# Patient Record
Sex: Female | Born: 1940 | Race: Black or African American | Hispanic: No | Smoking: Former smoker
Health system: Southern US, Community
[De-identification: ages and names within clinical notes are randomized; demographics above are authoritative.]

## PROBLEM LIST (undated history)

## (undated) DIAGNOSIS — N3010 Interstitial cystitis (chronic) without hematuria: Secondary | ICD-10-CM

## (undated) DIAGNOSIS — T8859XA Other complications of anesthesia, initial encounter: Secondary | ICD-10-CM

## (undated) DIAGNOSIS — T4145XA Adverse effect of unspecified anesthetic, initial encounter: Secondary | ICD-10-CM

## (undated) DIAGNOSIS — E162 Hypoglycemia, unspecified: Secondary | ICD-10-CM

## (undated) DIAGNOSIS — S838X2A Sprain of other specified parts of left knee, initial encounter: Secondary | ICD-10-CM

## (undated) DIAGNOSIS — N329 Bladder disorder, unspecified: Secondary | ICD-10-CM

## (undated) DIAGNOSIS — F411 Generalized anxiety disorder: Secondary | ICD-10-CM

## (undated) DIAGNOSIS — R413 Other amnesia: Secondary | ICD-10-CM

## (undated) DIAGNOSIS — I10 Essential (primary) hypertension: Secondary | ICD-10-CM

## (undated) DIAGNOSIS — R0600 Dyspnea, unspecified: Secondary | ICD-10-CM

## (undated) DIAGNOSIS — G459 Transient cerebral ischemic attack, unspecified: Secondary | ICD-10-CM

## (undated) DIAGNOSIS — G4733 Obstructive sleep apnea (adult) (pediatric): Secondary | ICD-10-CM

## (undated) DIAGNOSIS — G4730 Sleep apnea, unspecified: Secondary | ICD-10-CM

## (undated) DIAGNOSIS — K219 Gastro-esophageal reflux disease without esophagitis: Secondary | ICD-10-CM

## (undated) DIAGNOSIS — K5792 Diverticulitis of intestine, part unspecified, without perforation or abscess without bleeding: Secondary | ICD-10-CM

## (undated) DIAGNOSIS — M25462 Effusion, left knee: Secondary | ICD-10-CM

## (undated) DIAGNOSIS — F329 Major depressive disorder, single episode, unspecified: Secondary | ICD-10-CM

## (undated) DIAGNOSIS — K5900 Constipation, unspecified: Secondary | ICD-10-CM

## (undated) HISTORY — PX: TOTAL ABDOMINAL HYSTERECTOMY W/ BILATERAL SALPINGOOPHORECTOMY: SHX83

## (undated) HISTORY — DX: Major depressive disorder, single episode, unspecified: F32.9

## (undated) HISTORY — DX: Morbid (severe) obesity due to excess calories: E66.01

## (undated) HISTORY — DX: Generalized anxiety disorder: F41.1

## (undated) HISTORY — DX: Bladder disorder, unspecified: N32.9

## (undated) HISTORY — DX: Transient cerebral ischemic attack, unspecified: G45.9

## (undated) HISTORY — PX: HEMORROIDECTOMY: SUR656

## (undated) HISTORY — DX: Other amnesia: R41.3

## (undated) HISTORY — PX: TONSILLECTOMY AND ADENOIDECTOMY: SUR1326

## (undated) HISTORY — DX: Obstructive sleep apnea (adult) (pediatric): G47.33

## (undated) HISTORY — PX: LAPAROSCOPIC LYSIS OF ADHESIONS: SHX5905

---

## 1978-09-29 HISTORY — PX: LAPAROSCOPIC LYSIS OF ADHESIONS: SHX5905

## 1999-01-30 ENCOUNTER — Ambulatory Visit (HOSPITAL_COMMUNITY): Admission: RE | Admit: 1999-01-30 | Discharge: 1999-01-30 | Payer: Self-pay | Admitting: Internal Medicine

## 1999-06-08 ENCOUNTER — Ambulatory Visit (HOSPITAL_COMMUNITY): Admission: RE | Admit: 1999-06-08 | Discharge: 1999-06-08 | Payer: Self-pay | Admitting: Internal Medicine

## 1999-06-08 ENCOUNTER — Encounter: Payer: Self-pay | Admitting: Internal Medicine

## 2001-04-20 ENCOUNTER — Encounter: Admission: RE | Admit: 2001-04-20 | Discharge: 2001-04-20 | Payer: Self-pay | Admitting: Internal Medicine

## 2001-04-20 ENCOUNTER — Encounter: Payer: Self-pay | Admitting: Internal Medicine

## 2003-01-21 ENCOUNTER — Emergency Department (HOSPITAL_COMMUNITY): Admission: AD | Admit: 2003-01-21 | Discharge: 2003-01-21 | Payer: Self-pay | Admitting: Emergency Medicine

## 2003-03-22 ENCOUNTER — Emergency Department (HOSPITAL_COMMUNITY): Admission: EM | Admit: 2003-03-22 | Discharge: 2003-03-22 | Payer: Self-pay | Admitting: Family Medicine

## 2005-03-21 ENCOUNTER — Encounter: Admission: RE | Admit: 2005-03-21 | Discharge: 2005-03-21 | Payer: Self-pay | Admitting: Internal Medicine

## 2005-03-27 ENCOUNTER — Encounter: Admission: RE | Admit: 2005-03-27 | Discharge: 2005-03-27 | Payer: Self-pay | Admitting: Orthopedic Surgery

## 2007-01-29 HISTORY — PX: KNEE ARTHROSCOPY: SUR90

## 2007-01-30 ENCOUNTER — Encounter: Admission: RE | Admit: 2007-01-30 | Discharge: 2007-01-30 | Payer: Self-pay | Admitting: Internal Medicine

## 2012-07-04 DIAGNOSIS — N3010 Interstitial cystitis (chronic) without hematuria: Secondary | ICD-10-CM | POA: Insufficient documentation

## 2012-08-14 DIAGNOSIS — R1084 Generalized abdominal pain: Secondary | ICD-10-CM | POA: Insufficient documentation

## 2012-09-01 DIAGNOSIS — R1032 Left lower quadrant pain: Secondary | ICD-10-CM | POA: Insufficient documentation

## 2012-09-25 ENCOUNTER — Other Ambulatory Visit: Payer: Self-pay | Admitting: Orthopedic Surgery

## 2012-09-25 NOTE — Progress Notes (Signed)
Preoperative surgical orders have been place into the [facility] hospital system for Marissa Dodson on 09/25/2012, 12:41 PM  by Patrica Duel for surgery on 10/09/2012.  Preop Knee Scope orders including IV Tylenol and IV Decadron as long as there are no contraindications to the above medications. Avel Peace, PA-C

## 2012-10-01 ENCOUNTER — Encounter (HOSPITAL_BASED_OUTPATIENT_CLINIC_OR_DEPARTMENT_OTHER): Payer: Self-pay | Admitting: *Deleted

## 2012-10-02 ENCOUNTER — Encounter (HOSPITAL_BASED_OUTPATIENT_CLINIC_OR_DEPARTMENT_OTHER): Payer: Self-pay | Admitting: *Deleted

## 2012-10-02 DIAGNOSIS — G4700 Insomnia, unspecified: Secondary | ICD-10-CM | POA: Insufficient documentation

## 2012-10-02 NOTE — Progress Notes (Signed)
NPO AFTER MN WITH EXCEPTION CLEAR LIQUIDS UNTIL 0730 (NO CREAM/ MILK PRODUCTS). ARRIVES AT 1145. NEEDS ISTAT AND EKG. WILL TAKE PRILOSEC AM OF SURG W/ SIP OF WATER. PT WILL NOT TAKE ATENOLOL AM OF SURG DUE TO ISSUE W/ HYPOTENTION W/ ANES.Marland Kitchen  PER PT. (PT RETIRED RN)

## 2012-10-05 ENCOUNTER — Encounter (HOSPITAL_BASED_OUTPATIENT_CLINIC_OR_DEPARTMENT_OTHER): Payer: Self-pay | Admitting: *Deleted

## 2012-10-05 NOTE — Progress Notes (Signed)
10/02/12 1009  OBSTRUCTIVE SLEEP APNEA  Have you ever been diagnosed with sleep apnea through a sleep study? No  Do you snore loudly (loud enough to be heard through closed doors)?  1  Do you often feel tired, fatigued, or sleepy during the daytime? 1  Has anyone observed you stop breathing during your sleep? 0  Do you have, or are you being treated for high blood pressure? 1  BMI more than 35 kg/m2? 1  Age over 71 years old? 1  Gender: 0  Obstructive Sleep Apnea Score 5

## 2012-10-08 DIAGNOSIS — S83249A Other tear of medial meniscus, current injury, unspecified knee, initial encounter: Secondary | ICD-10-CM

## 2012-10-08 NOTE — H&P (Signed)
  CC- Marissa Dodson is a 72 y.o. female who presents with left knee pain.  HPI- . Knee Pain: Patient presents with knee pain involving the  left knee. Onset of the symptoms was several weeks ago. Inciting event: twisting injury while getting out of car. Current symptoms include giving out, pain located medially and stiffness. Pain is aggravated by kneeling, lateral movements, pivoting, rising after sitting and squatting.  Patient has had no prior knee problems. Evaluation to date: MRI: abnormal medial meniscal tear. Treatment to date: corticosteroid injection which was ineffective.  Past Medical History  Diagnosis Date  . Acute meniscal injury of left knee   . Hypertension   . Constipation   . IC (interstitial cystitis)   . Swelling of left knee joint   . Complication of anesthesia     hypotention  . GERD (gastroesophageal reflux disease)   . Hypoglycemia, unspecified   . Sleep apnea     STOP-Bang =  5    Past Surgical History  Procedure Laterality Date  . Knee arthroscopy Right 2009  . Total abdominal hysterectomy w/ bilateral salpingoophorectomy  AGE 24  . Laparoscopic lysis of adhesions  1980'S  . Tonsillectomy and adenoidectomy  AGE 24    Prior to Admission medications   Medication Sig Start Date End Date Taking? Authorizing Provider  amitriptyline (ELAVIL) 25 MG tablet Take 50 mg by mouth at bedtime.   Yes Historical Provider, MD  aspirin 81 MG tablet Take 81 mg by mouth daily.   Yes Historical Provider, MD  atenolol (TENORMIN) 25 MG tablet Take 25 mg by mouth every morning.   Yes Historical Provider, MD  CASCARA SAGRADA PO Take by mouth daily.   Yes Historical Provider, MD  cetirizine (ZYRTEC) 10 MG tablet Take 10 mg by mouth daily.   Yes Historical Provider, MD  Cholecalciferol (VITAMIN D3) 2000 UNITS TABS Take 1 capsule by mouth daily.   Yes Historical Provider, MD  dicyclomine (BENTYL) 10 MG capsule Take 10 mg by mouth 3 (three) times daily as needed.   Yes Historical  Provider, MD  furosemide (LASIX) 40 MG tablet Take 20-40 mg by mouth daily. Takes 20mg  daily if increased fluid retention takes 40mg    Yes Historical Provider, MD  ibuprofen (ADVIL,MOTRIN) 200 MG tablet Take 600 mg by mouth 2 (two) times daily. And prn   Yes Historical Provider, MD  omeprazole (PRILOSEC) 40 MG capsule Take 40 mg by mouth every morning.   Yes Historical Provider, MD  potassium chloride SA (K-DUR,KLOR-CON) 20 MEQ tablet Take 10-20 mEq by mouth daily. Takes daily and if taking 40mg  lasix will take   Yes Historical Provider, MD   Knee Exam antalgic gait, soft tissue tenderness over medial joint line, no effusion, exam limited by acuity of pain, negative pivot-shift, collateral ligaments intact  Physical Examination: General appearance - alert, well appearing, and in no distress Mental status - alert, oriented to person, place, and time Chest - clear to auscultation, no wheezes, rales or rhonchi, symmetric air entry Heart - normal rate, regular rhythm, normal S1, S2, no murmurs, rubs, clicks or gallops Abdomen - soft, nontender, nondistended, no masses or organomegaly Musculoskeletal - no joint tenderness, deformity or swelling   Asessment/Plan--- Left knee medial meniscal tear- - Plan left knee arthroscopy with meniscal debridement. Procedure risks and potential comps discussed with patient who elects to proceed. Goals are decreased pain and increased function with a high likelihood of achieving both

## 2012-10-09 ENCOUNTER — Ambulatory Visit (HOSPITAL_BASED_OUTPATIENT_CLINIC_OR_DEPARTMENT_OTHER)
Admission: RE | Admit: 2012-10-09 | Discharge: 2012-10-09 | Disposition: A | Discharge status: Home / Self Care | Payer: Medicare Other | Source: Ambulatory Visit | Attending: Orthopedic Surgery | Admitting: Orthopedic Surgery

## 2012-10-09 ENCOUNTER — Encounter (HOSPITAL_BASED_OUTPATIENT_CLINIC_OR_DEPARTMENT_OTHER): Payer: Self-pay | Admitting: *Deleted

## 2012-10-09 ENCOUNTER — Other Ambulatory Visit: Payer: Self-pay

## 2012-10-09 ENCOUNTER — Encounter (HOSPITAL_BASED_OUTPATIENT_CLINIC_OR_DEPARTMENT_OTHER)
Admission: RE | Disposition: A | Discharge status: Home / Self Care | Payer: Self-pay | Source: Ambulatory Visit | Attending: Orthopedic Surgery

## 2012-10-09 ENCOUNTER — Ambulatory Visit (HOSPITAL_BASED_OUTPATIENT_CLINIC_OR_DEPARTMENT_OTHER): Payer: Medicare Other | Admitting: Anesthesiology

## 2012-10-09 ENCOUNTER — Encounter (HOSPITAL_BASED_OUTPATIENT_CLINIC_OR_DEPARTMENT_OTHER): Payer: Self-pay | Admitting: Anesthesiology

## 2012-10-09 DIAGNOSIS — Z7982 Long term (current) use of aspirin: Secondary | ICD-10-CM | POA: Insufficient documentation

## 2012-10-09 DIAGNOSIS — S83242A Other tear of medial meniscus, current injury, left knee, initial encounter: Secondary | ICD-10-CM

## 2012-10-09 DIAGNOSIS — Z79899 Other long term (current) drug therapy: Secondary | ICD-10-CM | POA: Insufficient documentation

## 2012-10-09 DIAGNOSIS — Z01818 Encounter for other preprocedural examination: Secondary | ICD-10-CM | POA: Insufficient documentation

## 2012-10-09 DIAGNOSIS — Z01810 Encounter for preprocedural cardiovascular examination: Secondary | ICD-10-CM | POA: Insufficient documentation

## 2012-10-09 DIAGNOSIS — Z01812 Encounter for preprocedural laboratory examination: Secondary | ICD-10-CM | POA: Insufficient documentation

## 2012-10-09 DIAGNOSIS — M2240 Chondromalacia patellae, unspecified knee: Secondary | ICD-10-CM | POA: Insufficient documentation

## 2012-10-09 DIAGNOSIS — E162 Hypoglycemia, unspecified: Secondary | ICD-10-CM | POA: Insufficient documentation

## 2012-10-09 DIAGNOSIS — X500XXA Overexertion from strenuous movement or load, initial encounter: Secondary | ICD-10-CM | POA: Insufficient documentation

## 2012-10-09 DIAGNOSIS — S83249A Other tear of medial meniscus, current injury, unspecified knee, initial encounter: Secondary | ICD-10-CM

## 2012-10-09 DIAGNOSIS — IMO0002 Reserved for concepts with insufficient information to code with codable children: Secondary | ICD-10-CM | POA: Insufficient documentation

## 2012-10-09 HISTORY — DX: Hypoglycemia, unspecified: E16.2

## 2012-10-09 HISTORY — DX: Adverse effect of unspecified anesthetic, initial encounter: T41.45XA

## 2012-10-09 HISTORY — PX: KNEE ARTHROSCOPY: SHX127

## 2012-10-09 HISTORY — DX: Constipation, unspecified: K59.00

## 2012-10-09 HISTORY — DX: Sleep apnea, unspecified: G47.30

## 2012-10-09 HISTORY — DX: Sprain of other specified parts of left knee, initial encounter: S83.8X2A

## 2012-10-09 HISTORY — DX: Essential (primary) hypertension: I10

## 2012-10-09 HISTORY — DX: Interstitial cystitis (chronic) without hematuria: N30.10

## 2012-10-09 HISTORY — DX: Other complications of anesthesia, initial encounter: T88.59XA

## 2012-10-09 HISTORY — DX: Effusion, left knee: M25.462

## 2012-10-09 HISTORY — DX: Gastro-esophageal reflux disease without esophagitis: K21.9

## 2012-10-09 LAB — POCT I-STAT 4, (NA,K, GLUC, HGB,HCT)
HCT: 35 % — ABNORMAL LOW (ref 36.0–46.0)
Hemoglobin: 11.9 g/dL — ABNORMAL LOW (ref 12.0–15.0)

## 2012-10-09 SURGERY — ARTHROSCOPY, KNEE
Anesthesia: General | Site: Knee | Laterality: Left | Wound class: Clean

## 2012-10-09 MED ORDER — LACTATED RINGERS IV SOLN
INTRAVENOUS | Status: DC
  Filled 2012-10-09: qty 1000

## 2012-10-09 MED ORDER — DEXTROSE 5 % IV SOLN
3.0000 g | INTRAVENOUS | Status: DC
  Filled 2012-10-09: qty 3000

## 2012-10-09 MED ORDER — SODIUM CHLORIDE 0.9 % IR SOLN
Status: DC | PRN
  Administered 2012-10-09: 9000 mL

## 2012-10-09 MED ORDER — BUPIVACAINE-EPINEPHRINE 0.25% -1:200000 IJ SOLN
INTRAMUSCULAR | Status: DC | PRN
  Administered 2012-10-09: 20 mL

## 2012-10-09 MED ORDER — METHOCARBAMOL 500 MG PO TABS: 500.0000 mg | ORAL_TABLET | Freq: Four times a day (QID) | ORAL | Status: DC

## 2012-10-09 MED ORDER — LIDOCAINE HCL (CARDIAC) 20 MG/ML IV SOLN
INTRAVENOUS | Status: DC | PRN
  Administered 2012-10-09: 100 mg via INTRAVENOUS

## 2012-10-09 MED ORDER — MIDAZOLAM HCL 5 MG/5ML IJ SOLN
INTRAMUSCULAR | Status: DC | PRN
  Administered 2012-10-09 (×2): 1 mg via INTRAVENOUS

## 2012-10-09 MED ORDER — CHLORHEXIDINE GLUCONATE 4 % EX LIQD
60.0000 mL | Freq: Once | CUTANEOUS | Status: AC
  Administered 2012-10-09: 4 via TOPICAL
  Filled 2012-10-09: qty 60

## 2012-10-09 MED ORDER — DEXAMETHASONE SODIUM PHOSPHATE 10 MG/ML IJ SOLN
10.0000 mg | Freq: Once | INTRAMUSCULAR | Status: AC
  Administered 2012-10-09: 10 mg via INTRAVENOUS
  Filled 2012-10-09: qty 1

## 2012-10-09 MED ORDER — OXYCODONE HCL 5 MG PO TABS: 5.0000 mg | ORAL_TABLET | ORAL | Status: DC | PRN

## 2012-10-09 MED ORDER — FENTANYL CITRATE 0.05 MG/ML IJ SOLN
25.0000 ug | INTRAMUSCULAR | Status: DC | PRN
  Administered 2012-10-09 (×4): 25 ug via INTRAVENOUS
  Filled 2012-10-09: qty 1

## 2012-10-09 MED ORDER — FENTANYL CITRATE 0.05 MG/ML IJ SOLN
INTRAMUSCULAR | Status: DC | PRN
  Administered 2012-10-09 (×3): 50 ug via INTRAVENOUS

## 2012-10-09 MED ORDER — METOPROLOL TARTRATE 1 MG/ML IV SOLN
INTRAVENOUS | Status: DC | PRN
  Administered 2012-10-09: 2 mg via INTRAVENOUS
  Administered 2012-10-09: 3 mg via INTRAVENOUS

## 2012-10-09 MED ORDER — PROPOFOL 10 MG/ML IV BOLUS
INTRAVENOUS | Status: DC | PRN
  Administered 2012-10-09: 200 mg via INTRAVENOUS

## 2012-10-09 MED ORDER — ACETAMINOPHEN 10 MG/ML IV SOLN
1000.0000 mg | Freq: Once | INTRAVENOUS | Status: AC
  Administered 2012-10-09: 1000 mg via INTRAVENOUS
  Filled 2012-10-09: qty 100

## 2012-10-09 MED ORDER — SODIUM CHLORIDE 0.9 % IV SOLN
INTRAVENOUS | Status: DC
  Filled 2012-10-09: qty 1000

## 2012-10-09 MED ORDER — METOCLOPRAMIDE HCL 5 MG/ML IJ SOLN
INTRAMUSCULAR | Status: DC | PRN
  Administered 2012-10-09: 10 mg via INTRAVENOUS

## 2012-10-09 MED ORDER — VANCOMYCIN HCL IN DEXTROSE 1-5 GM/200ML-% IV SOLN
1000.0000 mg | Freq: Once | INTRAVENOUS | Status: AC
  Administered 2012-10-09: 1000 mg via INTRAVENOUS
  Filled 2012-10-09: qty 200

## 2012-10-09 MED ORDER — METHOCARBAMOL 500 MG PO TABS
500.0000 mg | ORAL_TABLET | Freq: Four times a day (QID) | ORAL | Status: DC | PRN
  Administered 2012-10-09: 500 mg via ORAL
  Filled 2012-10-09: qty 1

## 2012-10-09 MED ORDER — LACTATED RINGERS IV SOLN
INTRAVENOUS | Status: DC
  Administered 2012-10-09 (×2): via INTRAVENOUS
  Filled 2012-10-09: qty 1000

## 2012-10-09 MED ORDER — ONDANSETRON HCL 4 MG/2ML IJ SOLN
INTRAMUSCULAR | Status: DC | PRN
  Administered 2012-10-09: 4 mg via INTRAVENOUS

## 2012-10-09 SURGICAL SUPPLY — 36 items
BANDAGE ELASTIC 6 VELCRO ST LF (GAUZE/BANDAGES/DRESSINGS) ×2 IMPLANT
BLADE 4.2CUDA (BLADE) ×2 IMPLANT
BLADE CUDA SHAVER 3.5 (BLADE) IMPLANT
BLADE CUTTER GATOR 3.5 (BLADE) IMPLANT
CANISTER SUCT LVC 12 LTR MEDI- (MISCELLANEOUS) ×2 IMPLANT
CANISTER SUCTION 2500CC (MISCELLANEOUS) IMPLANT
CLOTH BEACON ORANGE TIMEOUT ST (SAFETY) ×2 IMPLANT
CUFF TOURN SGL QUICK 34 (TOURNIQUET CUFF) ×1
CUFF TRNQT CYL 34X4X40X1 (TOURNIQUET CUFF) ×1 IMPLANT
DRAPE ARTHROSCOPY W/POUCH 114 (DRAPES) ×2 IMPLANT
DRSG EMULSION OIL 3X3 NADH (GAUZE/BANDAGES/DRESSINGS) ×2 IMPLANT
DURAPREP 26ML APPLICATOR (WOUND CARE) ×2 IMPLANT
ELECT MENISCUS 165MM 90D (ELECTRODE) IMPLANT
ELECT REM PT RETURN 9FT ADLT (ELECTROSURGICAL)
ELECTRODE REM PT RTRN 9FT ADLT (ELECTROSURGICAL) IMPLANT
GLOVE BIO SURGEON STRL SZ8 (GLOVE) ×2 IMPLANT
GLOVE BIOGEL PI IND STRL 7.5 (GLOVE) ×3 IMPLANT
GLOVE BIOGEL PI INDICATOR 7.5 (GLOVE) ×3
GLOVE INDICATOR 8.0 STRL GRN (GLOVE) ×2 IMPLANT
GOWN PREVENTION PLUS LG XLONG (DISPOSABLE) ×4 IMPLANT
IV NS IRRIG 3000ML ARTHROMATIC (IV SOLUTION) ×6 IMPLANT
KNEE WRAP E Z 3 GEL PACK (MISCELLANEOUS) ×2 IMPLANT
PACK ARTHROSCOPY DSU (CUSTOM PROCEDURE TRAY) ×2 IMPLANT
PACK BASIN DAY SURGERY FS (CUSTOM PROCEDURE TRAY) ×2 IMPLANT
PADDING CAST ABS 4INX4YD NS (CAST SUPPLIES) ×1
PADDING CAST ABS COTTON 4X4 ST (CAST SUPPLIES) ×1 IMPLANT
PADDING CAST COTTON 6X4 STRL (CAST SUPPLIES) ×2 IMPLANT
PENCIL BUTTON HOLSTER BLD 10FT (ELECTRODE) IMPLANT
SET ARTHROSCOPY TUBING (MISCELLANEOUS) ×1
SET ARTHROSCOPY TUBING LN (MISCELLANEOUS) ×1 IMPLANT
SPONGE GAUZE 4X4 12PLY (GAUZE/BANDAGES/DRESSINGS) ×2 IMPLANT
SUT ETHILON 4 0 PS 2 18 (SUTURE) ×2 IMPLANT
TOWEL OR 17X24 6PK STRL BLUE (TOWEL DISPOSABLE) ×2 IMPLANT
WAND 30 DEG SABER W/CORD (SURGICAL WAND) IMPLANT
WAND 90 DEG TURBOVAC W/CORD (SURGICAL WAND) IMPLANT
WATER STERILE IRR 500ML POUR (IV SOLUTION) ×2 IMPLANT

## 2012-10-09 NOTE — Op Note (Signed)
Preoperative diagnosis-  Left knee medial meniscal tear  Postoperative diagnosis Left- knee medial meniscal tear   Procedure- Left knee arthroscopy with medial meniscal debridement    Surgeon- Gus Rankin. Savaya Hakes, MD  Anesthesia-General  EBL-  minimal Complications- None  Condition- PACU - hemodynamically stable.  Brief clinical note- -[name] is a 72 y.o.  female with a several week history of left knee pain and mechanical symptoms. Exam and history suggested medial meniscal tear confirmed by MRI. The patient presents now for arthroscopy and debridement   Procedure in detail -       After successful administration of General anesthetic, a tourmiquet is placed high on the Left  thigh and the Left lower extremity is prepped and draped in the usual sterile fashion. Time out is performed by the surgical team. Standard superomedial and inferolateral portal sites are marked and incisions made with an 11 blade. The inflow cannula is passed through the superomedial portal and camera through the inferolateral portal and inflow is initiated. Arthroscopic visualization proceeds.      The undersurface of the patella and trochlea are visualized and there is mild chondromalacia but no chondral defect. The medial and lateral gutters are visualized and there are  no loose bodies. Flexion and valgus force is applied to the knee and the medial compartment is entered. A spinal needle is passed into the joint through the site marked for the inferomedial portal. A small incision is made and the dilator passed into the joint. The findings for the medial compartment are grade II chondromalacia medial femoral condyle without any unstable defects and an unstable tear of the body and posterior horn of the medial meniscus. The tear is debrided to a stable base with baskets and a shaver and sealed off with the Arthrocare.      The intercondylar notch is visualized and the ACL appears normal. The lateral  compartment is entered and the findings are normal .      The joint is again inspected and there are no other tears, defects or loose bodies identified. The arthroscopic equipment is then removed from the inferior portals which are closed with interrupted 4-0 nylon. 20 ml of .25% Marcaine with epinephrine are injected through the inflow cannula and the cannula is then removed and the portal closed with nylon. The incisions are cleaned and dried and a bulky sterile dressing is applied. The patient is then awakened and transported to recovery in stable condition.   10/09/2012, 2:03 PM

## 2012-10-09 NOTE — Anesthesia Preprocedure Evaluation (Signed)
Anesthesia Evaluation  Patient identified by MRN, date of birth, ID band Patient awake    Reviewed: Allergy & Precautions, H&P , NPO status , Patient's Chart, lab work & pertinent test results  History of Anesthesia Complications (+) PONV  Airway Mallampati: II TM Distance: >3 FB Neck ROM: Full    Dental  (+) Teeth Intact and Dental Advisory Given   Pulmonary neg pulmonary ROS,  breath sounds clear to auscultation  Pulmonary exam normal       Cardiovascular hypertension, Pt. on medications and Pt. on home beta blockers negative cardio ROS  Rhythm:Regular Rate:Normal     Neuro/Psych negative neurological ROS  negative psych ROS   GI/Hepatic Neg liver ROS, GERD-  ,  Endo/Other  negative endocrine ROS  Renal/GU negative Renal ROS  negative genitourinary   Musculoskeletal negative musculoskeletal ROS (+)   Abdominal   Peds  Hematology negative hematology ROS (+)   Anesthesia Other Findings   Reproductive/Obstetrics                           Anesthesia Physical Anesthesia Plan  ASA: II  Anesthesia Plan: General   Post-op Pain Management:    Induction: Intravenous  Airway Management Planned:   Additional Equipment:   Intra-op Plan:   Post-operative Plan: Extubation in OR  Informed Consent: I have reviewed the patients History and Physical, chart, labs and discussed the procedure including the risks, benefits and alternatives for the proposed anesthesia with the patient or authorized representative who has indicated his/her understanding and acceptance.   Dental advisory given  Plan Discussed with: CRNA  Anesthesia Plan Comments:         Anesthesia Quick Evaluation

## 2012-10-09 NOTE — Anesthesia Procedure Notes (Signed)
Procedure Name: Intubation Date/Time: 10/09/2012 1:20 PM Performed by: Norva Pavlov Pre-anesthesia Checklist: Patient identified, Emergency Drugs available, Suction available and Patient being monitored Patient Re-evaluated:Patient Re-evaluated prior to inductionOxygen Delivery Method: Circle System Utilized Preoxygenation: Pre-oxygenation with 100% oxygen Intubation Type: IV induction Ventilation: Mask ventilation without difficulty Laryngoscope Size: Mac and 4 Grade View: Grade II Tube type: Oral Tube size: 7.0 mm Number of attempts: 2 Airway Equipment and Method: stylet Placement Confirmation: ETT inserted through vocal cords under direct vision,  positive ETCO2 and breath sounds checked- equal and bilateral Secured at: 22 cm Tube secured with: Tape Dental Injury: Teeth and Oropharynx as per pre-operative assessment

## 2012-10-09 NOTE — Anesthesia Postprocedure Evaluation (Signed)
  Anesthesia Post-op Note  Patient: Marissa Dodson  Procedure(s) Performed: Procedure(s) (LRB): LEFT ARTHROSCOPY KNEE WITH medial meiscal DEBRIDEMENT (Left)  Patient Location: PACU  Anesthesia Type: General  Level of Consciousness: awake and alert   Airway and Oxygen Therapy: Patient Spontanous Breathing  Post-op Pain: mild  Post-op Assessment: Post-op Vital signs reviewed, Patient's Cardiovascular Status Stable, Respiratory Function Stable, Patent Airway and No signs of Nausea or vomiting  Last Vitals:  Filed Vitals:   10/09/12 1141  BP: 151/89  Pulse: 104  Temp: 36.2 C  Resp: 18    Post-op Vital Signs: stable   Complications: No apparent anesthesia complications

## 2012-10-09 NOTE — Interval H&P Note (Signed)
History and Physical Interval Note:  10/09/2012 1:01 PM  Marissa Dodson  has presented today for surgery, with the diagnosis of LEFT KNEE MEDIAL MENISCAL TEAR  The various methods of treatment have been discussed with the patient and family. After consideration of risks, benefits and other options for treatment, the patient has consented to  Procedure(s): LEFT ARTHROSCOPY KNEE WITH DEBRIDEMENT (Left) as a surgical intervention .  The patient's history has been reviewed, patient examined, no change in status, stable for surgery.  I have reviewed the patient's chart and labs.  Questions were answered to the patient's satisfaction.     Loanne Drilling

## 2012-10-09 NOTE — Transfer of Care (Signed)
Immediate Anesthesia Transfer of Care Note  Patient: Marissa Dodson  Procedure(s) Performed: Procedure(s) (LRB): LEFT ARTHROSCOPY KNEE WITH medial meiscal DEBRIDEMENT (Left)  Patient Location: PACU  Anesthesia Type: General  Level of Consciousness: awake, alert  and oriented  Airway & Oxygen Therapy: Patient Spontanous Breathing and Patient connected to face mask oxygen  Post-op Assessment: Report given to PACU RN and Post -op Vital signs reviewed and stable  Post vital signs: Reviewed and stable  Complications: No apparent anesthesia complications

## 2012-10-12 ENCOUNTER — Encounter (HOSPITAL_BASED_OUTPATIENT_CLINIC_OR_DEPARTMENT_OTHER): Payer: Self-pay | Admitting: Orthopedic Surgery

## 2012-10-16 DIAGNOSIS — R609 Edema, unspecified: Secondary | ICD-10-CM | POA: Insufficient documentation

## 2012-10-16 DIAGNOSIS — I10 Essential (primary) hypertension: Secondary | ICD-10-CM

## 2012-10-16 HISTORY — DX: Essential (primary) hypertension: I10

## 2012-10-23 ENCOUNTER — Ambulatory Visit: Payer: Medicare Other | Attending: Physician Assistant | Admitting: Rehabilitation

## 2012-10-23 DIAGNOSIS — IMO0001 Reserved for inherently not codable concepts without codable children: Secondary | ICD-10-CM | POA: Insufficient documentation

## 2012-10-23 DIAGNOSIS — R262 Difficulty in walking, not elsewhere classified: Secondary | ICD-10-CM | POA: Insufficient documentation

## 2012-10-23 DIAGNOSIS — M25569 Pain in unspecified knee: Secondary | ICD-10-CM | POA: Insufficient documentation

## 2012-10-27 ENCOUNTER — Ambulatory Visit: Payer: Medicare Other | Admitting: Rehabilitation

## 2012-10-28 ENCOUNTER — Ambulatory Visit: Payer: Medicare Other | Admitting: Rehabilitation

## 2012-11-02 ENCOUNTER — Ambulatory Visit: Payer: Medicare Other | Attending: Physician Assistant | Admitting: Rehabilitation

## 2012-11-02 DIAGNOSIS — IMO0001 Reserved for inherently not codable concepts without codable children: Secondary | ICD-10-CM | POA: Insufficient documentation

## 2012-11-02 DIAGNOSIS — M25569 Pain in unspecified knee: Secondary | ICD-10-CM | POA: Insufficient documentation

## 2012-11-02 DIAGNOSIS — R262 Difficulty in walking, not elsewhere classified: Secondary | ICD-10-CM | POA: Insufficient documentation

## 2012-11-05 ENCOUNTER — Ambulatory Visit: Payer: Medicare Other | Admitting: Rehabilitation

## 2012-11-09 ENCOUNTER — Ambulatory Visit: Payer: Medicare Other | Admitting: Rehabilitation

## 2012-11-12 ENCOUNTER — Ambulatory Visit: Payer: Medicare Other | Admitting: Rehabilitation

## 2012-11-16 ENCOUNTER — Ambulatory Visit: Payer: Medicare Other | Admitting: Rehabilitation

## 2012-11-19 ENCOUNTER — Ambulatory Visit: Payer: Medicare Other | Admitting: Rehabilitation

## 2012-11-23 ENCOUNTER — Ambulatory Visit: Payer: Medicare Other | Admitting: Rehabilitation

## 2012-11-26 ENCOUNTER — Ambulatory Visit: Payer: Medicare Other | Admitting: Rehabilitation

## 2012-11-30 ENCOUNTER — Ambulatory Visit: Payer: Medicare Other | Attending: Physician Assistant | Admitting: Rehabilitation

## 2012-11-30 DIAGNOSIS — R262 Difficulty in walking, not elsewhere classified: Secondary | ICD-10-CM | POA: Insufficient documentation

## 2012-11-30 DIAGNOSIS — M25569 Pain in unspecified knee: Secondary | ICD-10-CM | POA: Insufficient documentation

## 2012-11-30 DIAGNOSIS — IMO0001 Reserved for inherently not codable concepts without codable children: Secondary | ICD-10-CM | POA: Insufficient documentation

## 2013-03-06 DIAGNOSIS — J069 Acute upper respiratory infection, unspecified: Secondary | ICD-10-CM | POA: Insufficient documentation

## 2013-03-11 DIAGNOSIS — M1990 Unspecified osteoarthritis, unspecified site: Secondary | ICD-10-CM | POA: Insufficient documentation

## 2013-03-11 DIAGNOSIS — K219 Gastro-esophageal reflux disease without esophagitis: Secondary | ICD-10-CM | POA: Insufficient documentation

## 2013-04-14 DIAGNOSIS — K921 Melena: Secondary | ICD-10-CM | POA: Insufficient documentation

## 2013-04-14 DIAGNOSIS — K644 Residual hemorrhoidal skin tags: Secondary | ICD-10-CM | POA: Insufficient documentation

## 2013-04-14 DIAGNOSIS — K5732 Diverticulitis of large intestine without perforation or abscess without bleeding: Secondary | ICD-10-CM | POA: Insufficient documentation

## 2013-07-07 ENCOUNTER — Encounter (HOSPITAL_COMMUNITY): Payer: Self-pay | Admitting: Emergency Medicine

## 2013-07-07 ENCOUNTER — Emergency Department (INDEPENDENT_AMBULATORY_CARE_PROVIDER_SITE_OTHER)
Admission: EM | Admit: 2013-07-07 | Discharge: 2013-07-07 | Disposition: A | Discharge status: Home / Self Care | Payer: Medicare Other | Source: Home / Self Care | Attending: Emergency Medicine | Admitting: Emergency Medicine

## 2013-07-07 DIAGNOSIS — J019 Acute sinusitis, unspecified: Secondary | ICD-10-CM

## 2013-07-07 MED ORDER — AMOXICILLIN-POT CLAVULANATE 875-125 MG PO TABS: 1.0000 | ORAL_TABLET | Freq: Two times a day (BID) | ORAL | Status: DC

## 2013-07-07 MED ORDER — FLUTICASONE PROPIONATE 50 MCG/ACT NA SUSP: 2.0000 | Freq: Every day | NASAL | Status: DC

## 2013-07-07 NOTE — ED Notes (Signed)
Pt  Reports  The  Symptoms  Of  A  Sinus  Infection  To  Include  Facial  Pain         Sinus  Congestion as  Well  As  A  headache   And     A  Cough  With  The  Symptoms  X  2  Weeks

## 2013-07-07 NOTE — Discharge Instructions (Signed)

## 2013-07-07 NOTE — ED Provider Notes (Signed)
  Chief Complaint   Chief Complaint  Patient presents with  . 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 She's had cough, sore throat, has felt feverish, and chills. She has a history of allergies. Recently she's been exposed to a dog and carpet.  Review of Systems   Other than as noted above, the patient denies any of the following symptoms: Systemic:  No fevers, chills, sweats, or myalgias. Eye:  No redness or discharge. ENT:  No ear pain, headache, nasal congestion, drainage, sinus pressure, or sore throat. Neck:  No neck pain, stiffness, or swollen glands. Lungs:  No cough, sputum production, hemoptysis, wheezing, chest tightness, shortness of breath or chest pain. GI:  No abdominal pain, nausea, vomiting or diarrhea.  PMFSH   Past medical history, family history, social history, meds, and allergies were reviewed. She is allergic to codeine. She has interstitial cystitis and high blood pressure. She takes atenolol, furosemide, potassium, and amitriptyline.  Physical exam   Vital signs:  BP 124/76  Pulse 84  Temp(Src) 98.6 F (37 C) (Oral)  Resp 18  SpO2 100% General:  Alert and oriented.  In no distress.  Skin warm and dry. Eye:  No conjunctival injection or drainage. Lids were normal. ENT:  TMs and canals were normal, without erythema or inflammation.  Nasal mucosa was clear and uncongested, without drainage.  Mucous membranes were moist.  Pharynx was clear with no exudate or drainage.  There were no oral ulcerations or lesions. Neck:  Supple, no adenopathy, tenderness or mass. Lungs:  No respiratory distress.  Lungs were clear to auscultation, without wheezes, rales or rhonchi.  Breath sounds were clear and equal bilaterally.  Heart:  Regular rhythm, without gallops,  murmers or rubs. Skin:  Clear, warm, and dry, without rash or lesions.  Assessment     The encounter diagnosis was Acute sinusitis.  Plan    1.  Meds:  The following meds were prescribed:   Discharge Medication List as of 07/07/2013  3:14 PM    START taking these medications   Details  amoxicillin-clavulanate (AUGMENTIN) 875-125 MG per tablet Take 1 tablet by mouth 2 (two) times daily., Starting 07/07/2013, Until Discontinued, Normal    fluticasone (FLONASE) 50 MCG/ACT nasal spray Place 2 sprays into both nostrils daily., Starting 07/07/2013, Until Discontinued, Normal        2.  Patient Education/Counseling:  The patient was given appropriate handouts, self care instructions, and instructed in symptomatic relief.  Instructed to get extra fluids, rest, and use a cool mist vaporizer.    3.  Follow up:  The patient was told to follow up here if no better in 3 to 4 days, or sooner if becoming worse in any way, and given some red flag symptoms such as increasing fever, difficulty breathing, chest pain, or persistent vomiting which would prompt immediate return.  Follow up here as needed.      Reuben Likes, MD 07/07/13 [phone]

## 2013-08-24 ENCOUNTER — Emergency Department (HOSPITAL_COMMUNITY)
Admission: EM | Admit: 2013-08-24 | Discharge: 2013-08-24 | Disposition: A | Discharge status: Home / Self Care | Payer: Medicare Other | Source: Home / Self Care | Attending: Family Medicine | Admitting: Family Medicine

## 2013-08-24 ENCOUNTER — Encounter (HOSPITAL_COMMUNITY): Payer: Self-pay | Admitting: Emergency Medicine

## 2013-08-24 DIAGNOSIS — M25532 Pain in left wrist: Secondary | ICD-10-CM

## 2013-08-24 DIAGNOSIS — Z862 Personal history of diseases of the blood and blood-forming organs and certain disorders involving the immune mechanism: Secondary | ICD-10-CM

## 2013-08-24 DIAGNOSIS — Z8739 Personal history of other diseases of the musculoskeletal system and connective tissue: Secondary | ICD-10-CM

## 2013-08-24 DIAGNOSIS — Z8639 Personal history of other endocrine, nutritional and metabolic disease: Secondary | ICD-10-CM

## 2013-08-24 DIAGNOSIS — M654 Radial styloid tenosynovitis [de Quervain]: Secondary | ICD-10-CM

## 2013-08-24 DIAGNOSIS — M25539 Pain in unspecified wrist: Secondary | ICD-10-CM

## 2013-08-24 MED ORDER — TRAMADOL HCL 50 MG PO TABS: 50.0000 mg | ORAL_TABLET | Freq: Four times a day (QID) | ORAL | Status: DC | PRN

## 2013-08-24 MED ORDER — KETOROLAC TROMETHAMINE 60 MG/2ML IM SOLN
60.0000 mg | Freq: Once | INTRAMUSCULAR | Status: AC
  Administered 2013-08-24: 60 mg via INTRAMUSCULAR

## 2013-08-24 MED ORDER — METHYLPREDNISOLONE ACETATE 80 MG/ML IJ SUSP
INTRAMUSCULAR | Status: AC
  Filled 2013-08-24: qty 1

## 2013-08-24 MED ORDER — INDOMETHACIN 50 MG PO CAPS: 50.0000 mg | ORAL_CAPSULE | Freq: Three times a day (TID) | ORAL | Status: DC | PRN

## 2013-08-24 MED ORDER — METHYLPREDNISOLONE ACETATE 40 MG/ML IJ SUSP
80.0000 mg | Freq: Once | INTRAMUSCULAR | Status: AC
  Administered 2013-08-24: 80 mg via INTRAMUSCULAR

## 2013-08-24 MED ORDER — KETOROLAC TROMETHAMINE 60 MG/2ML IM SOLN
INTRAMUSCULAR | Status: AC
  Filled 2013-08-24: qty 2

## 2013-08-24 MED ORDER — COLCHICINE 0.6 MG PO TABS: 0.6000 mg | ORAL_TABLET | Freq: Two times a day (BID) | ORAL | Status: DC | PRN

## 2013-08-24 NOTE — Discharge Instructions (Signed)
De Quervain's Tenosynovitis De Quervain's tenosynovitis involves inflammation of one or two tendon linings (sheaths) or strain of one or two tendons to the thumb: extensor pollicis brevis (EPB), or abductor pollicis longus (APL). This causes pain on the side of the wrist and base of the thumb. Tendon sheaths secrete a fluid that lubricates the tendon, allowing the tendon to move smoothly. When the sheath becomes inflamed, the tendon cannot move freely in the sheath. Both the EPB and APL tendons are important for proper use of the hand. The EPB tendon is important for straightening the thumb. The APL tendon is important for moving the thumb away from the index finger (abducting). The two tendons pass through a small tube (canal) in the wrist, near the base of the thumb. When the tendons become inflamed, pain is usually felt in this area. SYMPTOMS   Pain, tenderness, swelling, warmth, or redness over the base of the thumb and thumb side of the wrist.  Pain that gets worse when straightening the thumb.  Pain that gets worse when moving the thumb away from the index finger, against resistance.  Pain with pinching or gripping.  Locking or catching of the thumb.  Limited motion of the thumb.  Crackling sound (crepitation) when the tendon or thumb is moved or touched.  Fluid-filled cyst in the area of the base of the thumb. CAUSES   Tenosynovitis is often linked with overuse of the wrist.  Tenosynovitis may be caused by repeated injury to the thumb muscle and tendon units, and with repeated motions of the hand and wrist, due to friction of the tendon within the lining (sheath).  Tenosynovitis may also be due to a sudden increase in activity or change in activity. RISK INCREASES WITH:  Sports that involve repeated hand and wrist motions (golf, bowling, tennis, squash, racquetball).  Heavy labor.  Poor physical wrist strength and flexibility.  Failure to warm up properly before practice or  play.  Female gender.  New mothers who hold their baby's head for long periods or lift infants with thumbs in the infant's armpit (axilla). PREVENTION  Warm up and stretch properly before practice or competition.  Allow enough time for rest and recovery between practices and competition.  Maintain appropriate conditioning:  Cardiovascular fitness.  Forearm, wrist, and hand flexibility.  Muscle strength and endurance.  Use proper exercise technique. PROGNOSIS  This condition is usually curable within 6 weeks, if treated properly with non-surgical treatment and resting of the affected area.  RELATED COMPLICATIONS   Longer healing time if not properly treated or if not given enough time to heal.  Chronic inflammation, causing recurring symptoms of tenosynovitis. Permanent pain or restriction of movement.  Risks of surgery: infection, bleeding, injury to nerves (numbness of the thumb), continued pain, incomplete release of the tendon sheath, recurring symptoms, cutting of the tendons, tendons sliding out of position, weakness of the thumb, thumb stiffness. TREATMENT  First, treatment involves the use of medicine and ice, to reduce pain and inflammation. Patients are encouraged to stop or modify activities that aggravate the injury. Stretching and strengthening exercises may be advised. Exercises may be completed at home or with a therapist. You may be fitted with a brace or splint, to limit motion and allow the injury to heal. Your caregiver may also choose to give you a corticosteroid injection, to reduce the pain and inflammation. If non-surgical treatment is not successful, surgery may be needed. Most tenosynovitis surgeries are done as outpatient procedures (you go home the   same day). Surgery may involve local, regional (whole arm), or general anesthesia.  MEDICATION   If pain medicine is needed, nonsteroidal anti-inflammatory medicines (aspirin and ibuprofen), or other minor pain  relievers (acetaminophen), are often advised.  Do not take pain medicine for 7 days before surgery.  Prescription pain relievers are often prescribed only after surgery. Use only as directed and only as much as you need.  Corticosteroid injections may be given if your caregiver thinks they are needed. There is a limited number of times these injections may be given. COLD THERAPY   Cold treatment (icing) should be applied for 10 to 15 minutes every 2 to 3 hours for inflammation and pain, and immediately after activity that aggravates your symptoms. Use ice packs or an ice massage. SEEK MEDICAL CARE IF:   Symptoms get worse or do not improve in 2 to 4 weeks, despite treatment.  You experience pain, numbness, or coldness in the hand.  Blue, gray, or dark color appears in the fingernails.  Any of the following occur after surgery: increased pain, swelling, redness, drainage of fluids, bleeding in the affected area, or signs of infection.  New, unexplained symptoms develop. (Drugs used in treatment may produce side effects.) Document Released: 01/14/2005 Document Revised: 04/08/2011 Document Reviewed: 04/28/2008 Exit[employer] Patient Information 2015 [employer]. This information is not intended to replace advice given to you by your health care provider. Make sure you discuss any questions you have with your health care provider.   

## 2013-08-24 NOTE — ED Provider Notes (Signed)
Medical screening examination/treatment/procedure(s) were performed by resident physician or non-physician practitioner and as supervising physician I was immediately available for consultation/collaboration.   Marvena Tally DOUGLAS MD.   Anwita Mencer D Heru Montz, MD 08/24/13 1656 

## 2013-08-24 NOTE — ED Notes (Signed)
C/o left wrist pain States wrist hurts, swollen, hot , not able to lift items Denies any injury Hx of gout but never took meds for it

## 2013-08-24 NOTE — ED Provider Notes (Signed)
CSN: [id number]     Arrival date & time 08/24/13  1036 History   First MD Initiated Contact with Patient 08/24/13 1113     Chief Complaint  Patient presents with  . Wrist Pain   (Consider location/radiation/quality/duration/timing/severity/associated sxs/prior Treatment) HPI Comments: 73 year old female with a history of gout (she assumes, never diagnosed) presents complaining of left wrist pain. She has had left wrist pain for about 3 weeks. The wrist seems to be swollen and it hurts constantly. It is worse with any movement and is tender to touch. She denies any injury but she states she recently went on vacation and 8 a lot of pork and she thinks that has caused her gout to flare. She denies history of gout pain in her wrist. She has taken ibuprofen at home with minimal relief. She describes her gout history is usually occurring in the first MTP but also in her ankle and knee, she describes it as being red, hot, and swollen. She does not currently have a primary care provider.  Patient is a 73 y.o. female presenting with wrist pain.  Wrist Pain    Past Medical History  Diagnosis Date  . Acute meniscal injury of left knee   . Hypertension   . Constipation   . IC (interstitial cystitis)   . Swelling of left knee joint   . Complication of anesthesia     hypotention  . GERD (gastroesophageal reflux disease)   . Hypoglycemia, unspecified   . Sleep apnea     STOP-Bang =  5   Past Surgical History  Procedure Laterality Date  . Knee arthroscopy Right 2009  . Total abdominal hysterectomy w/ bilateral salpingoophorectomy  AGE 26  . Laparoscopic lysis of adhesions  1980'S  . Tonsillectomy and adenoidectomy  AGE 38  . Knee arthroscopy Left 10/09/2012    Procedure: LEFT ARTHROSCOPY KNEE WITH medial meiscal DEBRIDEMENT;  Surgeon: Loanne DrillingFrank V Aluisio, MD;  [facility];  Service: Orthopedics;  Laterality: Left;   History reviewed. No pertinent family history. History   Substance Use Topics  . Smoking status: Former Smoker -- 0.50 packs/day for 20 years    Types: Cigarettes    Quit date: 10/02/1992  . Smokeless tobacco: Never Used  . Alcohol Use: No   OB History   Grav Para Term Preterm Abortions TAB SAB Ect Mult Living                 Review of Systems  Musculoskeletal: Positive for arthralgias and joint swelling.  All other systems reviewed and are negative.   Allergies  Ketek; Cephalosporins; Codeine; and Flagyl  Home Medications   Prior to Admission medications   Medication Sig Start Date End Date Taking? Authorizing Provider  amitriptyline (ELAVIL) 25 MG tablet Take 50 mg by mouth at bedtime.    Historical Provider, MD  amoxicillin-clavulanate (AUGMENTIN) 875-125 MG per tablet Take 1 tablet by mouth 2 (two) times daily. 07/07/13   Reuben Likesavid C Keller, MD  aspirin 81 MG tablet Take 81 mg by mouth daily.    Historical Provider, MD  atenolol (TENORMIN) 25 MG tablet Take 25 mg by mouth every morning.    Historical Provider, MD  CASCARA SAGRADA PO Take by mouth daily.    Historical Provider, MD  cetirizine (ZYRTEC) 10 MG tablet Take 10 mg by mouth daily.    Historical Provider, MD  Cholecalciferol (VITAMIN D3) 2000 UNITS TABS Take 1 capsule by mouth daily.    Historical Provider, MD  colchicine 0.6 MG tablet Take 1 tablet (0.6 mg total) by mouth 2 (two) times daily as needed (for wrist pain). 08/24/13   Graylon Good, PA-C  dicyclomine (BENTYL) 10 MG capsule Take 10 mg by mouth 3 (three) times daily as needed.    Historical Provider, MD  fluticasone (FLONASE) 50 MCG/ACT nasal spray Place 2 sprays into both nostrils daily. 07/07/13   Reuben Likes, MD  furosemide (LASIX) 40 MG tablet Take 20-40 mg by mouth daily. Takes 20mg  daily if increased fluid retention takes 40mg     Historical Provider, MD  ibuprofen (ADVIL,MOTRIN) 200 MG tablet Take 600 mg by mouth 2 (two) times daily. And prn    Historical Provider, MD  indomethacin (INDOCIN) 50 MG capsule  Take 1 capsule (50 mg total) by mouth 3 (three) times daily as needed for mild pain. 08/24/13   Graylon Good, PA-C  methocarbamol (ROBAXIN) 500 MG tablet Take 1 tablet (500 mg total) by mouth 4 (four) times daily. As needed for muscle spasm 10/09/12   Loanne Drilling, MD  omeprazole (PRILOSEC) 40 MG capsule Take 40 mg by mouth every morning.    Historical Provider, MD  oxyCODONE (ROXICODONE) 5 MG immediate release tablet Take 1-2 tablets (5-10 mg total) by mouth every 4 (four) hours as needed for pain. 10/09/12   Loanne Drilling, MD  potassium chloride SA (K-DUR,KLOR-CON) 20 MEQ tablet Take 10-20 mEq by mouth daily. Takes daily and if taking 40mg  lasix will take    Historical Provider, MD  traMADol (ULTRAM) 50 MG tablet Take 1 tablet (50 mg total) by mouth every 6 (six) hours as needed for moderate pain. 08/24/13   Adrian Blackwater Hodari Chuba, PA-C   BP 145/84  Pulse 82  Temp(Src) 99.3 F (37.4 C) (Oral)  Resp 18  SpO2 98% Physical Exam  Nursing note and vitals reviewed. Constitutional: She is oriented to person, place, and time. Vital signs are normal. She appears well-developed and well-nourished. No distress.  HENT:  Head: Normocephalic and atraumatic.  Cardiovascular:  Pulses:      Radial pulses are 2+ on the left side.  Pulmonary/Chest: Effort normal. No respiratory distress.  Musculoskeletal:       Left wrist: She exhibits decreased range of motion (decreased flexion). She exhibits no bony tenderness, no swelling, no effusion and no crepitus. Tenderness: dorsal wrist and base of first metacarpal   Positive Finkelstein's test left wrist  Neurological: She is alert and oriented to person, place, and time. She has normal strength. Coordination normal.  Skin: Skin is warm and dry. No rash noted. She is not diaphoretic.  Psychiatric: She has a normal mood and affect. Judgment normal.    ED Course  Procedures (including critical care time) Labs Review Labs Reviewed - No data to  display  Imaging Review No results found.   MDM   1. De Quervain's tenosynovitis   2. Left wrist pain   3. History of gout    Unclear whether she actually has history of gout but her history would suggest that she does. Her wrist today is not particularly red and swollen but that may be because she is taking ibuprofen. We'll give her 60 mg of Toradol and 80 mg of Depo-Medrol IM today and discharge with indomethacin, colchicine, or tram, and a thumb spica splint, with instructions to wear the thumb spica for a minimal of 2 weeks, both at night and during the day. Followup with primary care provider for evaluation and  management of possible gout, she has an orthopedist so she will followup there if her wrist is not improved in 2 weeks.   Meds ordered this encounter  Medications  . ketorolac (TORADOL) injection 60 mg    Sig:   . methylPREDNISolone acetate (DEPO-MEDROL) injection 80 mg    Sig:   . indomethacin (INDOCIN) 50 MG capsule    Sig: Take 1 capsule (50 mg total) by mouth 3 (three) times daily as needed for mild pain.    Dispense:  30 capsule    Refill:  0    Order Specific Question:  Supervising Provider    Answer:  Linna Hoff [phone]  . colchicine 0.6 MG tablet    Sig: Take 1 tablet (0.6 mg total) by mouth 2 (two) times daily as needed (for wrist pain).    Dispense:  15 tablet    Refill:  0    Order Specific Question:  Supervising Provider    Answer:  Linna Hoff [phone]  . traMADol (ULTRAM) 50 MG tablet    Sig: Take 1 tablet (50 mg total) by mouth every 6 (six) hours as needed for moderate pain.    Dispense:  20 tablet    Refill:  0    Order Specific Question:  Supervising Provider    Answer:  Bradd Canary D [[id number]]       Graylon Good, PA-C 08/24/13 1131

## 2014-02-04 ENCOUNTER — Other Ambulatory Visit: Payer: Self-pay | Admitting: *Deleted

## 2014-02-04 ENCOUNTER — Encounter: Payer: Self-pay | Admitting: Vascular Surgery

## 2014-02-04 DIAGNOSIS — R0989 Other specified symptoms and signs involving the circulatory and respiratory systems: Secondary | ICD-10-CM

## 2014-02-17 ENCOUNTER — Encounter: Payer: Self-pay | Admitting: Vascular Surgery

## 2014-02-18 ENCOUNTER — Encounter: Payer: Medicare Other | Admitting: Vascular Surgery

## 2014-02-18 ENCOUNTER — Other Ambulatory Visit (HOSPITAL_COMMUNITY): Payer: Medicare Other

## 2014-02-22 ENCOUNTER — Ambulatory Visit: Payer: Medicare Other | Admitting: Diagnostic Neuroimaging

## 2014-03-02 DIAGNOSIS — Z23 Encounter for immunization: Secondary | ICD-10-CM | POA: Diagnosis not present

## 2014-03-07 ENCOUNTER — Encounter: Payer: Self-pay | Admitting: Vascular Surgery

## 2014-03-09 ENCOUNTER — Other Ambulatory Visit (HOSPITAL_COMMUNITY): Payer: Medicare Other

## 2014-03-09 ENCOUNTER — Encounter: Payer: Medicare Other | Admitting: Vascular Surgery

## 2014-04-11 DIAGNOSIS — I10 Essential (primary) hypertension: Secondary | ICD-10-CM | POA: Diagnosis not present

## 2014-04-11 DIAGNOSIS — J019 Acute sinusitis, unspecified: Secondary | ICD-10-CM | POA: Diagnosis not present

## 2014-04-11 DIAGNOSIS — B029 Zoster without complications: Secondary | ICD-10-CM | POA: Diagnosis not present

## 2014-04-11 DIAGNOSIS — F339 Major depressive disorder, recurrent, unspecified: Secondary | ICD-10-CM | POA: Diagnosis not present

## 2014-10-26 DIAGNOSIS — R1032 Left lower quadrant pain: Secondary | ICD-10-CM | POA: Diagnosis not present

## 2014-10-26 DIAGNOSIS — I10 Essential (primary) hypertension: Secondary | ICD-10-CM | POA: Diagnosis not present

## 2014-10-26 DIAGNOSIS — R109 Unspecified abdominal pain: Secondary | ICD-10-CM | POA: Diagnosis not present

## 2014-10-26 DIAGNOSIS — Z23 Encounter for immunization: Secondary | ICD-10-CM | POA: Diagnosis not present

## 2014-10-26 DIAGNOSIS — R7309 Other abnormal glucose: Secondary | ICD-10-CM | POA: Diagnosis not present

## 2014-12-27 DIAGNOSIS — J019 Acute sinusitis, unspecified: Secondary | ICD-10-CM | POA: Diagnosis not present

## 2014-12-27 DIAGNOSIS — K5900 Constipation, unspecified: Secondary | ICD-10-CM | POA: Diagnosis not present

## 2015-03-17 DIAGNOSIS — Z1231 Encounter for screening mammogram for malignant neoplasm of breast: Secondary | ICD-10-CM | POA: Diagnosis not present

## 2015-04-28 DIAGNOSIS — R21 Rash and other nonspecific skin eruption: Secondary | ICD-10-CM | POA: Diagnosis not present

## 2015-04-28 DIAGNOSIS — M25562 Pain in left knee: Secondary | ICD-10-CM | POA: Diagnosis not present

## 2015-05-12 ENCOUNTER — Emergency Department (HOSPITAL_BASED_OUTPATIENT_CLINIC_OR_DEPARTMENT_OTHER): Payer: Commercial Managed Care - HMO

## 2015-05-12 ENCOUNTER — Emergency Department (HOSPITAL_BASED_OUTPATIENT_CLINIC_OR_DEPARTMENT_OTHER)
Admission: EM | Admit: 2015-05-12 | Discharge: 2015-05-12 | Disposition: A | Discharge status: Home / Self Care | Payer: Commercial Managed Care - HMO | Attending: Emergency Medicine | Admitting: Emergency Medicine

## 2015-05-12 DIAGNOSIS — Z87448 Personal history of other diseases of urinary system: Secondary | ICD-10-CM | POA: Insufficient documentation

## 2015-05-12 DIAGNOSIS — Z7951 Long term (current) use of inhaled steroids: Secondary | ICD-10-CM | POA: Diagnosis not present

## 2015-05-12 DIAGNOSIS — Z7982 Long term (current) use of aspirin: Secondary | ICD-10-CM | POA: Diagnosis not present

## 2015-05-12 DIAGNOSIS — K219 Gastro-esophageal reflux disease without esophagitis: Secondary | ICD-10-CM | POA: Diagnosis not present

## 2015-05-12 DIAGNOSIS — G4730 Sleep apnea, unspecified: Secondary | ICD-10-CM | POA: Insufficient documentation

## 2015-05-12 DIAGNOSIS — Z79899 Other long term (current) drug therapy: Secondary | ICD-10-CM | POA: Insufficient documentation

## 2015-05-12 DIAGNOSIS — Z87891 Personal history of nicotine dependence: Secondary | ICD-10-CM | POA: Insufficient documentation

## 2015-05-12 DIAGNOSIS — Z8639 Personal history of other endocrine, nutritional and metabolic disease: Secondary | ICD-10-CM | POA: Diagnosis not present

## 2015-05-12 DIAGNOSIS — Z87828 Personal history of other (healed) physical injury and trauma: Secondary | ICD-10-CM | POA: Diagnosis not present

## 2015-05-12 DIAGNOSIS — Z9981 Dependence on supplemental oxygen: Secondary | ICD-10-CM | POA: Insufficient documentation

## 2015-05-12 DIAGNOSIS — Z792 Long term (current) use of antibiotics: Secondary | ICD-10-CM | POA: Diagnosis not present

## 2015-05-12 DIAGNOSIS — M25512 Pain in left shoulder: Secondary | ICD-10-CM | POA: Insufficient documentation

## 2015-05-12 DIAGNOSIS — Z791 Long term (current) use of non-steroidal anti-inflammatories (NSAID): Secondary | ICD-10-CM | POA: Insufficient documentation

## 2015-05-12 DIAGNOSIS — R0602 Shortness of breath: Secondary | ICD-10-CM | POA: Diagnosis not present

## 2015-05-12 DIAGNOSIS — I10 Essential (primary) hypertension: Secondary | ICD-10-CM | POA: Diagnosis not present

## 2015-05-12 LAB — BASIC METABOLIC PANEL
ANION GAP: 9 (ref 5–15)
BUN: 17 mg/dL (ref 6–20)
CALCIUM: 9.8 mg/dL (ref 8.9–10.3)
CHLORIDE: 101 mmol/L (ref 101–111)
CO2: 29 mmol/L (ref 22–32)
CREATININE: 1.07 mg/dL — AB (ref 0.44–1.00)
GFR calc non Af Amer: 50 mL/min — ABNORMAL LOW (ref >60)
GFR, EST AFRICAN AMERICAN: 58 mL/min — AB (ref >60)
Glucose, Bld: 104 mg/dL — ABNORMAL HIGH (ref 65–99)
Potassium: 3.7 mmol/L (ref 3.5–5.1)
SODIUM: 139 mmol/L (ref 135–145)

## 2015-05-12 LAB — TROPONIN I

## 2015-05-12 LAB — CBC
HEMATOCRIT: 44.4 % (ref 36.0–46.0)
HEMOGLOBIN: 15 g/dL (ref 12.0–15.0)
MCH: 29.9 pg (ref 26.0–34.0)
MCHC: 33.8 g/dL (ref 30.0–36.0)
MCV: 88.6 fL (ref 78.0–100.0)
Platelets: 321 10*3/uL (ref 150–400)
RBC: 5.01 MIL/uL (ref 3.87–5.11)
RDW: 13.1 % (ref 11.5–15.5)
WBC: 9.5 10*3/uL (ref 4.0–10.5)

## 2015-05-12 LAB — D-DIMER, QUANTITATIVE (NOT AT ARMC): D DIMER QUANT: 0.29 ug{FEU}/mL (ref 0.00–0.50)

## 2015-05-12 MED ORDER — NAPROXEN 250 MG PO TABS
500.0000 mg | ORAL_TABLET | Freq: Two times a day (BID) | ORAL | Status: DC
  Administered 2015-05-12: 500 mg via ORAL
  Filled 2015-05-12: qty 2

## 2015-05-12 MED ORDER — CYCLOBENZAPRINE HCL 10 MG PO TABS
5.0000 mg | ORAL_TABLET | Freq: Once | ORAL | Status: AC
  Administered 2015-05-12: 5 mg via ORAL
  Filled 2015-05-12: qty 1

## 2015-05-12 MED ORDER — CYCLOBENZAPRINE HCL 10 MG PO TABS: 10.0000 mg | ORAL_TABLET | Freq: Two times a day (BID) | ORAL | Status: DC | PRN

## 2015-05-12 MED FILL — CYCLOBENZAPRINE 10 MG TAB: 10 | 10 days supply | Qty: 20 | Fill #0

## 2015-05-12 NOTE — ED Provider Notes (Signed)
CSN: [id number]     Arrival date & time 05/12/15  1018 History   First MD Initiated Contact with Patient 05/12/15 1039     Chief Complaint  Patient presents with  . Shoulder Pain   (Consider location/radiation/quality/duration/timing/severity/associated sxs/prior Treatment) HPI 75 y.o. female with a hx of HTN, presents to the Emergency Department today complaining of left shoulder pain that started around 11am yesterday. Pt states that she was sitting on the couch when she initially felt the pain. No other inciting factors. No trauma to the area. Upon questioning, the pain is located on the left scapula, but patient states "it's deep on the inside." Notes sharp pain initially, but gradually getting better and turned into a dull ache today. Has been constat. Tried OTC Tylenol with minimal relief. States pain is 7/10. When nurse put tourniquet on arm for blood draw, the pain felt worse. Noted SOB this morning and decided to come to the ED. Last stress test 2008. No CP/ABD pain. No headaches. No diaphoresis. No N/V/D. No other symptoms noted.   Past Medical History  Diagnosis Date  . Acute meniscal injury of left knee   . Hypertension   . Constipation   . IC (interstitial cystitis)   . Swelling of left knee joint   . Complication of anesthesia     hypotention  . GERD (gastroesophageal reflux disease)   . Hypoglycemia, unspecified   . Sleep apnea     STOP-Bang =  5   Past Surgical History  Procedure Laterality Date  . Knee arthroscopy Right 2009  . Total abdominal hysterectomy w/ bilateral salpingoophorectomy  AGE 66  . Laparoscopic lysis of adhesions  1980'S  . Tonsillectomy and adenoidectomy  AGE 39  . Knee arthroscopy Left 10/09/2012    Procedure: LEFT ARTHROSCOPY KNEE WITH medial meiscal DEBRIDEMENT;  Surgeon: Loanne Drilling, MD;  Location: [facility];  Service: Orthopedics;  Laterality: Left;   No family history on file. Social History  Substance Use Topics  .  Smoking status: Former Smoker -- 0.50 packs/day for 20 years    Types: Cigarettes    Quit date: 10/02/1992  . Smokeless tobacco: Never Used  . Alcohol Use: No   OB History    No data available     Review of Systems ROS reviewed and all are negative for acute change except as noted in the HPI.  Allergies  Ketek; Cephalosporins; Codeine; and Flagyl  Home Medications   Prior to Admission medications   Medication Sig Start Date End Date Taking? Authorizing Provider  amitriptyline (ELAVIL) 25 MG tablet Take 50 mg by mouth at bedtime.    Historical Provider, MD  amoxicillin-clavulanate (AUGMENTIN) 875-125 MG per tablet Take 1 tablet by mouth 2 (two) times daily. 07/07/13   Reuben Likes, MD  aspirin 81 MG tablet Take 81 mg by mouth daily.    Historical Provider, MD  atenolol (TENORMIN) 25 MG tablet Take 25 mg by mouth every morning.    Historical Provider, MD  CASCARA SAGRADA PO Take by mouth daily.    Historical Provider, MD  cetirizine (ZYRTEC) 10 MG tablet Take 10 mg by mouth daily.    Historical Provider, MD  Cholecalciferol (VITAMIN D3) 2000 UNITS TABS Take 1 capsule by mouth daily.    Historical Provider, MD  colchicine 0.6 MG tablet Take 1 tablet (0.6 mg total) by mouth 2 (two) times daily as needed (for wrist pain). 08/24/13   Graylon Good, PA-C  dicyclomine (BENTYL) 10  MG capsule Take 10 mg by mouth 3 (three) times daily as needed.    Historical Provider, MD  fluticasone (FLONASE) 50 MCG/ACT nasal spray Place 2 sprays into both nostrils daily. 07/07/13   Reuben Likesavid C Keller, MD  furosemide (LASIX) 40 MG tablet Take 20-40 mg by mouth daily. Takes 20mg  daily if increased fluid retention takes 40mg     Historical Provider, MD  ibuprofen (ADVIL,MOTRIN) 200 MG tablet Take 600 mg by mouth 2 (two) times daily. And prn    Historical Provider, MD  indomethacin (INDOCIN) 50 MG capsule Take 1 capsule (50 mg total) by mouth 3 (three) times daily as needed for mild pain. 08/24/13   Graylon GoodZachary H Baker,  PA-C  methocarbamol (ROBAXIN) 500 MG tablet Take 1 tablet (500 mg total) by mouth 4 (four) times daily. As needed for muscle spasm 10/09/12   Ollen GrossFrank Aluisio, MD  omeprazole (PRILOSEC) 40 MG capsule Take 40 mg by mouth every morning.    Historical Provider, MD  oxyCODONE (ROXICODONE) 5 MG immediate release tablet Take 1-2 tablets (5-10 mg total) by mouth every 4 (four) hours as needed for pain. 10/09/12   Ollen GrossFrank Aluisio, MD  potassium chloride SA (K-DUR,KLOR-CON) 20 MEQ tablet Take 10-20 mEq by mouth daily. Takes 10meq daily and if taking 40mg  lasix will take 20meq    Historical Provider, MD  traMADol (ULTRAM) 50 MG tablet Take 1 tablet (50 mg total) by mouth every 6 (six) hours as needed for moderate pain. 08/24/13   Adrian BlackwaterZachary H Baker, PA-C   BP 152/81 mmHg  Pulse 88  Temp(Src) 98.1 F (36.7 C) (Oral)  Resp 22  Ht 5\' 5"  (1.651 m)  Wt 90.719 kg  BMI 33.28 kg/m2  SpO2 98%   Physical Exam  Constitutional: She is oriented to person, place, and time. She appears well-developed and well-nourished.  HENT:  Head: Normocephalic and atraumatic.  Eyes: EOM are normal. Pupils are equal, round, and reactive to light.  Neck: Normal range of motion. Neck supple. No tracheal deviation present.  Cardiovascular: Normal rate, regular rhythm, normal heart sounds and intact distal pulses.   No murmur heard. Pulmonary/Chest: Effort normal and breath sounds normal. No respiratory distress. She has no wheezes. She has no rales. She exhibits no tenderness.  Abdominal: Soft. There is no tenderness.  Musculoskeletal:       Left shoulder: She exhibits decreased range of motion. She exhibits no tenderness, no swelling, no effusion, no crepitus, no deformity, no pain, normal pulse and normal strength.  Neurological: She is alert and oriented to person, place, and time.  Skin: Skin is warm and dry.  Psychiatric: She has a normal mood and affect. Her behavior is normal. Thought content normal.  Nursing note and vitals  reviewed.  ED Course  Procedures (including critical care time) Labs Review Labs Reviewed  BASIC METABOLIC PANEL - Abnormal; Notable for the following:    Glucose, Bld 104 (*)    Creatinine, Ser 1.07 (*)    GFR calc non Af Amer 50 (*)    GFR calc Af Amer 58 (*)    All other components within normal limits  CBC  TROPONIN I  D-DIMER, QUANTITATIVE (NOT AT [facility])  TROPONIN I   Imaging Review Dg Chest 2 View  05/12/2015  CLINICAL DATA:  LEFT shoulder pain since yesterday, shortness of breath, hypertension, former smoker EXAM: CHEST  2 VIEW COMPARISON:  None FINDINGS: Upper normal heart size. Tortuous aorta. Mediastinal contours and pulmonary vascularity otherwise normal. Minimal linear subsegmental atelectasis  versus scarring at lingula. Lungs otherwise clear. No pleural effusion or pneumothorax. LEFT glenohumeral degenerative changes noted. IMPRESSION: Minimal linear subsegmental atelectasis versus scarring at lingula. LEFT glenohumeral degenerative changes. Electronically Signed   By: Ulyses Southward M.D.   On: 05/12/2015 11:19   I have personally reviewed and evaluated these images and lab results as part of my medical decision-making.   EKG Interpretation   Date/Time:  Friday May 12 2015 10:43:49 EDT Ventricular Rate:  70 PR Interval:  180 QRS Duration: 106 QT Interval:  397 QTC Calculation: 428 R Axis:   -3 Text Interpretation:  Sinus rhythm Low voltage, precordial leads Confirmed  by Lincoln Brigham [phone]) on 05/12/2015 11:11:49 AM      MDM  I have reviewed and evaluated the relevant laboratory values.I have reviewed and evaluated the relevant imaging studies.I personally evaluated and interpreted the relevant EKG.I have reviewed the relevant previous healthcare records.I obtained HPI from historian. Patient discussed with supervising physician  ED Course:  Assessment: Pt is a 74yF presents with left scapular pain since last night. Given Aleve and Flexeril in ED. Patient is to be  discharged with recommendation to follow up with PCP in regards to today's hospital visit. Chest pain is not likely of cardiac or pulmonary etiology d/t presentation, perc negative, VSS, no tracheal deviation, no JVD or new murmur, RRR, breath sounds equal bilaterally, EKG without acute abnormalities, negative troponin x2, and negative CXR. Most likely musculoskeletal as pain improved with muscle relaxants. Pt appears reliable for follow up and is agreeable to discharge. Patient is in no acute distress. Vital Signs are stable. Patient is able to ambulate. Patient able to tolerate PO.   Disposition/Plan:  DC Home Additional Verbal discharge instructions given and discussed with patient.  Pt Instructed to f/u with PCP in the next week for evaluation and treatment of symptoms. Return precautions given Pt acknowledges and agrees with plan  Supervising Physician Tilden Fossa, MD   Final diagnoses:  Left shoulder pain       Audry Pili, PA-C 05/12/15 1532  Tilden Fossa, MD 05/13/15 [phone]

## 2015-05-12 NOTE — ED Notes (Signed)
Pt reports acute onset of left side shoulder pain with no associated symptoms, today nauseated , sob at times,

## 2015-05-12 NOTE — ED Notes (Signed)
Patient assisted in walking back from restroom, set back up on cardiac monitor. Patient in bed comfortable and resting.

## 2015-05-12 NOTE — ED Notes (Signed)
Pt on automatic VS.  

## 2015-05-12 NOTE — ED Notes (Signed)
pa at bedside. 

## 2015-05-12 NOTE — Discharge Instructions (Signed)
Please read and follow all provided instructions.  Your diagnoses today include:  1. Left shoulder pain    Tests performed today include:  An EKG of your heart  A chest x-ray  Cardiac enzymes - a blood test for heart muscle damage  Blood counts and electrolytes  Vital signs. See below for your results today.   Medications prescribed:   Take any prescribed medications only as directed.  Follow-up instructions: Please follow-up with your primary care provider as soon as you can for further evaluation of your symptoms.   Return instructions:  SEEK IMMEDIATE MEDICAL ATTENTION IF:  You have severe chest pain, especially if the pain is crushing or pressure-like and spreads to the arms, back, neck, or jaw, or if you have sweating, nausea (feeling sick to your stomach), or shortness of breath. THIS IS AN EMERGENCY. Don't wait to see if the pain will go away. Get medical help at once. Call 911 or 0 (operator). DO NOT drive yourself to the hospital.   Your chest pain gets worse and does not go away with rest.   You have an attack of chest pain lasting longer than usual, despite rest and treatment with the medications your caregiver has prescribed.   You wake from sleep with chest pain or shortness of breath.  You feel dizzy or faint.  You have chest pain not typical of your usual pain for which you originally saw your caregiver.   You have any other emergent concerns regarding your health.  Additional Information: Chest pain comes from many different causes. Your caregiver has diagnosed you as having chest pain that is not specific for one problem, but does not require admission.  You are at low risk for an acute heart condition or other serious illness.   Your vital signs today were: BP 120/79 mmHg   Pulse 67   Temp(Src) 98.1 F (36.7 C) (Oral)   Resp 17   Ht 5\' 5"  (1.651 m)   Wt 90.719 kg   BMI 33.28 kg/m2   SpO2 98% If your blood pressure (BP) was elevated above 135/85 this  visit, please have this repeated by your doctor within one month. --------------

## 2015-05-26 ENCOUNTER — Emergency Department (HOSPITAL_BASED_OUTPATIENT_CLINIC_OR_DEPARTMENT_OTHER): Payer: Commercial Managed Care - HMO

## 2015-05-26 ENCOUNTER — Emergency Department (HOSPITAL_BASED_OUTPATIENT_CLINIC_OR_DEPARTMENT_OTHER)
Admission: EM | Admit: 2015-05-26 | Discharge: 2015-05-26 | Disposition: A | Discharge status: Home / Self Care | Payer: Commercial Managed Care - HMO | Attending: Emergency Medicine | Admitting: Emergency Medicine

## 2015-05-26 ENCOUNTER — Encounter (HOSPITAL_BASED_OUTPATIENT_CLINIC_OR_DEPARTMENT_OTHER): Payer: Self-pay | Admitting: *Deleted

## 2015-05-26 DIAGNOSIS — Z87891 Personal history of nicotine dependence: Secondary | ICD-10-CM | POA: Insufficient documentation

## 2015-05-26 DIAGNOSIS — E162 Hypoglycemia, unspecified: Secondary | ICD-10-CM | POA: Insufficient documentation

## 2015-05-26 DIAGNOSIS — R61 Generalized hyperhidrosis: Secondary | ICD-10-CM | POA: Insufficient documentation

## 2015-05-26 DIAGNOSIS — I10 Essential (primary) hypertension: Secondary | ICD-10-CM | POA: Insufficient documentation

## 2015-05-26 DIAGNOSIS — R0789 Other chest pain: Secondary | ICD-10-CM | POA: Insufficient documentation

## 2015-05-26 DIAGNOSIS — R079 Chest pain, unspecified: Secondary | ICD-10-CM | POA: Diagnosis not present

## 2015-05-26 LAB — CBC
HCT: 44.3 % (ref 36.0–46.0)
Hemoglobin: 14.8 g/dL (ref 12.0–15.0)
MCH: 29.7 pg (ref 26.0–34.0)
MCHC: 33.4 g/dL (ref 30.0–36.0)
MCV: 88.8 fL (ref 78.0–100.0)
PLATELETS: 275 10*3/uL (ref 150–400)
RBC: 4.99 MIL/uL (ref 3.87–5.11)
RDW: 12.9 % (ref 11.5–15.5)
WBC: 12.4 10*3/uL — ABNORMAL HIGH (ref 4.0–10.5)

## 2015-05-26 LAB — BASIC METABOLIC PANEL
Anion gap: 5 (ref 5–15)
BUN: 15 mg/dL (ref 6–20)
CO2: 28 mmol/L (ref 22–32)
CREATININE: 0.93 mg/dL (ref 0.44–1.00)
Calcium: 9.2 mg/dL (ref 8.9–10.3)
Chloride: 103 mmol/L (ref 101–111)
GFR, EST NON AFRICAN AMERICAN: 60 mL/min — AB (ref >60)
Glucose, Bld: 93 mg/dL (ref 65–99)
POTASSIUM: 3.9 mmol/L (ref 3.5–5.1)
SODIUM: 136 mmol/L (ref 135–145)

## 2015-05-26 LAB — D-DIMER, QUANTITATIVE: D-Dimer, Quant: <0.27 ug/mL-FEU (ref 0.00–0.50)

## 2015-05-26 LAB — TROPONIN I

## 2015-05-26 NOTE — Discharge Instructions (Signed)
Please read and follow all provided instructions.  Your diagnoses today include:  1. Chest wall pain     Tests performed today include:  An EKG of your heart  A chest x-ray  Cardiac enzymes - a blood test for heart muscle damage  Blood counts and electrolytes  D-dimer - test for blood clot was negative  Vital signs. See below for your results today.   Medications prescribed:   None  Take any prescribed medications only as directed.  Follow-up instructions: Please follow-up with your primary care provider as soon as you can for further evaluation of your symptoms.   Return instructions:  SEEK IMMEDIATE MEDICAL ATTENTION IF:  You have severe chest pain, especially if the pain is crushing or pressure-like and spreads to the arms, back, neck, or jaw, or if you have sweating, nausea (feeling sick to your stomach), or shortness of breath. THIS IS AN EMERGENCY. Don't wait to see if the pain will go away. Get medical help at once. Call 911 or 0 (operator). DO NOT drive yourself to the hospital.   Your chest pain gets worse and does not go away with rest.   You have an attack of chest pain lasting longer than usual, despite rest and treatment with the medications your caregiver has prescribed.   You wake from sleep with chest pain or shortness of breath.  You feel dizzy or faint.  You have chest pain not typical of your usual pain for which you originally saw your caregiver.   You have any other emergent concerns regarding your health.  Additional Information: Chest pain comes from many different causes. Your caregiver has diagnosed you as having chest pain that is not specific for one problem, but does not require admission.  You are at low risk for an acute heart condition or other serious illness.   Your vital signs today were: BP 135/65 mmHg   Pulse 64   Temp(Src) 97.6 F (36.4 C) (Oral)   Resp 17   Ht 5\' 4"  (1.626 m)   Wt 90.719 kg   BMI 34.31 kg/m2   SpO2 94% If your  blood pressure (BP) was elevated above 135/85 this visit, please have this repeated by your doctor within one month. --------------

## 2015-05-26 NOTE — ED Notes (Signed)
Pt reports left flank pain, tender and reproducible on palpation x 1 day.  Denies N/V/D.

## 2015-05-26 NOTE — ED Notes (Addendum)
Pt states she is having pain under her left breast since last night.  Some diaphoresis, some nausea.

## 2015-05-26 NOTE — ED Provider Notes (Signed)
CSN: [id number]     Arrival date & time 05/26/15  1451 History   First MD Initiated Contact with Patient 05/26/15 1604     Chief Complaint  Patient presents with  . Chest Pain     (Consider location/radiation/quality/duration/timing/severity/associated sxs/prior Treatment) HPI Comments: Patient with recent emergency department visit on 05/12/15 -- presents with complaint of pain under her left breast starting at 4 AM this morning. Pain was persistent until about noon when it gradually eased off. Currently, pain is only brought on with palpation of the area. Patient denies injury to the area. She did not have any shortness of breath associated with the pain but did states that she was sweating at the onset until she got to the shower to cool off. She has had no vomiting. Pain started at rest and patient does not have any history of any exertional pain. No fevers. She has had occasional nonproductive cough for the past several days. Patient has a history of hypertension. No cholesterol, smoking in the past 20 years. Patient states he had a stress test in 2009 which was negative. No other cardiac history.  Patient was seen in emergency department 2 weeks ago for left shoulder pain. At that time she had a negative troponin and d-dimer. This was felt to be musculoskeletal pain. Patient states that she has had relief at home with Flexeril, however the pain is not completely gone. No shoulder pain at the current time.  Patient is a 75 y.o. female presenting with chest pain. The history is provided by the patient and medical records.  Chest Pain Associated symptoms: diaphoresis (temporary at onset)   Associated symptoms: no abdominal pain, no back pain, no cough, no fever, no nausea, no palpitations, no shortness of breath and not vomiting     Past Medical History  Diagnosis Date  . Acute meniscal injury of left knee   . Hypertension   . Constipation   . IC (interstitial cystitis)   . Swelling of  left knee joint   . Complication of anesthesia     hypotention  . GERD (gastroesophageal reflux disease)   . Hypoglycemia, unspecified   . Sleep apnea     STOP-Bang =  5   Past Surgical History  Procedure Laterality Date  . Knee arthroscopy Right 2009  . Total abdominal hysterectomy w/ bilateral salpingoophorectomy  AGE 34  . Laparoscopic lysis of adhesions  1980'S  . Tonsillectomy and adenoidectomy  AGE 3  . Knee arthroscopy Left 10/09/2012    Procedure: LEFT ARTHROSCOPY KNEE WITH medial meiscal DEBRIDEMENT;  Surgeon: Loanne DrillingFrank V Aluisio, MD;  [facility];  Service: Orthopedics;  Laterality: Left;   History reviewed. No pertinent family history. Social History  Substance Use Topics  . Smoking status: Former Smoker -- 0.50 packs/day for 20 years    Types: Cigarettes    Quit date: 10/02/1992  . Smokeless tobacco: Never Used  . Alcohol Use: No   OB History    No data available     Review of Systems  Constitutional: Positive for diaphoresis (temporary at onset). Negative for fever.  Eyes: Negative for redness.  Respiratory: Negative for cough and shortness of breath.   Cardiovascular: Positive for chest pain. Negative for palpitations and leg swelling.  Gastrointestinal: Negative for nausea, vomiting and abdominal pain.  Genitourinary: Negative for dysuria.  Musculoskeletal: Negative for back pain and neck pain.  Skin: Negative for rash.  Neurological: Negative for syncope and light-headedness.  Psychiatric/Behavioral: The patient  is not nervous/anxious.     Allergies  Ketek; Cephalosporins; Codeine; and Flagyl  Home Medications   Prior to Admission medications   Medication Sig Start Date End Date Taking? Authorizing Provider  amitriptyline (ELAVIL) 25 MG tablet Take 50 mg by mouth at bedtime.    Historical Provider, MD  aspirin 81 MG tablet Take 81 mg by mouth daily.    Historical Provider, MD  atenolol (TENORMIN) 25 MG tablet Take 25 mg by mouth  every morning.    Historical Provider, MD  CASCARA SAGRADA PO Take by mouth daily.    Historical Provider, MD  cetirizine (ZYRTEC) 10 MG tablet Take 10 mg by mouth daily.    Historical Provider, MD  Cholecalciferol (VITAMIN D3) 2000 UNITS TABS Take 1 capsule by mouth daily.    Historical Provider, MD  furosemide (LASIX) 40 MG tablet Take 20-40 mg by mouth daily. Takes  daily if increased fluid retention takes     Historical Provider, MD  ibuprofen (ADVIL,MOTRIN) 200 MG tablet Take 600 mg by mouth 2 (two) times daily. And prn    Historical Provider, MD  omeprazole (PRILOSEC) 40 MG capsule Take 40 mg by mouth every morning.    Historical Provider, MD  potassium chloride SA (K-DUR,KLOR-CON) 20 MEQ tablet Take 10-20 mEq by mouth daily. Takes daily and if taking  lasix will take    Historical Provider, MD   BP 139/75 mmHg  Pulse 99  Temp(Src) 97.6 F (36.4 C) (Oral)  Resp 18  Ht  (1.626 m)  Wt 90.719 kg  BMI 34.31 kg/m2  SpO2 99%   Physical Exam  Constitutional: She appears well-developed and well-nourished.  HENT:  Head: Normocephalic and atraumatic.  Mouth/Throat: Oropharynx is clear and moist and mucous membranes are normal. Mucous membranes are not dry.  Eyes: Conjunctivae are normal.  Neck: Trachea normal and normal range of motion. Neck supple. Normal carotid pulses and no JVD present. No muscular tenderness present. Carotid bruit is not present. No tracheal deviation present.  Cardiovascular: Normal rate, regular rhythm, S1 normal, S2 normal, normal heart sounds and intact distal pulses.  Exam reveals no decreased pulses.   No murmur heard. Pulmonary/Chest: Effort normal. No respiratory distress. She has no wheezes. She exhibits tenderness (Patient with point tenderness to her left anterior ribs under her left breast reproducing her chest pain symptoms.).  Abdominal: Soft. Normal aorta and bowel sounds are normal. There is no tenderness. There is no rebound  and no guarding.  Musculoskeletal: Normal range of motion.  Neurological: She is alert.  Skin: Skin is warm and dry. She is not diaphoretic. No cyanosis. No pallor.  Psychiatric: She has a normal mood and affect.  Nursing note and vitals reviewed.   ED Course  Procedures (including critical care time) Labs Review Labs Reviewed  BASIC METABOLIC PANEL - Abnormal; Notable for the following:    GFR calc non Af Amer 60 (*)    All other components within normal limits  CBC - Abnormal; Notable for the following:    WBC 12.4 (*)    All other components within normal limits  TROPONIN I  D-DIMER, QUANTITATIVE (NOT AT [facility])    Imaging Review Dg Chest 2 View  05/26/2015  CLINICAL DATA:  Left breast pain, left shoulder pain.  Chest pain. EXAM: CHEST  2 VIEW COMPARISON:  05/12/2015 FINDINGS: Lingular density again noted, likely scarring or atelectasis. No confluent opacity on the right. No effusions. Heart is normal size. No acute bony  abnormality. IMPRESSION: Lingular scarring or atelectasis, stable. No active disease. Electronically Signed   By: Charlett Nose M.D.   On: 05/26/2015 16:47   I have personally reviewed and evaluated these images and lab results as part of my medical decision-making.   EKG Interpretation   Date/Time:  Friday May 26 2015 16:20:21 EDT Ventricular Rate:  75 PR Interval:  168 QRS Duration: 94 QT Interval:  392 QTC Calculation: 438 R Axis:   12 Text Interpretation:  Sinus rhythm Low voltage, precordial leads Confirmed  by Ranae Palms  MD, DAVID ([id number]) on 05/26/2015 5:41:02 PM       4:29 PM Patient seen and examined. Work-up initiated. CP is very atypical for ACS. Will check CXR, troponin, EKG given risk factors. Patient is very anxious.   Vital signs reviewed and are as follows: BP 139/75 mmHg  Pulse 99  Temp(Src) 97.6 F (36.4 C) (Oral)  Resp 18  Ht  (1.626 m)  Wt 90.719 kg  BMI 34.31 kg/m2  SpO2 99%  6:58 PM Work-up is unremarkable. Patient  discussed with and seen by Dr. Ranae Palms. Agrees this is likely chest wall pain. Discussed results with patient and daughter now at bedside. Symptoms remained controlled. Encouraged PCP follow-up in the next week.  Patient was counseled to return with severe chest pain, especially if the pain is crushing or pressure-like and spreads to the arms, back, neck, or jaw, or if they have sweating, nausea, or shortness of breath with the pain. They were encouraged to call 911 with these symptoms.   They were also told to return if their chest pain gets worse and does not go away with rest, they have an attack of chest pain lasting longer than usual despite rest and treatment with the medications their caregiver has prescribed, if they wake from sleep with chest pain or shortness of breath, if they feel dizzy or faint, if they have chest pain not typical of their usual pain, or if they have any other emergent concerns regarding their health.  The patient verbalized understanding and agreed.     MDM   Final diagnoses:  Chest wall pain   Patient with chest pain, reproducible, atypical for ACS. Feel patient is low risk for ACS given history (poor story for ACS/MI), negative troponin(s), normal/unchanged EKG. D-dimer is negative. Chest x-ray is negative. Pain is not exertional. No radiation or vomiting. Diaphoresis at onset only. Feel patient is safe for d/c to home with PCP follow-up and does not warrant further evaluation inpatient at this time. Return instructions discussed extensively as above.    Renne Crigler, PA-C 05/26/15 1900  Loren Racer, MD 05/29/15 [phone]

## 2015-08-11 DIAGNOSIS — Z13820 Encounter for screening for osteoporosis: Secondary | ICD-10-CM | POA: Diagnosis not present

## 2015-08-11 DIAGNOSIS — R079 Chest pain, unspecified: Secondary | ICD-10-CM | POA: Diagnosis not present

## 2015-08-11 DIAGNOSIS — Z0000 Encounter for general adult medical examination without abnormal findings: Secondary | ICD-10-CM | POA: Diagnosis not present

## 2015-08-11 DIAGNOSIS — R7309 Other abnormal glucose: Secondary | ICD-10-CM | POA: Diagnosis not present

## 2015-08-11 DIAGNOSIS — I10 Essential (primary) hypertension: Secondary | ICD-10-CM | POA: Diagnosis not present

## 2015-08-25 ENCOUNTER — Other Ambulatory Visit: Payer: Self-pay | Admitting: Internal Medicine

## 2015-08-25 ENCOUNTER — Other Ambulatory Visit: Payer: Self-pay | Admitting: Nurse Practitioner

## 2015-08-25 DIAGNOSIS — R1084 Generalized abdominal pain: Secondary | ICD-10-CM

## 2015-08-28 DIAGNOSIS — R1011 Right upper quadrant pain: Secondary | ICD-10-CM | POA: Diagnosis not present

## 2015-09-01 ENCOUNTER — Other Ambulatory Visit: Payer: Medicare Other

## 2015-09-22 DIAGNOSIS — K219 Gastro-esophageal reflux disease without esophagitis: Secondary | ICD-10-CM | POA: Diagnosis not present

## 2015-09-22 DIAGNOSIS — K760 Fatty (change of) liver, not elsewhere classified: Secondary | ICD-10-CM | POA: Diagnosis not present

## 2015-09-22 DIAGNOSIS — R1011 Right upper quadrant pain: Secondary | ICD-10-CM | POA: Diagnosis not present

## 2015-09-27 DIAGNOSIS — K219 Gastro-esophageal reflux disease without esophagitis: Secondary | ICD-10-CM | POA: Diagnosis not present

## 2015-09-27 DIAGNOSIS — R1011 Right upper quadrant pain: Secondary | ICD-10-CM | POA: Diagnosis not present

## 2015-12-14 DIAGNOSIS — H18413 Arcus senilis, bilateral: Secondary | ICD-10-CM | POA: Diagnosis not present

## 2015-12-14 DIAGNOSIS — H2512 Age-related nuclear cataract, left eye: Secondary | ICD-10-CM | POA: Diagnosis not present

## 2015-12-14 DIAGNOSIS — H2513 Age-related nuclear cataract, bilateral: Secondary | ICD-10-CM | POA: Diagnosis not present

## 2016-02-22 DIAGNOSIS — R7309 Other abnormal glucose: Secondary | ICD-10-CM | POA: Diagnosis not present

## 2016-02-22 DIAGNOSIS — M2550 Pain in unspecified joint: Secondary | ICD-10-CM | POA: Diagnosis not present

## 2016-02-22 DIAGNOSIS — I10 Essential (primary) hypertension: Secondary | ICD-10-CM | POA: Diagnosis not present

## 2016-03-07 DIAGNOSIS — J019 Acute sinusitis, unspecified: Secondary | ICD-10-CM | POA: Diagnosis not present

## 2016-04-04 DIAGNOSIS — J309 Allergic rhinitis, unspecified: Secondary | ICD-10-CM | POA: Diagnosis not present

## 2016-04-04 DIAGNOSIS — J019 Acute sinusitis, unspecified: Secondary | ICD-10-CM | POA: Diagnosis not present

## 2016-05-23 DIAGNOSIS — N329 Bladder disorder, unspecified: Secondary | ICD-10-CM | POA: Diagnosis not present

## 2016-05-23 DIAGNOSIS — I10 Essential (primary) hypertension: Secondary | ICD-10-CM | POA: Diagnosis not present

## 2016-05-23 DIAGNOSIS — R413 Other amnesia: Secondary | ICD-10-CM | POA: Diagnosis not present

## 2016-05-23 DIAGNOSIS — F339 Major depressive disorder, recurrent, unspecified: Secondary | ICD-10-CM | POA: Diagnosis not present

## 2016-06-12 DIAGNOSIS — M8589 Other specified disorders of bone density and structure, multiple sites: Secondary | ICD-10-CM | POA: Diagnosis not present

## 2016-06-12 DIAGNOSIS — Z1231 Encounter for screening mammogram for malignant neoplasm of breast: Secondary | ICD-10-CM | POA: Diagnosis not present

## 2016-06-19 ENCOUNTER — Ambulatory Visit
Admission: RE | Admit: 2016-06-19 | Discharge: 2016-06-19 | Disposition: A | Discharge status: Home / Self Care | Payer: Medicare HMO | Source: Ambulatory Visit | Attending: Nurse Practitioner | Admitting: Nurse Practitioner

## 2016-06-19 ENCOUNTER — Other Ambulatory Visit: Payer: Self-pay | Admitting: Nurse Practitioner

## 2016-06-19 DIAGNOSIS — J329 Chronic sinusitis, unspecified: Secondary | ICD-10-CM | POA: Diagnosis not present

## 2016-06-19 DIAGNOSIS — J3489 Other specified disorders of nose and nasal sinuses: Secondary | ICD-10-CM

## 2016-06-20 DIAGNOSIS — R51 Headache: Secondary | ICD-10-CM | POA: Diagnosis not present

## 2016-06-20 DIAGNOSIS — J309 Allergic rhinitis, unspecified: Secondary | ICD-10-CM | POA: Diagnosis not present

## 2016-07-05 ENCOUNTER — Emergency Department (HOSPITAL_BASED_OUTPATIENT_CLINIC_OR_DEPARTMENT_OTHER): Payer: Medicare HMO

## 2016-07-05 ENCOUNTER — Encounter (HOSPITAL_BASED_OUTPATIENT_CLINIC_OR_DEPARTMENT_OTHER): Payer: Self-pay | Admitting: Emergency Medicine

## 2016-07-05 ENCOUNTER — Emergency Department (HOSPITAL_BASED_OUTPATIENT_CLINIC_OR_DEPARTMENT_OTHER)
Admission: EM | Admit: 2016-07-05 | Discharge: 2016-07-05 | Disposition: A | Discharge status: Home / Self Care | Payer: Medicare HMO | Attending: Emergency Medicine | Admitting: Emergency Medicine

## 2016-07-05 DIAGNOSIS — J329 Chronic sinusitis, unspecified: Secondary | ICD-10-CM | POA: Insufficient documentation

## 2016-07-05 DIAGNOSIS — Z87891 Personal history of nicotine dependence: Secondary | ICD-10-CM | POA: Diagnosis not present

## 2016-07-05 DIAGNOSIS — Z79899 Other long term (current) drug therapy: Secondary | ICD-10-CM | POA: Insufficient documentation

## 2016-07-05 DIAGNOSIS — I10 Essential (primary) hypertension: Secondary | ICD-10-CM | POA: Insufficient documentation

## 2016-07-05 DIAGNOSIS — Z7982 Long term (current) use of aspirin: Secondary | ICD-10-CM | POA: Insufficient documentation

## 2016-07-05 DIAGNOSIS — J208 Acute bronchitis due to other specified organisms: Secondary | ICD-10-CM | POA: Diagnosis not present

## 2016-07-05 DIAGNOSIS — J209 Acute bronchitis, unspecified: Secondary | ICD-10-CM | POA: Diagnosis not present

## 2016-07-05 DIAGNOSIS — J40 Bronchitis, not specified as acute or chronic: Secondary | ICD-10-CM

## 2016-07-05 DIAGNOSIS — R0602 Shortness of breath: Secondary | ICD-10-CM | POA: Diagnosis not present

## 2016-07-05 DIAGNOSIS — J3489 Other specified disorders of nose and nasal sinuses: Secondary | ICD-10-CM

## 2016-07-05 LAB — CBC
HEMATOCRIT: 42.8 % (ref 36.0–46.0)
HEMOGLOBIN: 14.2 g/dL (ref 12.0–15.0)
MCH: 29.8 pg (ref 26.0–34.0)
MCHC: 33.2 g/dL (ref 30.0–36.0)
MCV: 89.9 fL (ref 78.0–100.0)
Platelets: 270 10*3/uL (ref 150–400)
RBC: 4.76 MIL/uL (ref 3.87–5.11)
RDW: 13.2 % (ref 11.5–15.5)
WBC: 8.8 10*3/uL (ref 4.0–10.5)

## 2016-07-05 LAB — COMPREHENSIVE METABOLIC PANEL
ALBUMIN: 4.1 g/dL (ref 3.5–5.0)
ALK PHOS: 49 U/L (ref 38–126)
ALT: 12 U/L — AB (ref 14–54)
AST: 16 U/L (ref 15–41)
Anion gap: 8 (ref 5–15)
BILIRUBIN TOTAL: 0.6 mg/dL (ref 0.3–1.2)
BUN: 10 mg/dL (ref 6–20)
CALCIUM: 9.1 mg/dL (ref 8.9–10.3)
CO2: 28 mmol/L (ref 22–32)
CREATININE: 0.98 mg/dL (ref 0.44–1.00)
Chloride: 104 mmol/L (ref 101–111)
GFR calc Af Amer: >60 mL/min (ref >60)
GFR, EST NON AFRICAN AMERICAN: 55 mL/min — AB (ref >60)
GLUCOSE: 93 mg/dL (ref 65–99)
POTASSIUM: 4.1 mmol/L (ref 3.5–5.1)
Sodium: 140 mmol/L (ref 135–145)
TOTAL PROTEIN: 7 g/dL (ref 6.5–8.1)

## 2016-07-05 LAB — TROPONIN I

## 2016-07-05 NOTE — ED Provider Notes (Signed)
[facility] Provider Note   CSN: [id number] Arrival date & time: 07/05/16  1154     History   Chief Complaint Chief Complaint  Patient presents with  . Shortness of Breath    HPI Marissa Dodson is a 76 y.o. female.  HPI Patient is a 76 year old female who complains of ongoing sinus pressure despite multiple course of antibiotics.  She has scheduled ENT follow-up.  She denies fevers and chills.  She reports new productive cough which began yesterday with some shortness of breath.  She denies chest pain or chest pressure.  Reports nausea without vomiting.  Denies diarrhea.  No black stools.  Symptoms are mild in severity.  No other complaints at this time.  No prior history of DVT or pulmonary embolism.   Past Medical History:  Diagnosis Date  . Acute meniscal injury of left knee   . Complication of anesthesia    hypotention  . Constipation   . GERD (gastroesophageal reflux disease)   . Hypertension   . Hypoglycemia, unspecified   . IC (interstitial cystitis)   . Sleep apnea    STOP-Bang =  5  . Swelling of left knee joint     Patient Active Problem List   Diagnosis Date Noted  . Acute medial meniscal tear 10/08/2012    Past Surgical History:  Procedure Laterality Date  . KNEE ARTHROSCOPY Right 2009  . KNEE ARTHROSCOPY Left 10/09/2012   Procedure: LEFT ARTHROSCOPY KNEE WITH medial meiscal DEBRIDEMENT;  Surgeon: Loanne DrillingFrank V Aluisio, MD;  [facility] [place];  Service: Orthopedics;  Laterality: Left;  . LAPAROSCOPIC LYSIS OF ADHESIONS  1980'S  . TONSILLECTOMY AND ADENOIDECTOMY  AGE 25  . TOTAL ABDOMINAL HYSTERECTOMY W/ BILATERAL SALPINGOOPHORECTOMY  AGE 50    OB History    No data available       Home Medications    Prior to Admission medications   Medication Sig Start Date End Date Taking? Authorizing Provider  amitriptyline (ELAVIL) 25 MG tablet Take 50 mg by mouth at bedtime.    [provider]  aspirin 81 MG tablet  Take 81 mg by mouth daily.    [provider]  atenolol (TENORMIN) 25 MG tablet Take 25 mg by mouth every morning.    [provider]  CASCARA SAGRADA PO Take by mouth daily.    [provider]  cetirizine (ZYRTEC) 10 MG tablet Take 10 mg by mouth daily.    [provider]  Cholecalciferol (VITAMIN D3) 2000 UNITS TABS Take 1 capsule by mouth daily.    [provider]  furosemide (LASIX) 40 MG tablet Take 20-40 mg by mouth daily. Takes 20mg  daily if increased fluid retention takes 40mg     [provider]  ibuprofen (ADVIL,MOTRIN) 200 MG tablet Take 600 mg by mouth 2 (two) times daily. And prn    [provider]  omeprazole (PRILOSEC) 40 MG capsule Take 40 mg by mouth every morning.    [provider]  potassium chloride SA (K-DUR,KLOR-CON) 20 MEQ tablet Take 10-20 mEq by mouth daily. Takes 10meq daily and if taking 40mg  lasix will take 20meq    [provider]    Family History History reviewed. No pertinent family history.  Social History Social History  Substance Use Topics  . Smoking status: Former Smoker    Packs/day: 0.50    Years: 20.00    Types: Cigarettes    Quit date: 10/02/1992  . Smokeless tobacco: Never Used  .  Alcohol use No     Allergies   Ketek [telithromycin]; Cephalosporins; Codeine; and Flagyl [metronidazole]   Review of Systems Review of Systems  All other systems reviewed and are negative.    Physical Exam Updated Vital Signs BP 122/74 (BP Location: Left Arm)   Pulse 76   Temp 99.1 F (37.3 C) (Oral)   Ht 5\' 4"  (1.626 m)   Wt 92.1 kg (203 lb)   SpO2 100%   BMI 34.84 kg/m   Physical Exam  Constitutional: She is oriented to person, place, and time. She appears well-developed and well-nourished. No distress.  HENT:  Head: Normocephalic and atraumatic.  Eyes: EOM are normal.  Neck: Normal range of motion.  Cardiovascular: Normal rate and regular rhythm.     Pulmonary/Chest: Effort normal and breath sounds normal.  Abdominal: Soft. She exhibits no distension. There is no tenderness.  Musculoskeletal: Normal range of motion.  Neurological: She is alert and oriented to person, place, and time.  Skin: Skin is warm and dry.  Psychiatric: She has a normal mood and affect. Judgment normal.  Nursing note and vitals reviewed.    ED Treatments / Results  Labs (all labs ordered are listed, but only abnormal results are displayed) Labs Reviewed  COMPREHENSIVE METABOLIC PANEL - Abnormal; Notable for the following:       Result Value   ALT 12 (*)    GFR calc non Af Amer 55 (*)    All other components within normal limits  CBC  TROPONIN I    EKG  EKG Interpretation  Date/Time:  Friday July 05 2016 12:08:20 EDT Ventricular Rate:  69 PR Interval:    QRS Duration: 99 QT Interval:  397 QTC Calculation: 426 R Axis:   -10 Text Interpretation:  Sinus rhythm Low voltage, precordial leads No significant change was found Confirmed by Azalia Bilis ([id number]) on 07/05/2016 1:50:18 PM       Radiology Dg Chest 2 View  Result Date: 07/05/2016 CLINICAL DATA:  Shortness of breath for 2 days EXAM: CHEST  2 VIEW COMPARISON:  05/26/2015 FINDINGS: The heart size and mediastinal contours are within normal limits. Both lungs are clear. The visualized skeletal structures show degenerative change of the thoracic spine. IMPRESSION: No active cardiopulmonary disease. Electronically Signed   By: Alcide Clever M.D.   On: 07/05/2016 12:34    Procedures Procedures (including critical care time)  Medications Ordered in ED Medications - No data to display   Initial Impression / Assessment and Plan / ED Course  I have reviewed the triage vital signs and the nursing notes.  Pertinent labs & imaging results that were available during my care of the patient were reviewed by me and considered in my medical decision making (see chart for details).     Patient is  overall well-appearing.  She is without symptoms at this time.  Suspect bronchitis.  No wheezing on exam.  Doubt ACS.  Doubt PE.  Vital signs are stable.  Pulse ox 100%.  Close primary care follow-up.    Final Clinical Impressions(s) / ED Diagnoses   Final diagnoses:  Sinus pressure  Bronchitis    New Prescriptions New Prescriptions   No medications on file     Azalia Bilis, MD 07/05/16 1350

## 2016-07-05 NOTE — ED Notes (Signed)
ED Provider at bedside. 

## 2016-07-05 NOTE — ED Triage Notes (Signed)
Patient reports shortness of breath which began yesterday.  Reports nausea and dizziness. Denies chest pain, vomiting.  States activity worsens shortness of breath.

## 2016-07-17 DIAGNOSIS — J019 Acute sinusitis, unspecified: Secondary | ICD-10-CM | POA: Diagnosis not present

## 2016-07-17 DIAGNOSIS — J309 Allergic rhinitis, unspecified: Secondary | ICD-10-CM | POA: Diagnosis not present

## 2016-07-18 ENCOUNTER — Encounter: Payer: Self-pay | Admitting: Neurology

## 2016-07-18 ENCOUNTER — Encounter: Payer: Self-pay | Admitting: Psychology

## 2016-07-18 ENCOUNTER — Ambulatory Visit (INDEPENDENT_AMBULATORY_CARE_PROVIDER_SITE_OTHER): Payer: Medicare HMO | Admitting: Neurology

## 2016-07-18 VITALS — BP 137/75 | HR 75 | Ht 64.0 in | Wt 211.0 lb

## 2016-07-18 DIAGNOSIS — R413 Other amnesia: Secondary | ICD-10-CM | POA: Diagnosis not present

## 2016-07-18 DIAGNOSIS — E538 Deficiency of other specified B group vitamins: Secondary | ICD-10-CM

## 2016-07-18 HISTORY — DX: Other amnesia: R41.3

## 2016-07-18 MED ORDER — ALPRAZOLAM 0.5 MG PO TABS: ORAL_TABLET | ORAL | 0 refills | Status: DC
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
## 2016-07-18 NOTE — Patient Instructions (Signed)
   We will get blood work today and get MRI of the brain and neuropsychological testing. 

## 2016-07-19 ENCOUNTER — Telehealth: Payer: Self-pay | Admitting: *Deleted

## 2016-07-19 LAB — RPR: RPR: NONREACTIVE

## 2016-07-19 LAB — VITAMIN B12: Vitamin B-12: 387 pg/mL (ref 232–1245)

## 2016-07-19 LAB — SEDIMENTATION RATE: Sed Rate: 2 mm/hr (ref 0–40)

## 2016-07-19 NOTE — Telephone Encounter (Signed)
Called and LVM about unremarkable labs per CW,MD note. Gave [facility] phone number if she has further questions or concerns.

## 2016-07-19 NOTE — Telephone Encounter (Signed)
Pt called back, she was made aware of the results of her labs and had no questions.

## 2016-07-19 NOTE — Telephone Encounter (Signed)
-----   Message from York Spanielharles K Willis, MD sent at 07/19/2016  7:28 AM EDT -----   The blood work results are unremarkable. Please call the patient.  ----- Message ----- From: Nell RangeInterface, [facility] Lab Results In Sent: 07/19/2016   5:41 AM To: York Spanielharles K Willis, MD

## 2016-07-19 NOTE — Telephone Encounter (Signed)
Noted, thank you

## 2016-08-14 ENCOUNTER — Other Ambulatory Visit: Payer: Medicare HMO

## 2016-08-24 ENCOUNTER — Ambulatory Visit
Admission: RE | Admit: 2016-08-24 | Discharge: 2016-08-24 | Disposition: A | Discharge status: Home / Self Care | Payer: Medicare HMO | Source: Ambulatory Visit | Attending: Neurology | Admitting: Neurology

## 2016-08-24 DIAGNOSIS — R413 Other amnesia: Secondary | ICD-10-CM | POA: Diagnosis not present

## 2016-08-26 ENCOUNTER — Telehealth: Payer: Self-pay | Admitting: Neurology

## 2016-08-26 NOTE — Telephone Encounter (Signed)
I called the patient. The MRI of the brain is normal. The patient is to have neuropsychological testing done.   MRI brain 08/26/16:  IMPRESSION:  This is a normal age-appropriate MRI of the brain without contrast. There is minimal chronic microvascular ischemic change and minimal cortical atrophy. There are no acute findings.

## 2016-09-13 IMAGING — CR DG CHEST 2V
2 series · 2 of 2 positions shown · non-contrast
Comparison: 05/12/2015

CLINICAL DATA: Left breast pain, left shoulder pain.  Chest pain.

EXAM:
CHEST  2 VIEW

[w chest pa]
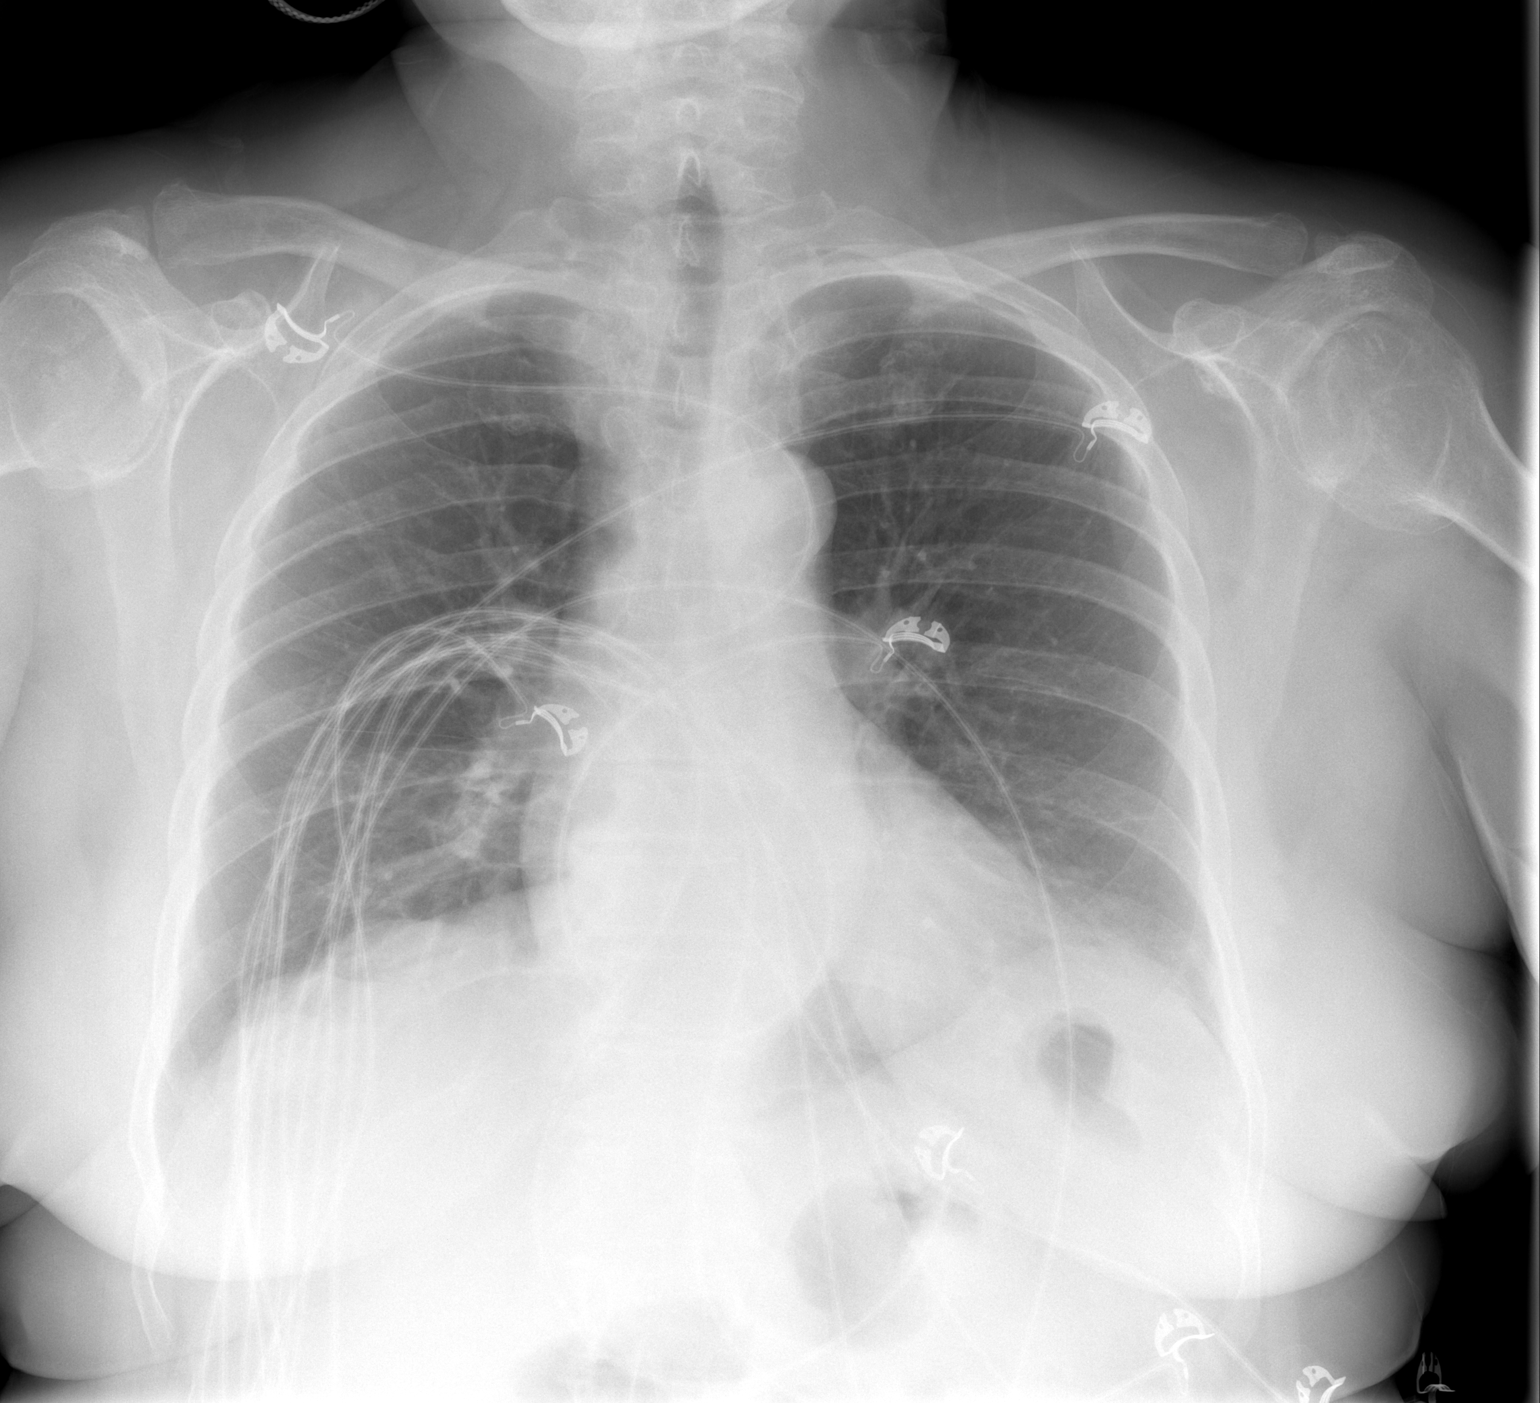

[w chest lat]
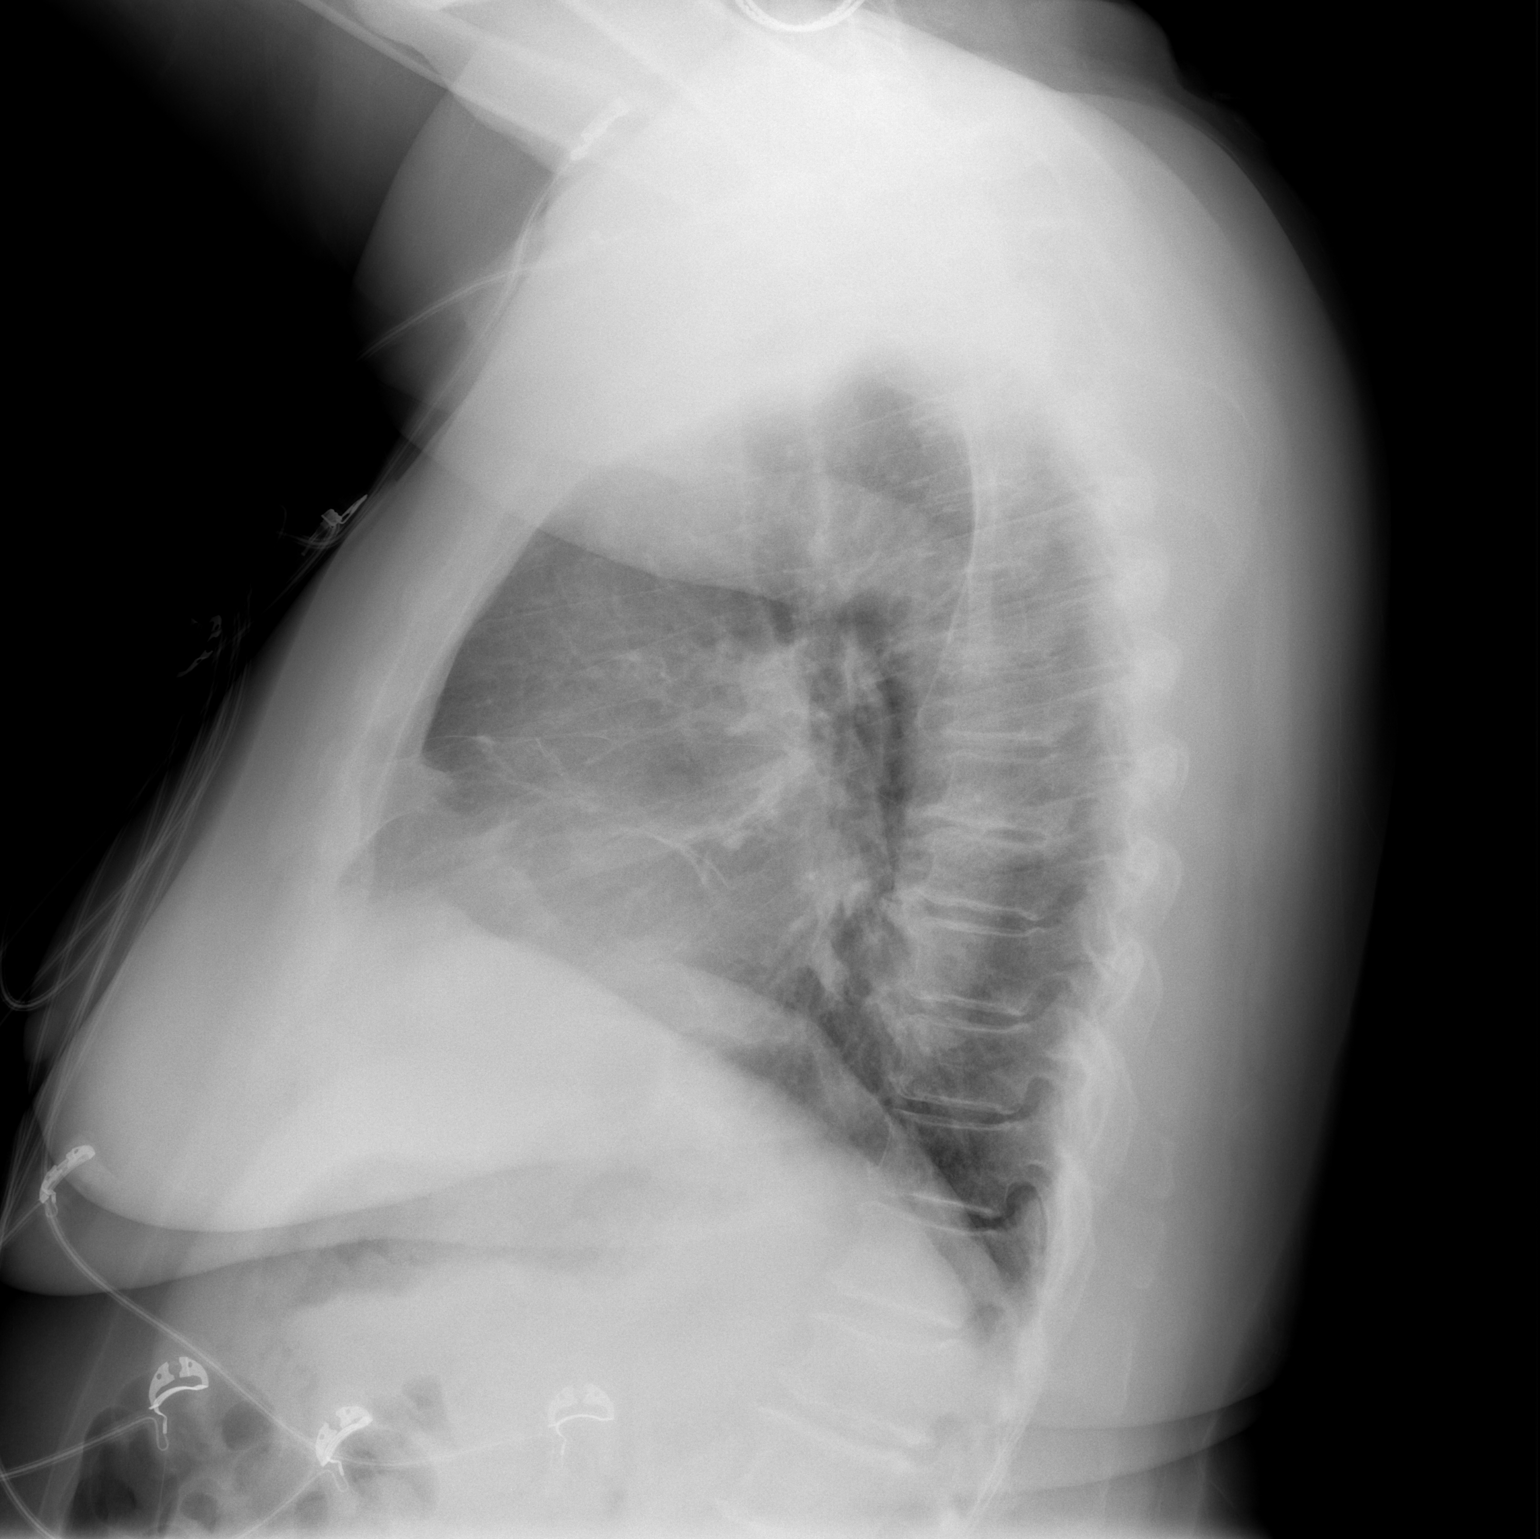

[2 of 2 positions shown; findings below may reference images not displayed]

FINDINGS: Lingular density again noted, likely scarring or atelectasis. No
confluent opacity on the right. No effusions. Heart is normal size.
No acute bony abnormality.
IMPRESSION: Lingular scarring or atelectasis, stable.

No active disease.

## 2016-09-23 DIAGNOSIS — M5416 Radiculopathy, lumbar region: Secondary | ICD-10-CM | POA: Insufficient documentation

## 2016-10-07 ENCOUNTER — Ambulatory Visit (INDEPENDENT_AMBULATORY_CARE_PROVIDER_SITE_OTHER): Payer: Medicare HMO | Admitting: Psychology

## 2016-10-07 ENCOUNTER — Encounter: Payer: Self-pay | Admitting: Psychology

## 2016-10-07 DIAGNOSIS — R413 Other amnesia: Secondary | ICD-10-CM

## 2016-10-07 DIAGNOSIS — F4310 Post-traumatic stress disorder, unspecified: Secondary | ICD-10-CM
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
## 2016-10-07 NOTE — Progress Notes (Signed)
   Neuropsychology Note  Marissa LoftLauretta S Betzler came in today for 1 hour of neuropsychological testing with technician, Wallace Kellerana Chamberlain, BS, under the supervision of Dr. Elvis CoilMaryBeth Bailar. The patient did not appear overtly distressed by the testing session, per behavioral observation or via self-report to the technician. Rest breaks were offered. Marissa Dodson will return within 2 weeks for a feedback session with Dr. Alinda DoomsBailar at which time her test performances, clinical impressions and treatment recommendations will be reviewed in detail. The patient understands she can contact our office should she require our assistance before this time.  Full report to follow.
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
## 2016-10-15 ENCOUNTER — Telehealth: Payer: Self-pay | Admitting: Neurology

## 2016-10-15 ENCOUNTER — Encounter: Payer: Self-pay | Admitting: Psychology

## 2016-10-15 ENCOUNTER — Ambulatory Visit (INDEPENDENT_AMBULATORY_CARE_PROVIDER_SITE_OTHER): Payer: Medicare HMO | Admitting: Psychology

## 2016-10-15 DIAGNOSIS — R413 Other amnesia: Secondary | ICD-10-CM

## 2016-10-15 DIAGNOSIS — F419 Anxiety disorder, unspecified: Secondary | ICD-10-CM

## 2016-10-15 NOTE — Patient Instructions (Addendum)
Fortunately, results of cognitive testing were entirely within normal limits and NOT indicative of a neurocognitive disorder.   It is most likely the case that your cognitive symptoms in daily life are due to depression and anxiety.   Below is some more information on this.  I think more aggressive treatment of depression and anxiety (e.g. Psychotherapy and/or medication) would not only improve your mood and coping, but also result in improved cognitive functioning in your daily life.    The effect of depression and anxiety on your cognitive functioning: . One of the typical symptoms of depression is difficulty concentrating and making decisions, and various types of anxiety also interfere with attention and concentration . Problems with attention and concentration can disrupt the process of learning and making new memories, which can make it seem like there is a problem with your memory. In your daily life, you may experience this disruption as forgetting names and appointments, misplacing items, and needing to make lists for shopping and errands. It may be harder for you to stay focused on tasks and feel as "sharp" as you did in the past.  . Also, when we are depressed or anxious, we often pay more attention to our difficulties (rather than our strengths) in our daily life, and this can make it seem to [place] like we are doing worse cognitively than we really are. . The cognitive aspects of depression and anxiety are sometimes observed as an identifiable pattern of poor performance on a neuropsychological evaluation, but it is also possible that all scores on an evaluation are within normal limits. . Regardless of the test scores, distress related to depression and anxiety can interfere with the ability to make use of your cognitive resources and function optimally across settings such as work or school, maintaining the home and responsibilities, and personal relationships. . Fortunately, there are  treatments for depression and anxiety, and when mood improves, cognitive functioning in daily life often improves. . Treatment options include psychotherapy, medications (e.g., antidepressants), and behavioral changes, such as increasing your involvement in enjoyable activities, increasing the amount of exercise you are getting, and maintaining a regular routine.
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
## 2016-11-14 ENCOUNTER — Ambulatory Visit: Payer: Medicare HMO | Admitting: Psychology

## 2017-02-04 ENCOUNTER — Ambulatory Visit: Payer: Medicare HMO | Admitting: Adult Health

## 2017-02-11 DIAGNOSIS — H2510 Age-related nuclear cataract, unspecified eye: Secondary | ICD-10-CM | POA: Insufficient documentation

## 2017-04-01 ENCOUNTER — Ambulatory Visit: Payer: Medicare HMO | Admitting: Adult Health

## 2017-04-01 ENCOUNTER — Encounter: Payer: Self-pay | Admitting: Adult Health

## 2017-04-01 VITALS — BP 142/81 | HR 79 | Wt 214.2 lb

## 2017-04-01 DIAGNOSIS — R413 Other amnesia: Secondary | ICD-10-CM

## 2017-04-01 DIAGNOSIS — F329 Major depressive disorder, single episode, unspecified: Secondary | ICD-10-CM

## 2017-04-01 DIAGNOSIS — F32A Depression, unspecified: Secondary | ICD-10-CM

## 2017-04-01 NOTE — Progress Notes (Signed)
I have read the note, and I agree with the clinical assessment and plan.  Marissa Dodson   

## 2017-04-01 NOTE — Patient Instructions (Signed)
Your Plan:  Make appt with [facility] as Dr. Alinda DoomsBailar heath suggested or follow up with PCP for Depression. Suggest calling PCP about amitriptyline dose.  If your symptoms worsen or you develop new symptoms please let us know.      Thank you for coming to see [facility] at [facility]. I hope we have been able to provide you high quality care today.  You may receive a patient satisfaction survey over the next few weeks. We would appreciate your feedback and comments so that we may continue to improve ourselves and the health of our patients.
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
## 2017-04-14 ENCOUNTER — Telehealth: Payer: Self-pay | Admitting: Psychology

## 2017-04-14 DIAGNOSIS — F419 Anxiety disorder, unspecified: Secondary | ICD-10-CM

## 2017-04-14 NOTE — Telephone Encounter (Signed)
Hi Dr. Alinda DoomsBailar  This patient called needing to speak with you. She saw you in  September of 2018. She is also a Patient of [facility]. She is calling regarding a referral? She said she saw Dr. Anne HahnWillis last week and nothing was said regarding a referral. She is unsure of what she needs to do. Please Advise.   Thanks Walgreen[facility]andy

## 2017-04-15 NOTE — Progress Notes (Signed)
Per notes, I see the patient cancelled referral to [facility] that I had placed for her back in September, but she is open to counseling now (reports increased depression/stress). I will put in another order for her.

## 2017-04-23 ENCOUNTER — Telehealth: Payer: Self-pay | Admitting: Psychology

## 2017-04-23 NOTE — Telephone Encounter (Signed)
Patient is returning your call.  

## 2017-06-16 DIAGNOSIS — B009 Herpesviral infection, unspecified: Secondary | ICD-10-CM | POA: Insufficient documentation

## 2017-06-16 DIAGNOSIS — J309 Allergic rhinitis, unspecified: Secondary | ICD-10-CM | POA: Insufficient documentation

## 2017-09-15 DIAGNOSIS — R519 Headache, unspecified: Secondary | ICD-10-CM | POA: Insufficient documentation

## 2017-10-16 DIAGNOSIS — K5909 Other constipation: Secondary | ICD-10-CM | POA: Insufficient documentation

## 2017-10-24 IMAGING — CR DG CHEST 2V
2 series · 2 of 2 positions shown · non-contrast
Comparison: 05/26/2015

CLINICAL DATA: Shortness of breath for 2 days

EXAM:
CHEST  2 VIEW

[w chest pa]
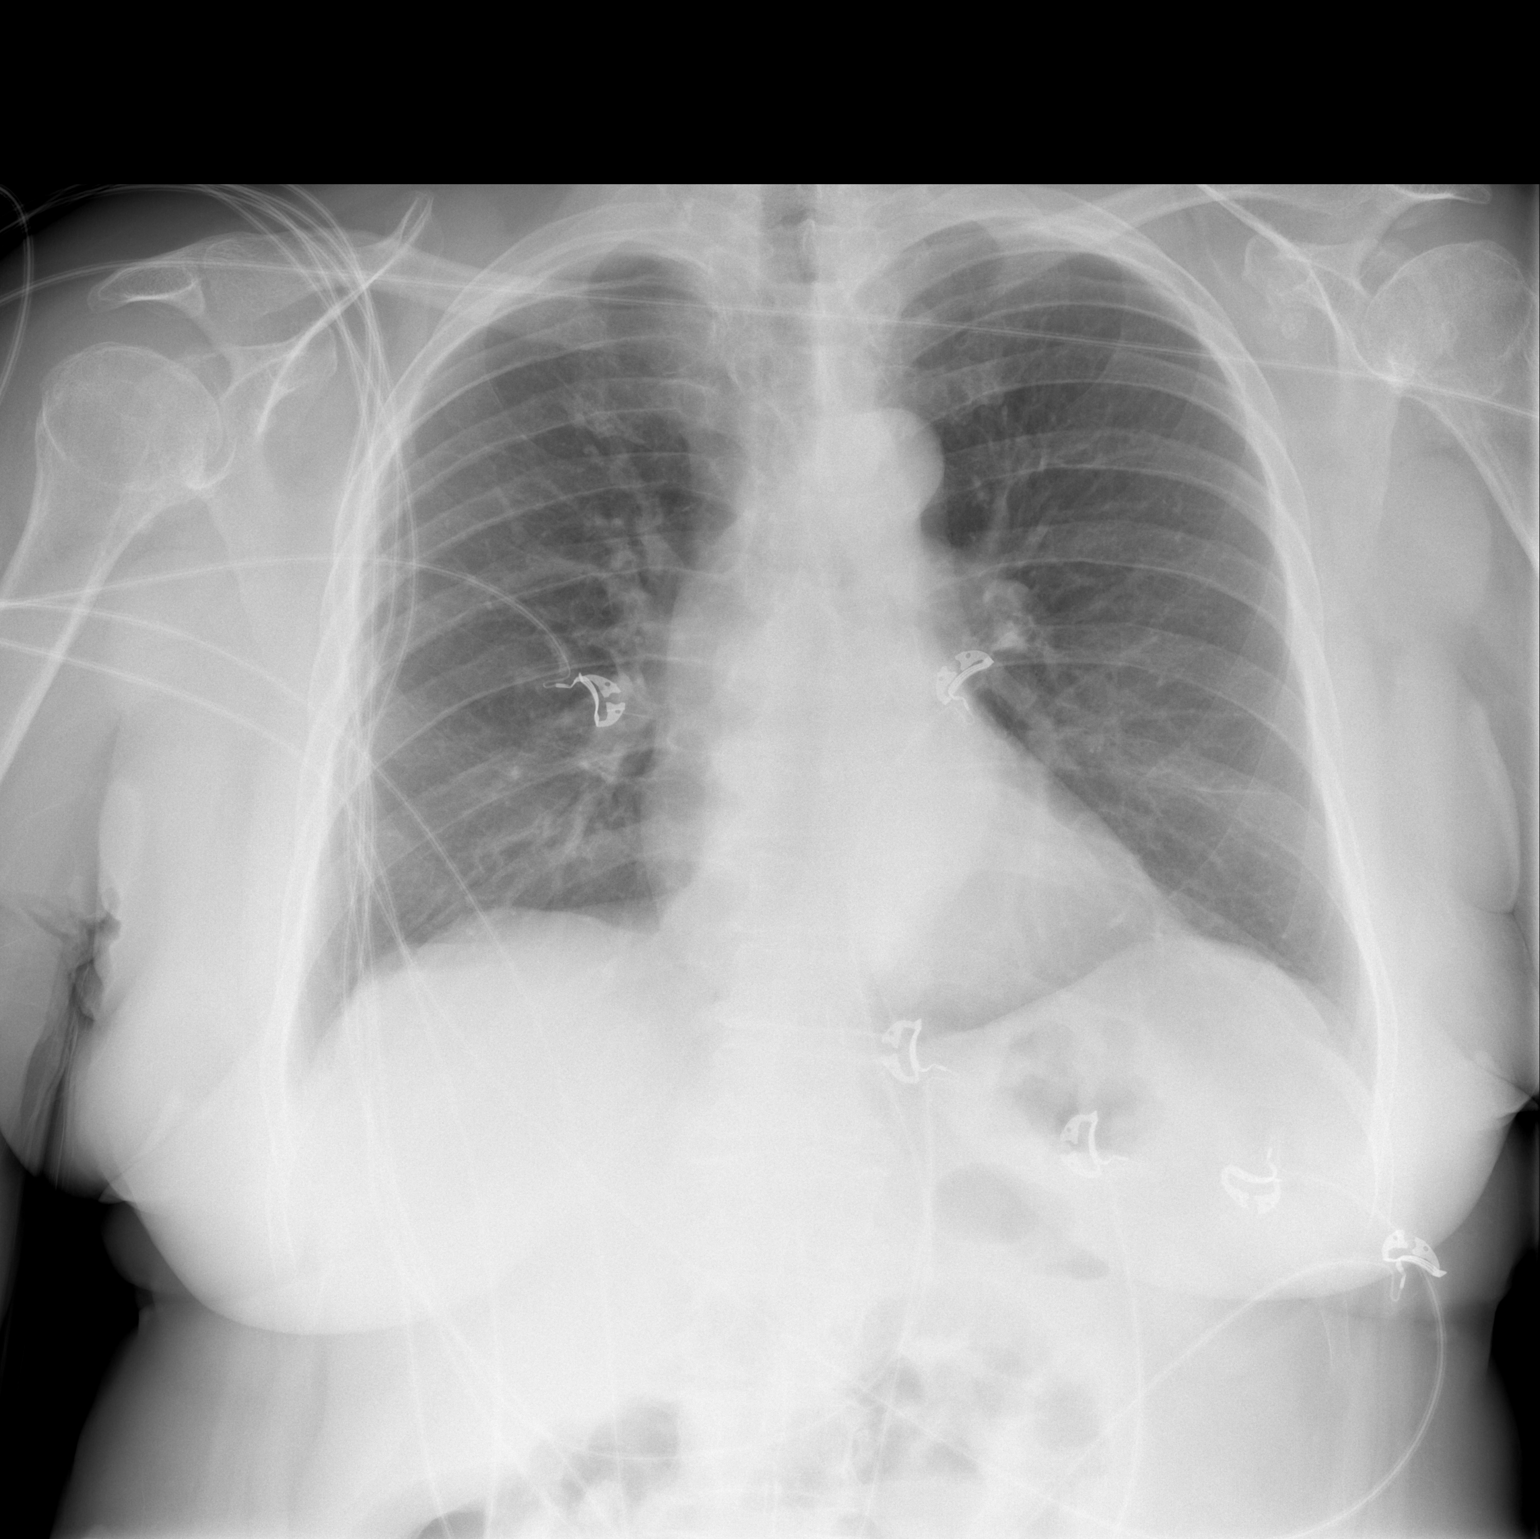

[w chest lat]
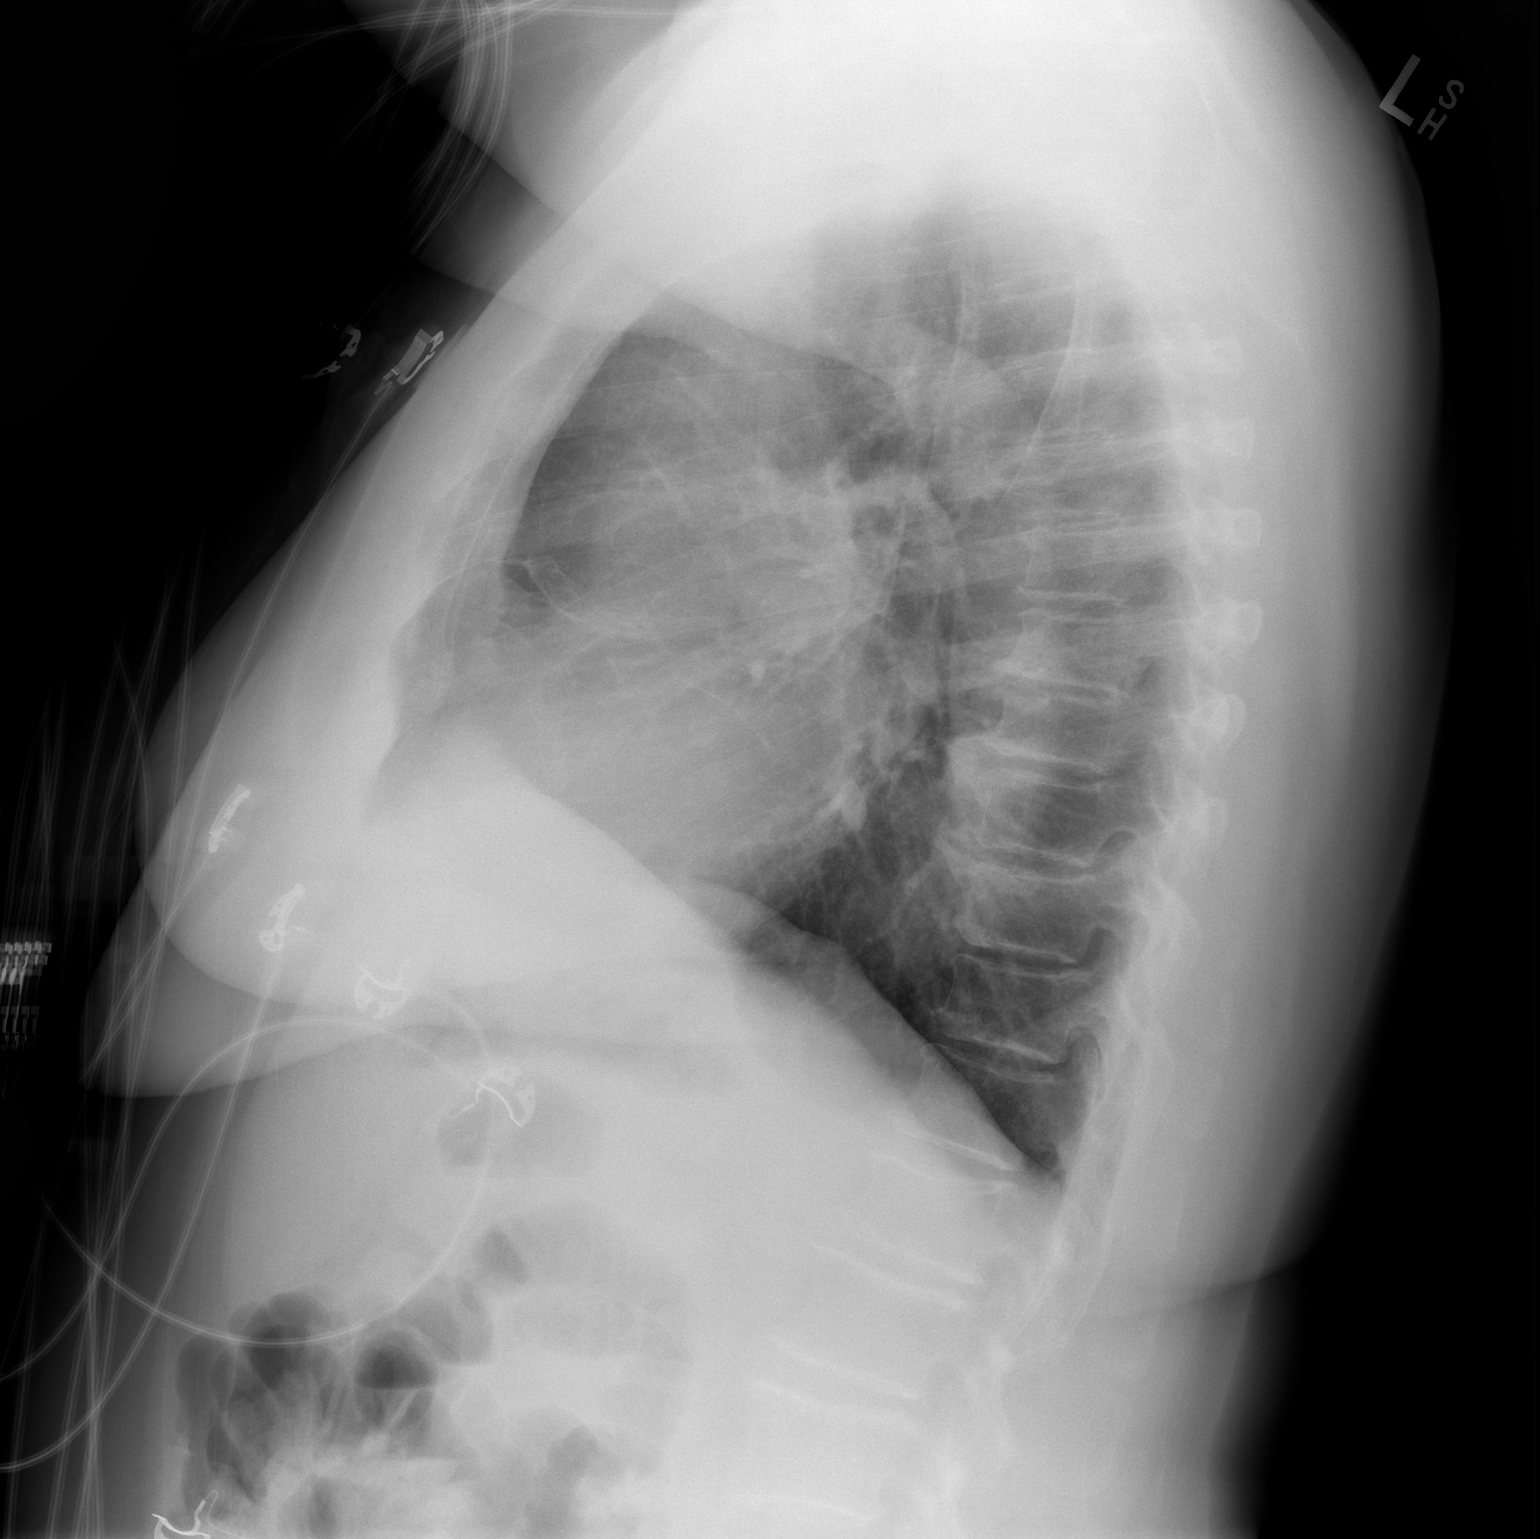

[2 of 2 positions shown; findings below may reference images not displayed]

FINDINGS: The heart size and mediastinal contours are within normal limits.
Both lungs are clear. The visualized skeletal structures show
degenerative change of the thoracic spine.
IMPRESSION: No active cardiopulmonary disease.

## 2018-02-25 ENCOUNTER — Other Ambulatory Visit: Payer: Self-pay | Admitting: Nurse Practitioner

## 2019-03-14 ENCOUNTER — Ambulatory Visit: Payer: Medicare HMO | Attending: Internal Medicine

## 2019-03-14 DIAGNOSIS — Z23 Encounter for immunization: Secondary | ICD-10-CM | POA: Insufficient documentation

## 2019-03-14 NOTE — Progress Notes (Signed)
   Covid-19 Vaccination Clinic  Name:  KAROLEE MELONI    MRN: [id number] DOB: [birth year]  03/14/2019  Ms. Miyasaki was observed post Covid-19 immunization for 15 minutes without incidence. She was provided with Vaccine Information Sheet and instruction to access the V-Safe system.   Ms. Mott was instructed to call 911 with any severe reactions post vaccine: Marland Kitchen Difficulty breathing  . Swelling of your face and throat  . A fast heartbeat  . A bad rash all over your body  . Dizziness and weakness    Immunizations Administered    Name Date Dose VIS Date Route   Pfizer COVID-19 Vaccine 03/14/2019 12:29 PM 0.3 mL 01/08/2019 Intramuscular   Manufacturer: [employer], [employer]   Lot: [id number]   NDC: [address]-4

## 2019-04-06 ENCOUNTER — Ambulatory Visit: Payer: Medicare HMO | Attending: Internal Medicine

## 2019-04-06 DIAGNOSIS — Z23 Encounter for immunization: Secondary | ICD-10-CM | POA: Insufficient documentation

## 2019-04-06 NOTE — Progress Notes (Signed)
   Covid-19 Vaccination Clinic  Name:  Marissa Dodson    MRN: [id number] DOB: 06/11/1940  04/06/2019  Marissa Dodson was observed post Covid-19 immunization for 15 minutes without incident. She was provided with Vaccine Information Sheet and instruction to access the V-Safe system.   Marissa Dodson was instructed to call 911 with any severe reactions post vaccine: Marland Kitchen Difficulty breathing  . Swelling of face and throat  . A fast heartbeat  . A bad rash all over body  . Dizziness and weakness   Immunizations Administered    Name Date Dose VIS Date Route   Pfizer COVID-19 Vaccine 04/06/2019  5:09 PM 0.3 mL 01/08/2019 Intramuscular   Manufacturer: [employer], [employer]   Lot: [id number]   NDC: [address]-1

## 2019-05-16 DIAGNOSIS — E785 Hyperlipidemia, unspecified: Secondary | ICD-10-CM | POA: Diagnosis present

## 2019-05-16 HISTORY — DX: Hyperlipidemia, unspecified: E78.5

## 2019-05-31 ENCOUNTER — Telehealth: Payer: Self-pay

## 2019-05-31 NOTE — Telephone Encounter (Signed)
NOTES ON FILE FROM [facility] [phone], SENT REFERRAL TO SCHEDULING

## 2019-06-17 DIAGNOSIS — M25552 Pain in left hip: Secondary | ICD-10-CM | POA: Insufficient documentation

## 2019-06-17 DIAGNOSIS — M25562 Pain in left knee: Secondary | ICD-10-CM | POA: Insufficient documentation

## 2019-06-17 DIAGNOSIS — M25561 Pain in right knee: Secondary | ICD-10-CM | POA: Insufficient documentation

## 2019-08-09 NOTE — Progress Notes (Signed)
Cardiology Office Note:    Date:  08/11/2019   ID:  Marissa Dodson, DOB March 12, 1940, MRN [id number]  PCP:  Gillian Scarce, MD  Cardiologist:  No primary care provider on file.   Referring MD: Gillian Scarce, MD   Chief Complaint  Patient presents with  . Congestive Heart Failure    Rule out    History of Present Illness:    Marissa Dodson is a 79 y.o. female with a hx of  Hypertension, edema, and weakness. Referred by Primary physician Dr. Birdena Jubilee.  The patient complains of difficulty lying flat because of shortness of breath, generalized swelling not only in the legs but in hands, arms, and face.  She denies chest discomfort.  She relates a history of a significant medical illness when she was 79 years of age.  It was associated with swelling and kidney disease.  She does not have a specific diagnosis.  Concern now is related to weather a significant heart problem could be developing.  Past Medical History:  Diagnosis Date  . Acute meniscal injury of left knee   . Complication of anesthesia    hypotention  . Constipation   . GERD (gastroesophageal reflux disease)   . Hypertension   . Hypoglycemia, unspecified   . IC (interstitial cystitis)   . Memory difficulties 07/18/2016  . Sleep apnea    STOP-Bang =  5  . Swelling of left knee joint     Past Surgical History:  Procedure Laterality Date  . KNEE ARTHROSCOPY Right 2009  . KNEE ARTHROSCOPY Left 10/09/2012   Procedure: LEFT ARTHROSCOPY KNEE WITH medial meiscal DEBRIDEMENT;  Surgeon: Loanne Drilling, MD;  Location: [facility] [place];  Service: Orthopedics;  Laterality: Left;  . LAPAROSCOPIC LYSIS OF ADHESIONS  1980'S  . TONSILLECTOMY AND ADENOIDECTOMY  AGE 13  . TOTAL ABDOMINAL HYSTERECTOMY W/ BILATERAL SALPINGOOPHORECTOMY  AGE 28    Current Medications: Current Meds  Medication Sig  . amitriptyline (ELAVIL) 25 MG tablet Take 50 mg by mouth at bedtime.  Marland Kitchen aspirin 81 MG chewable tablet Chew 81 mg  by mouth daily.  Marland Kitchen atenolol (TENORMIN) 25 MG tablet Take 25 mg by mouth every morning.  Marland Kitchen CASCARA SAGRADA PO Take by mouth as needed.   . cetirizine (ZYRTEC) 10 MG tablet Take 10 mg by mouth daily.  . Cholecalciferol (VITAMIN D3) 2000 UNITS TABS Take 1 capsule by mouth daily.  . fluticasone (FLONASE) 50 MCG/ACT nasal spray   . furosemide (LASIX) 40 MG tablet Take 20-40 mg by mouth daily. Takes 20mg  daily if increased fluid retention takes 40mg   . ibuprofen (ADVIL,MOTRIN) 200 MG tablet Take 600 mg by mouth once a week. And prn   . omeprazole (PRILOSEC) 20 MG capsule Take 20 mg by mouth 2 (two) times daily before a meal.   . potassium chloride SA (K-DUR,KLOR-CON) 20 MEQ tablet Take 20 mEq by mouth daily. Takes daily and if taking 40mg  lasix will take      Allergies:   Ketek [telithromycin], Metoprolol, Cephalosporins, Codeine, Flagyl [metronidazole], and Vistaril [hydroxyzine]   Social History   Socioeconomic History  . Marital status: Legally Separated    Spouse name: Not on file  . Number of children: 3  . Years of education: 57  . Highest education level: Not on file  Occupational History  . Occupation: Retired  Tobacco Use  . Smoking status: Former Smoker    Packs/day: 0.50    Years: 20.00  Pack years: 10.00    Types: Cigarettes    Quit date: 10/02/1992    Years since quitting: 26.8  . Smokeless tobacco: Never Used  Vaping Use  . Vaping Use: Never used  Substance and Sexual Activity  . Alcohol use: No  . Drug use: No  . Sexual activity: Not on file  Other Topics Concern  . Not on file  Social History Narrative   Lives alone   Caffeine use: Drinks water and coffee daily   Soda rare   Right handed    Social Determinants of Health   Financial Resource Strain:   . Difficulty of Paying Living Expenses:   Food Insecurity:   . Worried About Programme researcher, broadcasting/film/video in the Last Year:   . Barista in the Last Year:   Transportation Needs:   . Automotive engineer (Medical):   Marland Kitchen Lack of Transportation (Non-Medical):   Physical Activity:   . Days of Exercise per Week:   . Minutes of Exercise per Session:   Stress:   . Feeling of Stress :   Social Connections:   . Frequency of Communication with Friends and Family:   . Frequency of Social Gatherings with Friends and Family:   . Attends Religious Services:   . Active Member of Clubs or Organizations:   . Attends Banker Meetings:   Marland Kitchen Marital Status:      Family History: The patient's family history includes Alzheimer's disease in her mother; Diabetes in her brother and sister; Heart disease in her mother; Liver disease in her brother; Prostate cancer in her father.  ROS:   Please see the history of present illness.    No history of chest pain.  She has allergic rhinitis, generalized weakness, does not sleep well.  Has hyperlipidemia has had difficulty with statins in the past.  All other systems reviewed and are negative.  EKGs/Labs/Other Studies Reviewed:    The following studies were reviewed today: Recent cardiac imaging  EKG:  EKG normal sinus rhythm, poor R wave progression V1 through V4, leftward axis.  Biatrial abnormality.  Recent Labs: No results found for requested labs within last 8760 hours.  Recent Lipid Panel No results found for: CHOL, TRIG, HDL, CHOLHDL, VLDL, LDLCALC, LDLDIRECT  Physical Exam:    VS:  BP 132/82   Pulse 77   Ht 5\' 3"  (1.6 m)   Wt 225 lb 9.6 oz (102.3 kg)   SpO2 95%   BMI 39.96 kg/m     Wt Readings from Last 3 Encounters:  08/11/19 225 lb 9.6 oz (102.3 kg)  04/01/17 214 lb 3.2 oz (97.2 kg)  07/18/16 211 lb (95.7 kg)     GEN: Obesity. No acute distress HEENT: Normal NECK: No JVD. LYMPHATICS: No lymphadenopathy CARDIAC:  RRR without murmur, gallop, with 1+ bilateral ankle edema. VASCULAR:  Normal Pulses. No bruits. RESPIRATORY:  Clear to auscultation without rales, wheezing or rhonchi  ABDOMEN: Soft, non-tender,  non-distended, No pulsatile mass, MUSCULOSKELETAL: No deformity  SKIN: Warm and dry NEUROLOGIC:  Alert and oriented x 3 PSYCHIATRIC:  Normal affect   ASSESSMENT:    1. SOB (shortness of breath)   2. Bilateral leg edema   3. Memory difficulties   4. Weakness generalized   5. Educated about COVID-19 virus infection   6. Other fatigue    PLAN:    In order of problems listed above:  1. Etiology is uncertain.  Could be related to physical deconditioning.  BNP will be performed as well a 2D Doppler echocardiogram to assess right and left heart function and wall thickness.  We will also check a TSH and comprehensive metabolic panel to rule out kidney malfunction.  Will screen for the presence or absence of significant coronary atherosclerosis by doing a coronary calcium score. 2. Not very prevalent today.  In absence of neck vein elevation does not seem that edema is related to cardiac abnormality but echo will be helpful. 3. Discussed 4. Not discussed 5. She has received the vaccine and is practicing social distancing. 6. Depending upon work-up, may need to consider a sleep study.   Medication Adjustments/Labs and Tests Ordered: Current medicines are reviewed at length with the patient today.  Concerns regarding medicines are outlined above.  Orders Placed This Encounter  Procedures  . CT CARDIAC SCORING  . Pro b natriuretic peptide  . Hepatic function panel  . Basic metabolic panel  . TSH  . EKG 12-Lead  . ECHOCARDIOGRAM COMPLETE   No orders of the defined types were placed in this encounter.   Patient Instructions  Medication Instructions:  Your physician recommends that you continue on your current medications as directed. Please refer to the Current Medication list given to you today.  *If you need a refill on your cardiac medications before your next appointment, please call your pharmacy*   Lab Work: Pro BNP, TSH, BMET and Liver today  If you have labs (blood work)  drawn today and your tests are completely normal, you will receive your results only by: Marland Kitchen. MyChart Message (if you have MyChart) OR . A paper copy in the mail If you have any lab test that is abnormal or we need to change your treatment, we will call you to review the results.   Testing/Procedures: Your physician has requested that you have an echocardiogram. Echocardiography is a painless test that uses sound waves to create images of your heart. It provides your doctor with information about the size and shape of your heart and how well your heart's chambers and valves are working. This procedure takes approximately one hour. There are no restrictions for this procedure.  Your physician recommends that you have a Calcium Score performed.    Follow-Up: At [facility], you and your health needs are our priority.  As part of our continuing mission to provide you with exceptional heart care, we have created designated Provider Care Teams.  These Care Teams include your primary Cardiologist (physician) and Advanced Practice Providers (APPs -  Physician Assistants and Nurse Practitioners) who all work together to provide you with the care you need, when you need it.  We recommend signing up for the patient portal called "MyChart".  Sign up information is provided on this After Visit Summary.  MyChart is used to connect with patients for Virtual Visits (Telemedicine).  Patients are able to view lab/test results, encounter notes, upcoming appointments, etc.  Non-urgent messages can be sent to your provider as well.   To learn more about what you can do with MyChart, go to [email or web address].    Your next appointment:   As needed  The format for your next appointment:   In Person  Provider:   You may see Dr. Verdis PrimeHenry Keilani Terrance or one of the following Advanced Practice Providers on your designated Care Team:    Norma FredricksonLori Gerhardt, NP  Nada BoozerLaura Ingold, NP  Georgie ChardJill McDaniel, NP    Other  Instructions      Signed, Lyn RecordsHenry W Ava Tangney  III, MD  08/11/2019 1:19 PM    [facility]

## 2019-08-11 ENCOUNTER — Encounter: Payer: Self-pay | Admitting: Interventional Cardiology

## 2019-08-11 ENCOUNTER — Ambulatory Visit: Payer: Medicare HMO | Admitting: Interventional Cardiology

## 2019-08-11 ENCOUNTER — Other Ambulatory Visit: Payer: Self-pay

## 2019-08-11 VITALS — BP 132/82 | HR 77 | Ht 63.0 in | Wt 225.6 lb

## 2019-08-11 DIAGNOSIS — R413 Other amnesia: Secondary | ICD-10-CM

## 2019-08-11 DIAGNOSIS — R5383 Other fatigue: Secondary | ICD-10-CM

## 2019-08-11 DIAGNOSIS — R531 Weakness: Secondary | ICD-10-CM | POA: Diagnosis not present

## 2019-08-11 DIAGNOSIS — R600 Localized edema: Secondary | ICD-10-CM | POA: Diagnosis not present

## 2019-08-11 DIAGNOSIS — Z7189 Other specified counseling: Secondary | ICD-10-CM

## 2019-08-11 DIAGNOSIS — R0602 Shortness of breath: Secondary | ICD-10-CM | POA: Diagnosis not present

## 2019-08-11 NOTE — Patient Instructions (Signed)
Medication Instructions:  Your physician recommends that you continue on your current medications as directed. Please refer to the Current Medication list given to you today.  *If you need a refill on your cardiac medications before your next appointment, please call your pharmacy*   Lab Work: Pro BNP, TSH, BMET and Liver today  If you have labs (blood work) drawn today and your tests are completely normal, you will receive your results only by:  MyChart Message (if you have MyChart) OR  A paper copy in the mail If you have any lab test that is abnormal or we need to change your treatment, we will call you to review the results.   Testing/Procedures: Your physician has requested that you have an echocardiogram. Echocardiography is a painless test that uses sound waves to create images of your heart. It provides your doctor with information about the size and shape of your heart and how well your hearts chambers and valves are working. This procedure takes approximately one hour. There are no restrictions for this procedure.  Your physician recommends that you have a Calcium Score performed.    Follow-Up: At [facility], you and your health needs are our priority.  As part of our continuing mission to provide you with exceptional heart care, we have created designated Provider Care Teams.  These Care Teams include your primary Cardiologist (physician) and Advanced Practice Providers (APPs -  Physician Assistants and Nurse Practitioners) who all work together to provide you with the care you need, when you need it.  We recommend signing up for the patient portal called "MyChart".  Sign up information is provided on this After Visit Summary.  MyChart is used to connect with patients for Virtual Visits (Telemedicine).  Patients are able to view lab/test results, encounter notes, upcoming appointments, etc.  Non-urgent messages can be sent to your provider as well.   To learn more about  what you can do with MyChart, go to [email or web address].    Your next appointment:   As needed  The format for your next appointment:   In Person  Provider:   You may see Dr. Verdis Prime or one of the following Advanced Practice Providers on your designated Care Team:    Norma Fredrickson, NP  Nada Boozer, NP  Georgie Chard, NP    Other Instructions

## 2019-08-12 ENCOUNTER — Telehealth: Payer: Self-pay | Admitting: Allergy and Immunology

## 2019-08-12 LAB — HEPATIC FUNCTION PANEL
ALT: 8 IU/L (ref 0–32)
AST: 11 IU/L (ref 0–40)
Albumin: 4.5 g/dL (ref 3.7–4.7)
Alkaline Phosphatase: 106 IU/L (ref 48–121)
Bilirubin Total: 0.3 mg/dL (ref 0.0–1.2)
Bilirubin, Direct: 0.09 mg/dL (ref 0.00–0.40)
Total Protein: 7.3 g/dL (ref 6.0–8.5)

## 2019-08-12 LAB — BASIC METABOLIC PANEL
BUN/Creatinine Ratio: 20 (ref 12–28)
BUN: 18 mg/dL (ref 8–27)
CO2: 24 mmol/L (ref 20–29)
Calcium: 9.8 mg/dL (ref 8.7–10.3)
Chloride: 101 mmol/L (ref 96–106)
Creatinine, Ser: 0.91 mg/dL (ref 0.57–1.00)
GFR calc Af Amer: 70 mL/min/{1.73_m2} (ref >59)
GFR calc non Af Amer: 61 mL/min/{1.73_m2} (ref >59)
Glucose: 93 mg/dL (ref 65–99)
Potassium: 4.6 mmol/L (ref 3.5–5.2)
Sodium: 142 mmol/L (ref 134–144)

## 2019-08-12 LAB — PRO B NATRIURETIC PEPTIDE: NT-Pro BNP: 67 pg/mL (ref 0–738)

## 2019-08-12 LAB — TSH: TSH: 0.905 u[IU]/mL (ref 0.450–4.500)

## 2019-08-12 NOTE — Telephone Encounter (Signed)
Candice please advise.

## 2019-08-12 NOTE — Telephone Encounter (Signed)
Patient says Ins. company needs PA for scratch test and introdermals. Her appt is Monday July 19.

## 2019-08-12 NOTE — Telephone Encounter (Signed)
Fleet Contras if she needs a PA she would have to go through her referring provider. They would need to send it to the insurance to get it approved.

## 2019-08-16 ENCOUNTER — Encounter: Payer: Self-pay | Admitting: Allergy and Immunology

## 2019-08-16 ENCOUNTER — Ambulatory Visit: Payer: Medicare HMO | Admitting: Allergy and Immunology

## 2019-08-16 ENCOUNTER — Other Ambulatory Visit: Payer: Self-pay

## 2019-08-16 VITALS — BP 130/78 | HR 96 | Temp 98.7°F | Resp 20 | Ht 63.0 in | Wt 225.0 lb

## 2019-08-16 DIAGNOSIS — T783XXA Angioneurotic edema, initial encounter: Secondary | ICD-10-CM | POA: Insufficient documentation

## 2019-08-16 DIAGNOSIS — K9049 Malabsorption due to intolerance, not elsewhere classified: Secondary | ICD-10-CM | POA: Diagnosis not present

## 2019-08-16 DIAGNOSIS — T7840XD Allergy, unspecified, subsequent encounter: Secondary | ICD-10-CM | POA: Diagnosis not present

## 2019-08-16 DIAGNOSIS — T783XXD Angioneurotic edema, subsequent encounter: Secondary | ICD-10-CM | POA: Diagnosis not present

## 2019-08-16 DIAGNOSIS — J310 Chronic rhinitis: Secondary | ICD-10-CM | POA: Insufficient documentation

## 2019-08-16 DIAGNOSIS — T7840XA Allergy, unspecified, initial encounter: Secondary | ICD-10-CM | POA: Insufficient documentation

## 2019-08-16 HISTORY — DX: Angioneurotic edema, initial encounter: T78.3XXA

## 2019-08-16 MED ORDER — IPRATROPIUM BROMIDE 0.06 % NA SOLN: 1.0000 | Freq: Three times a day (TID) | NASAL | 5 refills | Status: DC | PRN

## 2019-08-16 NOTE — Assessment & Plan Note (Addendum)
All seasonal and perennial aeroallergen skin tests are negative despite a positive histamine control.    A prescription has been provided for azelastine/fluticasone nasal spray, one spray per nostril 1-2 times daily as needed. Proper nasal spray technique has been discussed and demonstrated.  Nasal saline lavage (NeilMed) has been recommended as needed and prior to medicated nasal sprays along with instructions for proper administration.

## 2019-08-16 NOTE — Telephone Encounter (Signed)
Spoke to pt. She had stated she did call the insurance company and [employer] stated she had to call her referring physician to get a PA for the scratch test and intradermals. I told the pt. When you call the insurance company they usually tell you what your insurance will cover for each of the tests. I told the pt. We usually don't have a problem with medicare and we will just file with her [employer]. Pt. Will be seen today by Dr. Nunzio Cobbs.

## 2019-08-16 NOTE — Patient Instructions (Addendum)
Angioedema Unclear etiology.  Food allergen skin testing was negative today despite a positive histamine control.  The patient is not taking an ACE inhibitor. NSAIDs may exacerbate angioedema but in this case are not the underlying etiology as demonstrated by the fact that the patient has experienced angioedema in the absence of NSAIDs. Will order labs to rule out hereditary angioedema, acquired angioedema, urticaria associated angioedema, and other potential etiologies. For symptom relief, patient is to take oral antihistamines as directed. However, if the underlying pathophysiology is bradykinin mediated, antihistamines will not reduce symptoms. The following labs have been ordered: Tryptase, C4, C1 esterase inhibitor (quantitative and functional), C1q, CBC, CMP, and serum specific IgE again exanthem gum and alpha gal panel. The patient will be notified with further recommendations after lab results have returned.  For now, continue cetirizine daily.  To avoid diminishing benefit with daily use (tachyphylaxis) of second generation antihistamine, consider alternating every few months between fexofenadine (Allegra) and levocetirizine (Xyzal).  Should symptoms recur, a  journal is to be kept recording any foods eaten, beverages consumed, medications taken, activities performed, and environmental conditions within a 6 hour period prior to the onset of symptoms. For any symptoms concerning for anaphylaxis, 911 is to be called immediately.  If labs are unrevealing, follow-up with cardiology and/or nephrology for further evaluation.  Chronic rhinitis All seasonal and perennial aeroallergen skin tests are negative despite a positive histamine control.    A prescription has been provided for azelastine/fluticasone nasal spray, one spray per nostril 1-2 times daily as needed. Proper nasal spray technique has been discussed and demonstrated.  Nasal saline lavage (NeilMed) has been recommended as needed and  prior to medicated nasal sprays along with instructions for proper administration.   When lab results have returned you will be called with further recommendations. With the newly implemented Cures Act, the labs may be visible to you at the same time they become visible to [place]. However, the results will typically not be addressed until all of the results are back, so please be patient.  Until you have heard from [place], please continue the treatment plan as outlined on your take home sheet.

## 2019-08-16 NOTE — Assessment & Plan Note (Addendum)
Unclear etiology.  Food allergen skin testing was negative today despite a positive histamine control.  The patient is not taking an ACE inhibitor. NSAIDs may exacerbate angioedema but in this case are not the underlying etiology as demonstrated by the fact that the patient has experienced angioedema in the absence of NSAIDs. Will order labs to rule out hereditary angioedema, acquired angioedema, urticaria associated angioedema, and other potential etiologies. For symptom relief, patient is to take oral antihistamines as directed. However, if the underlying pathophysiology is bradykinin mediated, antihistamines will not reduce symptoms. The following labs have been ordered: Tryptase, C4, C1 esterase inhibitor (quantitative and functional), C1q, CBC, CMP, and serum specific IgE again exanthem gum and alpha gal panel. The patient will be notified with further recommendations after lab results have returned.  For now, continue cetirizine daily.  To avoid diminishing benefit with daily use (tachyphylaxis) of second generation antihistamine, consider alternating every few months between fexofenadine (Allegra) and levocetirizine (Xyzal).  Should symptoms recur, a  journal is to be kept recording any foods eaten, beverages consumed, medications taken, activities performed, and environmental conditions within a 6 hour period prior to the onset of symptoms. For any symptoms concerning for anaphylaxis, 911 is to be called immediately.  If labs are unrevealing, follow-up with cardiology and/or nephrology for further evaluation.

## 2019-08-16 NOTE — Progress Notes (Signed)
New Patient Note  RE: Marissa Dodson MRN: [id number] DOB: 1940/08/07 Date of Office Visit: 08/16/2019  Referring provider: Gillian Scarce, MD Primary care provider: Gillian Scarce, MD  Chief Complaint: Angioedema and Food Intolerance   History of present illness: Marissa Dodson is a 79 y.o. female seen today in consultation requested by Birdena Jubilee, MD.  She reports that over the past 10 years she has experienced episodes of swelling.  The swelling typically involves the upper arms, shoulders, upper back, hips, and thighs.  She believes that she may have had swelling of the face on 1 or 2 occasions in the past, but is uncertain.  The episodes occur 1-2 times per week on average and the swelling typically takes a few days to several days to resolve.  She denies concomitant urticaria and GI symptoms.  There have been times when she has experienced some shortness of breath.  She is currently being followed by cardiology.  She believes that certain foods, such as sodium containing foods, cheese, and foods with preservatives act as triggers for the swelling.  She states that xanthan gums are "a killer."  She carefully reads labels to avoid preservatives.   Marissa Dodson experiences nasal congestion, rhinorrhea, sneezing, nasal pruritus, ocular pruritus, and occasional sinus pressure.  These symptoms are most frequent and severe with pollen exposure in the springtime and particularly in the fall.  She reports that she had been developing 3 sinus infections per year on average until she started using fluticasone nasal spray and cetirizine daily in 2019.  Currently she averages 1 sinus infection per year.    Assessment and plan: Angioedema Unclear etiology.  Food allergen skin testing was negative today despite a positive histamine control.  The patient is not taking an ACE inhibitor. NSAIDs may exacerbate angioedema but in this case are not the underlying etiology as demonstrated by the fact that the  patient has experienced angioedema in the absence of NSAIDs. Will order labs to rule out hereditary angioedema, acquired angioedema, urticaria associated angioedema, and other potential etiologies. For symptom relief, patient is to take oral antihistamines as directed. However, if the underlying pathophysiology is bradykinin mediated, antihistamines will not reduce symptoms. The following labs have been ordered: Tryptase, C4, C1 esterase inhibitor (quantitative and functional), C1q, CBC, CMP, and serum specific IgE again exanthem gum and alpha gal panel. The patient will be notified with further recommendations after lab results have returned.  For now, continue cetirizine daily.  To avoid diminishing benefit with daily use (tachyphylaxis) of second generation antihistamine, consider alternating every few months between fexofenadine (Allegra) and levocetirizine (Xyzal).  Should symptoms recur, a  journal is to be kept recording any foods eaten, beverages consumed, medications taken, activities performed, and environmental conditions within a 6 hour period prior to the onset of symptoms. For any symptoms concerning for anaphylaxis, 911 is to be called immediately.  If labs are unrevealing, follow-up with cardiology and/or nephrology for further evaluation.  Chronic rhinitis All seasonal and perennial aeroallergen skin tests are negative despite a positive histamine control.    A prescription has been provided for azelastine/fluticasone nasal spray, one spray per nostril 1-2 times daily as needed. Proper nasal spray technique has been discussed and demonstrated.  Nasal saline lavage (NeilMed) has been recommended as needed and prior to medicated nasal sprays along with instructions for proper administration.   Meds ordered this encounter  Medications  . ipratropium (ATROVENT) 0.06 % nasal spray    Sig: Place 1-2  sprays into both nostrils 3 (three) times daily between meals as needed for  rhinitis.    Dispense:  15 mL    Refill:  5    Diagnostics: Environmental skin testing: Negative despite a positive histamine control. Food allergen skin testing: Negative despite a positive histamine control.    Physical examination: Blood pressure 130/78, pulse 96, temperature 98.7 F (37.1 C), temperature source Oral, resp. rate 20, height 5\' 3"  (1.6 m), weight 225 lb (102.1 kg), SpO2 98 %.  General: Alert, interactive, in no acute distress. HEENT: TMs pearly gray, turbinates moderately edematous without discharge, post-pharynx mildly erythematous. Neck: Supple without lymphadenopathy. Lungs: Clear to auscultation without wheezing, rhonchi or rales. CV: Normal S1, S2 without murmurs. Abdomen: Nondistended, nontender. Skin: Warm and dry, without lesions or rashes. Extremities:  No clubbing, cyanosis or edema. Neuro:   Grossly intact.  Review of systems:  Review of systems negative except as noted in HPI / PMHx or noted below: Review of Systems  Constitutional: Negative.   HENT: Negative.   Eyes: Negative.   Respiratory: Negative.   Cardiovascular: Negative.   Gastrointestinal: Negative.   Genitourinary: Negative.   Musculoskeletal: Negative.   Skin: Negative.   Neurological: Negative.   Endo/Heme/Allergies: Negative.   Psychiatric/Behavioral: Negative.     Past medical history:  Past Medical History:  Diagnosis Date  . Acute meniscal injury of left knee   . Complication of anesthesia    hypotention  . Constipation   . GERD (gastroesophageal reflux disease)   . Hypertension   . Hypoglycemia, unspecified   . IC (interstitial cystitis)   . Memory difficulties 07/18/2016  . Sleep apnea    STOP-Bang =  5  . Swelling of left knee joint     Past surgical history:  Past Surgical History:  Procedure Laterality Date  . HEMORROIDECTOMY  1970's  . KNEE ARTHROSCOPY Right 2009  . KNEE ARTHROSCOPY Left 10/09/2012   Procedure: LEFT ARTHROSCOPY KNEE WITH medial meiscal  DEBRIDEMENT;  Surgeon: 12/09/2012, MD;  Location: [facility];  Service: Orthopedics;  Laterality: Left;  . LAPAROSCOPIC LYSIS OF ADHESIONS  1980'S  . TONSILLECTOMY AND ADENOIDECTOMY  AGE 36  . TOTAL ABDOMINAL HYSTERECTOMY W/ BILATERAL SALPINGOOPHORECTOMY  AGE 56    Family history: Family History  Problem Relation Age of Onset  . Heart disease Mother   . Alzheimer's disease Mother   . Prostate cancer Father   . Diabetes Sister   . Diabetes Brother   . Liver disease Brother   . Asthma Grandson   . Allergic rhinitis Grandson   . Eczema Grandson   . Allergic rhinitis Daughter   . Allergic rhinitis Daughter   . Allergic rhinitis Son   . Food Allergy Granddaughter   . Immunodeficiency Neg Hx   . Urticaria Neg Hx   . Angioedema Neg Hx   . Atopy Neg Hx     Social history: Social History   Socioeconomic History  . Marital status: Legally Separated    Spouse name: Not on file  . Number of children: 3  . Years of education: 41  . Highest education level: Not on file  Occupational History  . Occupation: Retired  Tobacco Use  . Smoking status: Former Smoker    Packs/day: 0.50    Years: 20.00    Pack years: 10.00    Types: Cigarettes    Quit date: 10/02/1992    Years since quitting: 26.8  . Smokeless tobacco: Never Used  Vaping Use  .  Vaping Use: Never used  Substance and Sexual Activity  . Alcohol use: No  . Drug use: No  . Sexual activity: Not on file  Other Topics Concern  . Not on file  Social History Narrative   Lives alone   Caffeine use: Drinks water and coffee daily   Soda rare   Right handed    Social Determinants of Health   Financial Resource Strain:   . Difficulty of Paying Living Expenses:   Food Insecurity:   . Worried About Programme researcher, broadcasting/film/videounning Out of Food in the Last Year:   . Baristaan Out of Food in the Last Year:   Transportation Needs:   . Freight forwarderLack of Transportation (Medical):   Marland Kitchen. Lack of Transportation (Non-Medical):   Physical Activity:   .  Days of Exercise per Week:   . Minutes of Exercise per Session:   Stress:   . Feeling of Stress :   Social Connections:   . Frequency of Communication with Friends and Family:   . Frequency of Social Gatherings with Friends and Family:   . Attends Religious Services:   . Active Member of Clubs or Organizations:   . Attends BankerClub or Organization Meetings:   Marland Kitchen. Marital Status:   Intimate Partner Violence:   . Fear of Current or Ex-Partner:   . Emotionally Abused:   Marland Kitchen. Physically Abused:   . Sexually Abused:     Environmental History: The patient lives in a 78 year old apartment with carpeting throughout and central air/heat.  There is mold/water damage in the home.  There are no pets in the home.  She is a former cigarette smoker having smoked half pack per day on average between 1976 and 1999.  Current Outpatient Medications  Medication Sig Dispense Refill  . albuterol (VENTOLIN HFA) 108 (90 Base) MCG/ACT inhaler Inhale into the lungs every 6 (six) hours as needed for wheezing or shortness of breath.    Marland Kitchen. amitriptyline (ELAVIL) 25 MG tablet Take 50 mg by mouth at bedtime.    Marland Kitchen. aspirin 81 MG chewable tablet Chew 81 mg by mouth daily.    Marland Kitchen. atenolol (TENORMIN) 25 MG tablet Take 25 mg by mouth every morning.    . cetirizine (ZYRTEC) 10 MG tablet Take 10 mg by mouth daily.    . Cholecalciferol (VITAMIN D3) 2000 UNITS TABS Take 1 capsule by mouth daily.    . fluticasone (FLONASE) 50 MCG/ACT nasal spray     . furosemide (LASIX) 40 MG tablet Take 20-40 mg by mouth daily. Takes 20mg  daily if increased fluid retention takes 40mg     . ibuprofen (ADVIL,MOTRIN) 200 MG tablet Take 600 mg by mouth once a week. And prn     . omeprazole (PRILOSEC) 20 MG capsule Take 20 mg by mouth 2 (two) times daily before a meal.     . potassium chloride SA (K-DUR,KLOR-CON) 20 MEQ tablet Take 20 mEq by mouth daily. Takes 10meq daily and if taking 40mg  lasix will take 20meq     . traMADol (ULTRAM) 50 MG tablet Take 50  mg by mouth every 6 (six) hours as needed.    Marland Kitchen. ipratropium (ATROVENT) 0.06 % nasal spray Place 1-2 sprays into both nostrils 3 (three) times daily between meals as needed for rhinitis. 15 mL 5   No current facility-administered medications for this visit.    Known medication allergies: Allergies  Allergen Reactions  . Ketek [Telithromycin] Shortness Of Breath and Other (See Comments)  . Metoprolol Other (See Comments)    "  Lopressor" Hands hurt  . Cephalosporins Swelling and Other (See Comments)    Urinary retention  . Codeine Itching  . Durezol [Difluprednate]     Redness in eyes  . Flagyl [Metronidazole] Swelling  . Levofloxacin     Headache, edema  . Meloxicam     Edema, low urine output  . Vistaril [Hydroxyzine]     Short term memory loss    I appreciate the opportunity to take part in Jordanne's care. Please do not hesitate to contact me with questions.  Sincerely,   R. Jorene Guest, MD

## 2019-08-18 ENCOUNTER — Telehealth: Payer: Self-pay | Admitting: Allergy and Immunology

## 2019-08-18 ENCOUNTER — Telehealth: Payer: Self-pay | Admitting: Interventional Cardiology

## 2019-08-18 NOTE — Telephone Encounter (Signed)
Left a detailed message per Dr Nunzio Cobbs that the [facility] was canceled.

## 2019-08-18 NOTE — Telephone Encounter (Signed)
Patient states Cardiologist read her lab results and all were good they told her to have the cmp lab work canceled that was ordered for her.

## 2019-08-18 NOTE — Telephone Encounter (Signed)
Pt is looking to speak with a nurse. Returning a phone call. Please call back

## 2019-08-18 NOTE — Addendum Note (Signed)
Addended by: Maryjean Morn D on: 08/18/2019 03:45 PM   Modules accepted: Orders

## 2019-08-18 NOTE — Telephone Encounter (Signed)
Informed pt of results. Pt verbalized understanding. 

## 2019-08-31 ENCOUNTER — Ambulatory Visit (INDEPENDENT_AMBULATORY_CARE_PROVIDER_SITE_OTHER)
Admission: RE | Admit: 2019-08-31 | Discharge: 2019-08-31 | Disposition: A | Discharge status: Home / Self Care | Payer: Self-pay | Source: Ambulatory Visit | Attending: Interventional Cardiology | Admitting: Interventional Cardiology

## 2019-08-31 ENCOUNTER — Other Ambulatory Visit (HOSPITAL_COMMUNITY): Payer: Medicare HMO

## 2019-08-31 ENCOUNTER — Ambulatory Visit (HOSPITAL_COMMUNITY): Payer: Medicare HMO | Attending: Cardiology

## 2019-08-31 ENCOUNTER — Other Ambulatory Visit: Payer: Self-pay

## 2019-08-31 DIAGNOSIS — R0602 Shortness of breath: Secondary | ICD-10-CM | POA: Diagnosis present

## 2019-08-31 LAB — ECHOCARDIOGRAM COMPLETE
Area-P 1/2: 3.05 cm2
S' Lateral: 2.4 cm

## 2019-09-01 ENCOUNTER — Telehealth: Payer: Self-pay | Admitting: *Deleted

## 2019-09-01 DIAGNOSIS — E7849 Other hyperlipidemia: Secondary | ICD-10-CM

## 2019-09-01 NOTE — Telephone Encounter (Signed)
-----   Message from Lyn Records, MD sent at 09/01/2019  1:04 PM EDT ----- Regarding: FW: The echo shows strong heart and no surprises. Valves okay. ----- Message ----- From: Interface, [employer] One Seven Sent: 08/31/2019   3:56 PM EDT To: Lyn Records, MD

## 2019-09-01 NOTE — Telephone Encounter (Signed)
Lyn Records, MD  Dossie Arbour, RN Calcium score was delayed. It is elevated, mild to moderate severity. Management is LDL <70. Will have to review labs to determine what needs to be done.       Lyn Records, MD  Dossie Arbour, RN Needs a lipid panel done, fasting

## 2019-09-01 NOTE — Telephone Encounter (Signed)
I spoke with patient and reviewed results with her.  She reports lab work was checked by PCP earlier this year.   Lab results are in care everywhere.  Done through [facility] on 05/10/19.   Will check with Dr Katrinka Blazing to see if OK to use this lab work or if patient needs updated lipid profile.

## 2019-09-05 NOTE — Telephone Encounter (Signed)
Total cholesterol 225 with LDL 132. Start Crestor 20 mg daily. This is necessary due to elevated calcium score. Repeat Liver and Lipid in 6-8 weeks.

## 2019-09-07 NOTE — Telephone Encounter (Signed)
Reviewed recommendations with pt.  She would like to start off at Crestor 10mg  if Dr. is agreeable.  States she has tried Zocor and Niacin in the past and had issues.  She currently has issues with her knees and walking, so she is hesitant to start out at 20mg .  Advised I will send to Dr. Katrinka Blazing to see if he would be ok with starting at 10mg  and then increasing if blood work shows it is warranted? Pt appreciative for call.

## 2019-09-07 NOTE — Telephone Encounter (Signed)
Okay to start at 10 mg daily.

## 2019-09-08 MED ORDER — ROSUVASTATIN CALCIUM 10 MG PO TABS
10.0000 mg | ORAL_TABLET | Freq: Every day | ORAL | 3 refills | Status: DC
Start: 2019-09-08 — End: 2019-12-09

## 2019-09-08 NOTE — Telephone Encounter (Signed)
Spoke with pt and made her aware ok to start at 10mg  of Rosuvastatin.  Scheduled labs for 9/22.  Pt aware to come fasting.

## 2019-10-20 ENCOUNTER — Other Ambulatory Visit: Payer: Medicare HMO

## 2019-11-11 ENCOUNTER — Other Ambulatory Visit: Payer: Medicare HMO

## 2019-11-12 ENCOUNTER — Other Ambulatory Visit: Payer: Self-pay

## 2019-11-12 ENCOUNTER — Other Ambulatory Visit: Payer: Medicare HMO | Admitting: *Deleted

## 2019-11-12 DIAGNOSIS — E7849 Other hyperlipidemia: Secondary | ICD-10-CM

## 2019-11-12 LAB — HEPATIC FUNCTION PANEL
ALT: 10 IU/L (ref 0–32)
AST: 11 IU/L (ref 0–40)
Albumin: 4.6 g/dL (ref 3.7–4.7)
Alkaline Phosphatase: 98 IU/L (ref 44–121)
Bilirubin Total: 0.4 mg/dL (ref 0.0–1.2)
Bilirubin, Direct: 0.12 mg/dL (ref 0.00–0.40)
Total Protein: 6.9 g/dL (ref 6.0–8.5)

## 2019-11-12 LAB — LIPID PANEL
Chol/HDL Ratio: 2.3 ratio (ref 0.0–4.4)
Cholesterol, Total: 165 mg/dL (ref 100–199)
HDL: 72 mg/dL (ref >39)
LDL Chol Calc (NIH): 77 mg/dL (ref 0–99)
Triglycerides: 84 mg/dL (ref 0–149)
VLDL Cholesterol Cal: 16 mg/dL (ref 5–40)

## 2019-12-09 ENCOUNTER — Other Ambulatory Visit: Payer: Self-pay

## 2019-12-09 MED ORDER — ROSUVASTATIN CALCIUM 10 MG PO TABS
10.0000 mg | ORAL_TABLET | Freq: Every day | ORAL | 2 refills | Status: DC
Start: 2019-12-09 — End: 2019-12-14

## 2019-12-10 ENCOUNTER — Telehealth: Payer: Self-pay | Admitting: Interventional Cardiology

## 2019-12-10 NOTE — Telephone Encounter (Signed)
Left a message for the patient to call the office back regarding her medication concerns.

## 2019-12-10 NOTE — Telephone Encounter (Signed)
Pt c/o medication issue:  1. Name of Medication: rosuvastatin (CRESTOR) 10 MG tablet  2. How are you currently taking this medication (dosage and times per day)? Pt takes 10  Mg daily   3. Are you having a reaction (difficulty breathing--STAT)? yes  4. What is your medication issue? Pt said she is having numbness in her hands. She is also waking up in the middle of the night with pain and it keeps her from sleeping.  Pt is not sure why it waited so long to kick in.  She does not want to take this medicine anymore

## 2019-12-13 NOTE — Telephone Encounter (Signed)
Spoke with the pt and she reports that she has had numbness and tingling in her extremities and she cannot sleep at night and just does not generally feel well since taking the Crestor... she has stopped it for 3 days and she has had much improvement and she does not want to go back on it.. I advised her to stay off of it and to continue to monitor her symptoms.   I will forward to Dr. Katrinka Blazing for further recommendations.

## 2019-12-14 NOTE — Telephone Encounter (Signed)
Not sure this is medication related. Stop for 2 weeks, and if symptoms resolve but return when medication resumed, let me know.

## 2019-12-14 NOTE — Telephone Encounter (Signed)
Spoke with pt and made her aware of recommendations.  Pt refuses to restart the medication after a 2 week wash out.  States she is sure it is the medication because her symptoms are so much better after being off of it for a few days now.  Advised I will send to Dr. Katrinka Blazing to see about alternative medication.

## 2019-12-14 NOTE — Telephone Encounter (Signed)
Okay 

## 2019-12-14 NOTE — Telephone Encounter (Signed)
Spoke with Marissa Dodson and told her I made Dr. Katrinka Blazing aware that she did not want to restart Rosuvastatin and he said ok.  Advised to watch diet and exercise to help keep cholesterol numbers down.  Marissa Dodson agreeable.

## 2020-09-08 ENCOUNTER — Telehealth: Payer: Self-pay | Admitting: *Deleted

## 2020-09-08 NOTE — Telephone Encounter (Signed)
   [facility] Pre-operative Risk Assessment    Patient Name: Marissa Dodson  DOB: 04-11-1940 MRN: [id number]  HEARTCARE STAFF:  - IMPORTANT!!!!!! Under Visit Info/Reason for Call, type in Other and utilize the format Clearance MM/DD/YY or Clearance TBD. Do not use dashes or single digits. - Please review there is not already an duplicate clearance open for this procedure. - If request is for dental extraction, please clarify the # of teeth to be extracted. - If the patient is currently at the dentist's office, call Pre-Op Callback Staff (MA/nurse) to input urgent request.  - If the patient is not currently in the dentist office, please route to the Pre-Op pool.  Request for surgical clearance:  What type of surgery is being performed? RIGHT TOTAL HIP ARTHROPLASTY  When is this surgery scheduled? 10/17/20  What type of clearance is required (medical clearance vs. Pharmacy clearance to hold med vs. Both)? MEDICAL   Are there any medications that need to be held prior to surgery and how long? ASA  Practice name and name of physician performing surgery? [facility]; DR. Paralee Cancel  What is the office phone number? [phone]   7.   What is the office fax number? [email or web address]  8.   Anesthesia type (None, local, MAC, general) ? SPINAL   Julaine Hua 09/08/2020, 9:19 AM  _________________________________________________________________   (provider comments below)

## 2020-09-08 NOTE — Telephone Encounter (Signed)
Pt is agreeable to pre op appt needed. Pt has been scheduled to see Tereso Newcomer, [facility] 10/03/20 @ 2:15. Pt is grateful for the appt and the call. I will forward clearance notes to [facility] for appt. I will send FYI to surgeon's office pt has appt 10/03/20.

## 2020-09-08 NOTE — Telephone Encounter (Signed)
   Name: Marissa Dodson  DOB: 28-Aug-1940  MRN: [id number]  Primary Cardiologist: None  Chart reviewed as part of pre-operative protocol coverage. Because of [name]'s past medical history and time since last visit, she will require a follow-up visit in order to better assess preoperative cardiovascular risk.  Pre-op covering staff: - Please schedule appointment and call patient to inform them. Please add "pre-op clearance" to the appointment notes so provider is aware. - Please contact requesting surgeon's office via preferred method (i.e, phone, fax) to inform them of need for appointment prior to surgery.  Would be reasonable to hold aspirin prior to her upcoming procedure if needed given lack of known CAD, PAD, or CVA.   Beatriz Stallion, PA-C  09/08/2020, 12:06 PM

## 2020-09-28 NOTE — Patient Instructions (Addendum)
DUE TO COVID-19 ONLY ONE VISITOR IS ALLOWED TO COME WITH YOU AND STAY IN THE WAITING ROOM ONLY DURING PRE OP AND PROCEDURE DAY OF SURGERY IF YOU ARE GOING HOME AFTER SURGERY. IF YOU ARE SPENDING THE NIGHT 2 PEOPLE MAY VISIT WITH YOU IN YOUR PRIVATE ROOM AFTER SURGERY UNTIL VISITING  HOURS ARE OVER AT 800 PM AND THE 2 VISITORS CANNOT SPEND THE NIGHT.   YOU NEED TO HAVE A COVID 19 TEST ON__9/16__THIS TEST MUST BE DONE BEFORE SURGERY,  COVID TESTING SITE  IS LOCATED AT [address], [place]. REMAIN IN YOUR CAR THIS IS A DRIVE UP TEST. AFTER YOUR COVID TEST PLEASE WEAR A MASK OUT IN PUBLIC AND SOCIAL DISTANCE AND WASH YOUR HANDS FREQUENTLY, ALSO ASK ALL YOUR CLOSE CONTACT PERSONS TO WEAR A MASK AND SOCIAL DISTANCE AND WASH THEIR HANDS FREQUENTLY ALSO.               Marissa Dodson     Your procedure is scheduled on: 10/17/20   Report to [facility] Main  Entrance   Report to shorts stay at 5:15 AM     Call this number if you have problems the morning of surgery [phone]    No food after midnight.    You may have clear liquid until 4:15 AM.    At 4:00 AM drink pre surgery drink.   Nothing by mouth after 4:15 AM.   CLEAR LIQUID DIET        BRUSH YOUR TEETH MORNING OF SURGERY AND RINSE YOUR MOUTH OUT, NO CHEWING GUM CANDY OR MINTS.     Take these medicines the morning of surgery with A SIP OF WATER: Atenolol, Omeprazole, use your inhaler and bring it with you.                                  You may not have any metal on your body including hair pins and              piercings  Do not wear jewelry, make-up, lotions, powders or perfumes, deodorant             Do not wear nail polish on your fingernails or toes.  Do not shave  48 hours prior to surgery.               Do not bring valuables to the hospital. [place] IS NOT             RESPONSIBLE   FOR VALUABLES.  Contacts, dentures or bridgework may not be worn into surgery.                   Please read over the following fact sheets you were given: _____________________________________________________________________             [facility] - Preparing for Surgery Before surgery, you can play an important role.  Because skin is not sterile, your skin needs to be as free of germs as possible.  You can reduce the number of germs on your skin by washing with CHG (chlorahexidine gluconate) soap before surgery.  CHG is an antiseptic cleaner which kills germs and bonds with the skin to continue killing germs even after washing. Please DO NOT use if you have an allergy to CHG or antibacterial soaps.  If your skin becomes reddened/irritated stop using the CHG and inform your nurse when  you arrive at Short Stay. Do not shave (including legs and underarms) for at least 48 hours prior to the first CHG shower.    Please follow these instructions carefully:  1.  Shower with CHG Soap the night before surgery and the  morning of Surgery.  2.  If you choose to wash your hair, wash your hair first as usual with your  normal  shampoo.  3.  After you shampoo, rinse your hair and body thoroughly to remove the  shampoo.                            4.  Use CHG as you would any other liquid soap.  You can apply chg directly  to the skin and wash                       Gently with a scrungie or clean washcloth.  5.  Apply the CHG Soap to your body ONLY FROM THE NECK DOWN.   Do not use on face/ open                           Wound or open sores. Avoid contact with eyes, ears mouth and genitals (private parts).                       Wash face,  Genitals (private parts) with your normal soap.             6.  Wash thoroughly, paying special attention to the area where your surgery  will be performed.  7.  Thoroughly rinse your body with warm water from the neck down.  8.  DO NOT shower/wash with your normal soap after using and rinsing off  the CHG Soap.             9.  Pat yourself dry with a clean towel.             10.  Wear clean pajamas.            11.  Place clean sheets on your bed the night of your first shower and do not  sleep with pets. Day of Surgery : Do not apply any lotions/deodorants the morning of surgery.  Please wear clean clothes to the hospital/surgery center.  FAILURE TO FOLLOW THESE INSTRUCTIONS MAY RESULT IN THE CANCELLATION OF YOUR SURGERY PATIENT SIGNATURE_________________________________  NURSE SIGNATURE__________________________________  ________________________________________________________________________   Marissa Dodson  An incentive spirometer is a tool that can help keep your lungs clear and active. This tool measures how well you are filling your lungs with each breath. Taking long deep breaths may help reverse or decrease the chance of developing breathing (pulmonary) problems (especially infection) following: A long period of time when you are unable to move or be active. BEFORE THE PROCEDURE  If the spirometer includes an indicator to show your best effort, your nurse or respiratory therapist will set it to a desired goal. If possible, sit up straight or lean slightly forward. Try not to slouch. Hold the incentive spirometer in an upright position. INSTRUCTIONS FOR USE  Sit on the edge of your bed if possible, or sit up as far as you can in bed or on a chair. Hold the incentive spirometer in an upright position. Breathe out normally. Place the mouthpiece in your mouth and seal your lips tightly around  it. Breathe in slowly and as deeply as possible, raising the piston or the ball toward the top of the column. Hold your breath for 3-5 seconds or for as long as possible. Allow the piston or ball to fall to the bottom of the column. Remove the mouthpiece from your mouth and breathe out normally. Rest for a few seconds and repeat Steps 1 through 7 at least 10 times every 1-2 hours when you are awake. Take your time and take a few normal breaths between  deep breaths. The spirometer may include an indicator to show your best effort. Use the indicator as a goal to work toward during each repetition. After each set of 10 deep breaths, practice coughing to be sure your lungs are clear. If you have an incision (the cut made at the time of surgery), support your incision when coughing by placing a pillow or rolled up towels firmly against it. Once you are able to get out of bed, walk around indoors and cough well. You may stop using the incentive spirometer when instructed by your caregiver.  RISKS AND COMPLICATIONS Take your time so you do not get dizzy or light-headed. If you are in pain, you may need to take or ask for pain medication before doing incentive spirometry. It is harder to take a deep breath if you are having pain. AFTER USE Rest and breathe slowly and easily. It can be helpful to keep track of a log of your progress. Your caregiver can provide you with a simple table to help with this. If you are using the spirometer at home, follow these instructions: SEEK MEDICAL CARE IF:  You are having difficultly using the spirometer. You have trouble using the spirometer as often as instructed. Your pain medication is not giving enough relief while using the spirometer. You develop fever of 100.5 F (38.1 C) or higher. SEEK IMMEDIATE MEDICAL CARE IF:  You cough up bloody sputum that had not been present before. You develop fever of 102 F (38.9 C) or greater. You develop worsening pain at or near the incision site. MAKE SURE YOU:  Understand these instructions. Will watch your condition. Will get help right away if you are not doing well or get worse. Document Released: 05/27/2006 Document Revised: 04/08/2011 Document Reviewed: 07/28/2006 [facility] Patient Information 2014 [place], [place].   ________________________________________________________________________

## 2020-10-03 ENCOUNTER — Ambulatory Visit: Payer: Medicare HMO | Admitting: Physician Assistant

## 2020-10-03 ENCOUNTER — Other Ambulatory Visit: Payer: Self-pay

## 2020-10-03 ENCOUNTER — Encounter: Payer: Self-pay | Admitting: Physician Assistant

## 2020-10-03 VITALS — BP 128/70 | HR 75 | Ht 63.0 in | Wt 224.8 lb

## 2020-10-03 DIAGNOSIS — E785 Hyperlipidemia, unspecified: Secondary | ICD-10-CM | POA: Diagnosis not present

## 2020-10-03 DIAGNOSIS — Z01810 Encounter for preprocedural cardiovascular examination: Secondary | ICD-10-CM | POA: Diagnosis not present

## 2020-10-03 DIAGNOSIS — I10 Essential (primary) hypertension: Secondary | ICD-10-CM | POA: Diagnosis not present

## 2020-10-03 DIAGNOSIS — I2510 Atherosclerotic heart disease of native coronary artery without angina pectoris: Secondary | ICD-10-CM

## 2020-10-03 NOTE — Patient Instructions (Signed)
Medication Instructions:   Your physician recommends that you continue on your current medications as directed. Please refer to the Current Medication list given to you today.  *If you need a refill on your cardiac medications before your next appointment, please call your pharmacy*   Lab Work:  -None  If you have labs (blood work) drawn today and your tests are completely normal, you will receive your results only by: MyChart Message (if you have MyChart) OR A paper copy in the mail If you have any lab test that is abnormal or we need to change your treatment, we will call you to review the results.   Testing/Procedures:  -None  Follow-Up: At [facility], you and your health needs are our priority.  As part of our continuing mission to provide you with exceptional heart care, we have created designated Provider Care Teams.  These Care Teams include your primary Cardiologist (physician) and Advanced Practice Providers (APPs -  Physician Assistants and Nurse Practitioners) who all work together to provide you with the care you need, when you need it.  We recommend signing up for the patient portal called "MyChart".  Sign up information is provided on this After Visit Summary.  MyChart is used to connect with patients for Virtual Visits (Telemedicine).  Patients are able to view lab/test results, encounter notes, upcoming appointments, etc.  Non-urgent messages can be sent to your provider as well.   To learn more about what you can do with MyChart, go to [email or web address].    Your next appointment:   1 year(s)  The format for your next appointment:   In Person  Provider:   Verdis Prime, MD   Other Instructions  It is ok to hold Asprin 7-10 days before your surgery.

## 2020-10-03 NOTE — Telephone Encounter (Signed)
Notes faxed to surgeon. Tereso Newcomer, PA-C    10/03/2020 3:12 PM

## 2020-10-03 NOTE — Progress Notes (Signed)
Cardiology Office Note:    Date:  10/03/2020   ID:  Marissa Dodson, DOB December 28, 1940, MRN [id number]  PCP:  Elinor Dodge, MD   [facility] Providers Cardiologist:  Lesleigh Noe, MD     Referring MD: Gillian Scarce, MD   Chief Complaint:  Surgical clearance    Patient Profile:    Marissa Dodson is a 80 y.o. female with:  Coronary artery calcification Hypertension Hyperlipidemia  Intol of Rosuvastatin  GERD OSA   Prior CV studies:  CAC SCORE 08/31/19 Coronary calcium score 245 (79th percentile) Aortic atherosclerosis   ECHOCARDIOGRAM 08/31/19  1. Left ventricular ejection fraction, by estimation, is 60 to 65%. The  left ventricle has normal function. The left ventricle has no regional  wall motion abnormalities. Left ventricular diastolic parameters are  consistent with Grade I diastolic  dysfunction (impaired relaxation).   2. Right ventricular systolic function is normal. The right ventricular  size is normal. Tricuspid regurgitation signal is inadequate for assessing  PA pressure.   3. The mitral valve is normal in structure. No evidence of mitral valve  regurgitation. No evidence of mitral stenosis.   4. The aortic valve has an indeterminant number of cusps. Aortic valve  regurgitation is not visualized. No aortic stenosis is present.   5. The inferior vena cava is normal in size with greater than 50%  respiratory variability, suggesting right atrial pressure of 3 mmHg.    History of Present Illness: [name] was evaluated by Dr. Katrinka Blazing in 7/21 for shortness of breath.  Echocardiogram demonstrated EF 60-65 and mild diastolic dysfunction.  NT proBNP was normal.  Coronary calcium score was elevated at 245.  Recommendation was to start rosuvastatin.  However, the patient stopped this secondary to side effects.  She returns for surgical clearance.  She needs right total hip replacement with Dr. Charlann Boxer on 10/17/2020 under spinal anesthesia.  She is  here alone.  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  She lives alone.  She has not had exertional chest pain or shortness of breath.  She has not had syncope.  She does have some edema in her right leg that seems to be worse the longer she is on her feet.  This seems to be related to her arthritis.  She likes to sleep on an incline and has done so for many years.        Past Medical History:  Diagnosis Date   Acute meniscal injury of left knee    Complication of anesthesia    hypotention   Constipation    GERD (gastroesophageal reflux disease)    Hypertension    Hypoglycemia, unspecified    IC (interstitial cystitis)    Memory difficulties 07/18/2016   Sleep apnea    STOP-Bang =  5   Swelling of left knee joint     Current Medications: Current Meds  Medication Sig   acetaminophen (TYLENOL) 500 MG tablet Take 1,000 mg by mouth every 6 (six) hours as needed for headache or moderate pain.   albuterol (VENTOLIN HFA) 108 (90 Base) MCG/ACT inhaler Inhale 2 puffs into the lungs every 6 (six) hours as needed for wheezing or shortness of breath.   amitriptyline (ELAVIL) 50 MG tablet Take 50 mg by mouth at bedtime.   aspirin 81 MG chewable tablet Chew 81 mg by mouth daily.   atenolol (TENORMIN) 50 MG tablet Take 50 mg by mouth daily.   Carboxymethylcellul-Glycerin (LUBRICATING  EYE DROPS OP) Place 1 drop into both eyes daily as needed (dry eyes).   cetirizine (ZYRTEC) 10 MG tablet Take 10 mg by mouth daily.   Cholecalciferol (VITAMIN D3) 75 MCG (3000 UT) TABS Take 3,000 Units by mouth daily.   fluticasone (FLONASE) 50 MCG/ACT nasal spray Place 2 sprays into both nostrils at bedtime.   furosemide (LASIX) 40 MG tablet Take 20 mg by mouth See admin instructions. Take 20 mg daily, may take a second 20 mg dose as needed for swelling   ibuprofen (ADVIL) 600 MG tablet Take 600 mg by mouth every 6 (six) hours as needed for moderate pain.   omeprazole (PRILOSEC) 20 MG capsule  Take 20 mg by mouth 2 (two) times daily.   polyethylene glycol powder (GLYCOLAX/MIRALAX) 17 GM/SCOOP powder Take 17 g by mouth daily.   potassium chloride SA (K-DUR,KLOR-CON) 20 MEQ tablet Take 20 mEq by mouth daily.     Allergies:   Ketek [telithromycin], Metoprolol, Cephalosporins, Codeine, Durezol [difluprednate], Flagyl [metronidazole], Levofloxacin, Maxzide [hydrochlorothiazide w-triamterene], Meloxicam, Prednisone, and Vistaril [hydroxyzine]   Social History   Tobacco Use   Smoking status: Former    Packs/day: 0.50    Years: 20.00    Pack years: 10.00    Types: Cigarettes    Quit date: 10/02/1992    Years since quitting: 28.0   Smokeless tobacco: Never  Vaping Use   Vaping Use: Never used  Substance Use Topics   Alcohol use: No   Drug use: No     Family Hx: The patient's family history includes Allergic rhinitis in her daughter, daughter, grandson, and son; Alzheimer's disease in her mother; Asthma in her grandson; Diabetes in her brother and sister; Eczema in her grandson; Food Allergy in her granddaughter; Heart disease in her mother; Liver disease in her brother; Prostate cancer in her father. There is no history of Immunodeficiency, Urticaria, Angioedema, or Atopy.  Review of Systems  Gastrointestinal:  Negative for hematochezia.  Genitourinary:  Negative for hematuria.    EKGs/Labs/Other Test Reviewed:    EKG:  EKG is  ordered today.  The ekg ordered today demonstrates NSR, HR 75, leftward axis, no ST-T wave changes, QTC 422  Recent Labs: 11/12/2019: ALT 10   Recent Lipid Panel Lab Results  Component Value Date/Time   CHOL 165 11/12/2019 10:07 AM   TRIG 84 11/12/2019 10:07 AM   HDL 72 11/12/2019 10:07 AM   LDLCALC 77 11/12/2019 10:07 AM      Risk Assessment/Calculations:          Physical Exam:    VS:  BP 128/70   Pulse 75   Ht 5\' 3"  (1.6 m)   Wt 224 lb 12.8 oz (102 kg)   SpO2 98%   BMI 39.82 kg/m     Wt Readings from Last 3 Encounters:   10/03/20 224 lb 12.8 oz (102 kg)  08/16/19 225 lb (102.1 kg)  08/11/19 225 lb 9.6 oz (102.3 kg)     Constitutional:      Appearance: Healthy appearance. Not in distress.  Pulmonary:     Effort: Pulmonary effort is normal.     Breath sounds: No wheezing. No rales.  Cardiovascular:     Normal rate. Regular rhythm. Normal S1. Normal S2.      Murmurs: There is no murmur.  Edema:    Peripheral edema absent.  Abdominal:     Palpations: Abdomen is soft.  Musculoskeletal:     Cervical back: Neck supple. Skin:  General: Skin is warm and dry.  Neurological:     Mental Status: Alert and oriented to person, place and time.     Cranial Nerves: Cranial nerves are intact.        ASSESSMENT & PLAN:    1. Preoperative cardiovascular examination   Ms. Mottola's perioperative risk of a major cardiac event is 0.4% according to the Revised Cardiac Risk Index (RCRI).  Therefore, she is at low risk for perioperative complications.   Her functional capacity is fair at 4.3 METs according to the Duke Activity Status Index (DASI). Recommendations: According to ACC/AHA guidelines, no further cardiovascular testing needed.  The patient may proceed to surgery at acceptable risk.   Antiplatelet and/or Anticoagulation Recommendations: Aspirin can be held for 7-10 days prior to her surgery.  Please resume Aspirin post operatively when it is felt to be safe from a bleeding standpoint.   2. Coronary artery calcification seen on CT scan CAC score in 2021 was 245 (79th percentile).  She is intolerant to statins.  She remains on aspirin therapy.  Continue aspirin 81 mg daily.  3. Essential hypertension Blood pressures well controlled.  Continue atenolol 50 mg daily, furosemide 20 mg daily.  4. Hyperlipidemia, unspecified hyperlipidemia type As noted, she is intolerant to statins.  Her LDL in October 2021 was 77 on diet alone.         Dispo:  Return in about 1 year (around 10/03/2021) for Routine follow up  in 1 year with Dr. Katrinka Blazing. .   Medication Adjustments/Labs and Tests Ordered: Current medicines are reviewed at length with the patient today.  Concerns regarding medicines are outlined above.  Tests Ordered: Orders Placed This Encounter  Procedures   EKG 12-Lead   Medication Changes: No orders of the defined types were placed in this encounter.   Signed, Tereso Newcomer, PA-C  10/03/2020 3:08 PM    [facility] [address] [place], [place], [place]  [address] Phone: [phone]; Fax: [phone]

## 2020-10-04 ENCOUNTER — Other Ambulatory Visit: Payer: Self-pay

## 2020-10-04 ENCOUNTER — Encounter (HOSPITAL_COMMUNITY)
Admission: RE | Admit: 2020-10-04 | Discharge: 2020-10-04 | Disposition: A | Discharge status: Home / Self Care | Payer: Medicare HMO | Source: Ambulatory Visit | Attending: Orthopedic Surgery | Admitting: Orthopedic Surgery

## 2020-10-04 ENCOUNTER — Encounter (HOSPITAL_COMMUNITY): Payer: Self-pay

## 2020-10-04 HISTORY — DX: Dyspnea, unspecified: R06.00

## 2020-10-04 NOTE — Progress Notes (Signed)
COVID Test Completed:10/13/20    PCP - Dr. Earney Navy LOV  10/03/20 Cardiologist - Dr. Mendel Ryder LOV 08/11/19  Chest x-ray - no EKG - 10/09/20-chart Stress Test - no ECHO - 08/31/19-epic Cardiac Cath - no Pacemaker/ICD device last checked:NA  Sleep Study - no Pt denies CPAP - no  Fasting Blood Sugar - NA Checks Blood Sugar _____ times a day  Blood Thinner Instructions:ASA 81/ Dr. Katrinka Blazing Aspirin Instructions:Stop 7 days prior to DOS/ Dr. Katrinka Blazing Last Dose:10/09/20  Anesthesia review: yes  Patient denies shortness of breath, fever, cough and chest pain at PAT appointment. Yes Pt reports the she doesn't get SOB doing housework or ADLs. She needs her inhaler mostly in the spring. PAT visit was by phone because she was in too much pain. Lab only visit will be 9/12//22   Patient verbalized understanding of instructions that were given to them at the PAT appointment. Patient was also instructed that they will need to review over the PAT instructions again at home before surgery. yes

## 2020-10-09 ENCOUNTER — Encounter (HOSPITAL_COMMUNITY)
Admission: RE | Admit: 2020-10-09 | Discharge: 2020-10-09 | Disposition: A | Discharge status: Home / Self Care | Payer: Medicare HMO | Source: Ambulatory Visit | Attending: Orthopedic Surgery | Admitting: Orthopedic Surgery

## 2020-10-09 ENCOUNTER — Other Ambulatory Visit: Payer: Self-pay

## 2020-10-09 ENCOUNTER — Encounter (HOSPITAL_COMMUNITY): Payer: Self-pay | Admitting: Emergency Medicine

## 2020-10-09 DIAGNOSIS — Z01812 Encounter for preprocedural laboratory examination: Secondary | ICD-10-CM | POA: Insufficient documentation

## 2020-10-09 LAB — CBC
HCT: 42.2 % (ref 36.0–46.0)
Hemoglobin: 13.3 g/dL (ref 12.0–15.0)
MCH: 28.5 pg (ref 26.0–34.0)
MCHC: 31.5 g/dL (ref 30.0–36.0)
MCV: 90.4 fL (ref 80.0–100.0)
Platelets: 367 10*3/uL (ref 150–400)
RBC: 4.67 MIL/uL (ref 3.87–5.11)
RDW: 12.5 % (ref 11.5–15.5)
WBC: 9.8 10*3/uL (ref 4.0–10.5)
nRBC: 0 % (ref 0.0–0.2)

## 2020-10-09 LAB — COMPREHENSIVE METABOLIC PANEL
ALT: 9 U/L (ref 0–44)
AST: 15 U/L (ref 15–41)
Albumin: 4.3 g/dL (ref 3.5–5.0)
Alkaline Phosphatase: 79 U/L (ref 38–126)
Anion gap: 10 (ref 5–15)
BUN: 14 mg/dL (ref 8–23)
CO2: 28 mmol/L (ref 22–32)
Calcium: 10.2 mg/dL (ref 8.9–10.3)
Chloride: 106 mmol/L (ref 98–111)
Creatinine, Ser: 1.09 mg/dL — ABNORMAL HIGH (ref 0.44–1.00)
GFR, Estimated: 52 mL/min — ABNORMAL LOW (ref >60)
Glucose, Bld: 96 mg/dL (ref 70–99)
Potassium: 5.1 mmol/L (ref 3.5–5.1)
Sodium: 144 mmol/L (ref 135–145)
Total Bilirubin: 0.3 mg/dL (ref 0.3–1.2)
Total Protein: 7.5 g/dL (ref 6.5–8.1)

## 2020-10-09 LAB — SURGICAL PCR SCREEN
MRSA, PCR: NEGATIVE
Staphylococcus aureus: NEGATIVE

## 2020-10-09 NOTE — Anesthesia Preprocedure Evaluation (Addendum)
Anesthesia Evaluation  Patient identified by MRN, date of birth, ID band Patient awake    Reviewed: Allergy & Precautions, NPO status , Patient's Chart, lab work & pertinent test results  History of Anesthesia Complications Negative for: history of anesthetic complications  Airway Mallampati: II  TM Distance: >3 FB Neck ROM: Full    Dental no notable dental hx. (+) Dental Advisory Given   Pulmonary former smoker,    Pulmonary exam normal        Cardiovascular hypertension, Pt. on medications and Pt. on home beta blockers Normal cardiovascular exam  Cardiologist is Verdis Prime, MD. Cleared for surgery at low risk by Tereso Newcomer, PA at last office visit 10/03/20 Echo 08/31/19:  1. Left ventricular ejection fraction, by estimation, is 60 to 65%. The left ventricle has normal function. The left ventricle has no regional wall motion abnormalities. Left ventricular diastolic parameters are consistent with Grade I diastolic  dysfunction (impaired relaxation).  2. Right ventricular systolic function is normal. The right ventricular size is normal. Tricuspid regurgitation signal is inadequate for assessing PA pressure.  3. The mitral valve is normal in structure. No evidence of mitral valve regurgitation. No evidence of mitral stenosis.  4. The aortic valve has an indeterminant number of cusps. Aortic valve regurgitation is not visualized. No aortic stenosis is present.  5. The inferior vena cava is normal in size with greater than 50% respiratory variability, suggesting right atrial pressure of 3 mmHg   Neuro/Psych negative neurological ROS     GI/Hepatic Neg liver ROS, GERD  ,  Endo/Other  Morbid obesity  Renal/GU negative Renal ROS     Musculoskeletal negative musculoskeletal ROS (+)   Abdominal   Peds  Hematology negative hematology ROS (+)   Anesthesia Other Findings   Reproductive/Obstetrics                            Anesthesia Physical Anesthesia Plan  ASA: 3  Anesthesia Plan: Spinal   Post-op Pain Management:    Induction:   PONV Risk Score and Plan: 2 and Ondansetron and Propofol infusion  Airway Management Planned: Natural Airway  Additional Equipment:   Intra-op Plan:   Post-operative Plan:   Informed Consent: I have reviewed the patients History and Physical, chart, labs and discussed the procedure including the risks, benefits and alternatives for the proposed anesthesia with the patient or authorized representative who has indicated his/her understanding and acceptance.     Dental advisory given  Plan Discussed with: Anesthesiologist and CRNA  Anesthesia Plan Comments: (See APP note by Joslyn Hy, FNP )      Anesthesia Quick Evaluation

## 2020-10-09 NOTE — Progress Notes (Signed)
Anesthesia Chart Review:   Case: [id number] Date/Time: 10/17/20 0700   Procedure: TOTAL HIP ARTHROPLASTY ANTERIOR APPROACH (Right: Hip)   Anesthesia type: Spinal   Pre-op diagnosis: Right hip osteoarthritis   Location: [facility] ROOM 09 / [facility]   Surgeons: Durene Romans, MD       DISCUSSION: Pt is 80 years old with hx coronary artery calcifications on CT, HTN  VS: Ht 5\' 3"  (1.6 m)   Wt 101 kg   BMI 39.44 kg/m   PROVIDERS: - PCP is , MD - Cardiologist is Elinor Dodge, MD. Cleared for surgery at low risk by Verdis Prime, PA at last office visit 10/03/20   LABS:  - CMP 10/09/20: Creatinine 1.09, otherwise normal - CBC 10/09/20: normal   IMAGES: CT cardiac scoring 08/31/19:  - Coronary calcium score of 245. This was 86 percentile for age and sex matched control.    EKG 10/03/20: NSR. Leftward axis   CV: Echo 08/31/19:  1. Left ventricular ejection fraction, by estimation, is 60 to 65%. The left ventricle has normal function. The left ventricle has no regional wall motion abnormalities. Left ventricular diastolic parameters are consistent with Grade I diastolic  dysfunction (impaired relaxation).   2. Right ventricular systolic function is normal. The right ventricular size is normal. Tricuspid regurgitation signal is inadequate for assessing PA pressure.   3. The mitral valve is normal in structure. No evidence of mitral valve regurgitation. No evidence of mitral stenosis.   4. The aortic valve has an indeterminant number of cusps. Aortic valve regurgitation is not visualized. No aortic stenosis is present.   5. The inferior vena cava is normal in size with greater than 50% respiratory variability, suggesting right atrial pressure of 3 mmHg.    Past Medical History:  Diagnosis Date   Acute meniscal injury of left knee    Complication of anesthesia    hypotention   Constipation    Dyspnea    spring needs inhaler   GERD (gastroesophageal reflux disease)     Hypertension    Hypoglycemia, unspecified    IC (interstitial cystitis)    Memory difficulties 07/18/2016   Sleep apnea    STOP-Bang =  5   Swelling of left knee joint     Past Surgical History:  Procedure Laterality Date   HEMORROIDECTOMY  1970's   KNEE ARTHROSCOPY Right 2009   KNEE ARTHROSCOPY Left 10/09/2012   Procedure: LEFT ARTHROSCOPY KNEE WITH medial meiscal DEBRIDEMENT;  Surgeon: 12/09/2012, MD;  Location: [facility];  Service: Orthopedics;  Laterality: Left;   LAPAROSCOPIC LYSIS OF ADHESIONS  1980'S   TONSILLECTOMY AND ADENOIDECTOMY  AGE 14   TOTAL ABDOMINAL HYSTERECTOMY W/ BILATERAL SALPINGOOPHORECTOMY  AGE 85    MEDICATIONS:  acetaminophen (TYLENOL) 500 MG tablet   albuterol (VENTOLIN HFA) 108 (90 Base) MCG/ACT inhaler   amitriptyline (ELAVIL) 50 MG tablet   aspirin 81 MG chewable tablet   atenolol (TENORMIN) 50 MG tablet   Carboxymethylcellul-Glycerin (LUBRICATING EYE DROPS OP)   cetirizine (ZYRTEC) 10 MG tablet   Cholecalciferol (VITAMIN D3) 75 MCG (3000 UT) TABS   diclofenac Sodium (VOLTAREN) 1 % GEL   fluticasone (FLONASE) 50 MCG/ACT nasal spray   furosemide (LASIX) 40 MG tablet   ibuprofen (ADVIL) 600 MG tablet   omeprazole (PRILOSEC) 20 MG capsule   polyethylene glycol powder (GLYCOLAX/MIRALAX) 17 GM/SCOOP powder   potassium chloride SA (K-DUR,KLOR-CON) 20 MEQ tablet   traMADol (ULTRAM) 50 MG tablet   No current  facility-administered medications for this encounter.    If no changes, I anticipate pt can proceed with surgery as scheduled.   Rica Mast, PhD, FNP-BC [facility]/Anesthesiology Phone: [phone] 10/09/2020 5:13 PM

## 2020-10-09 NOTE — H&P (Signed)
TOTAL HIP ADMISSION H&P  Patient is admitted for right total hip arthroplasty.  Subjective:  Chief Complaint: right hip pain  HPI: Marissa Dodson, 80 y.o. female, has a history of pain and functional disability in the right hip(s) due to arthritis and patient has failed non-surgical conservative treatments for greater than 12 weeks to include NSAID's and/or analgesics and activity modification.  Onset of symptoms was gradual starting 2 years ago with gradually worsening course since that time.The patient noted no past surgery on the right hip(s).  Patient currently rates pain in the right hip at 9 out of 10 with activity. Patient has worsening of pain with activity and weight bearing, pain that interfers with activities of daily living, and pain with passive range of motion. Patient has evidence of joint space narrowing by imaging studies. This condition presents safety issues increasing the risk of falls. There is no current active infection.  Patient Active Problem List   Diagnosis Date Noted   Angioedema 08/16/2019   Chronic rhinitis 08/16/2019   Intolerance, food 08/16/2019   Allergic reaction 08/16/2019   Memory difficulties 07/18/2016   Acute medial meniscal tear 10/08/2012   Past Medical History:  Diagnosis Date   Acute meniscal injury of left knee    Complication of anesthesia    hypotention   Constipation    Dyspnea    spring needs inhaler   GERD (gastroesophageal reflux disease)    Hypertension    Hypoglycemia, unspecified    IC (interstitial cystitis)    Memory difficulties 07/18/2016   Sleep apnea    STOP-Bang =  5   Swelling of left knee joint     Past Surgical History:  Procedure Laterality Date   HEMORROIDECTOMY  1970's   KNEE ARTHROSCOPY Right 2009   KNEE ARTHROSCOPY Left 10/09/2012   Procedure: LEFT ARTHROSCOPY KNEE WITH medial meiscal DEBRIDEMENT;  Surgeon: Loanne Drilling, MD;  Location: [facility];  Service: Orthopedics;  Laterality:  Left;   LAPAROSCOPIC LYSIS OF ADHESIONS  1980'S   TONSILLECTOMY AND ADENOIDECTOMY  AGE 63   TOTAL ABDOMINAL HYSTERECTOMY W/ BILATERAL SALPINGOOPHORECTOMY  AGE 30    No current facility-administered medications for this encounter.   Current Outpatient Medications  Medication Sig Dispense Refill Last Dose   acetaminophen (TYLENOL) 500 MG tablet Take 1,000 mg by mouth every 6 (six) hours as needed for headache or moderate pain.      albuterol (VENTOLIN HFA) 108 (90 Base) MCG/ACT inhaler Inhale 2 puffs into the lungs every 6 (six) hours as needed for wheezing or shortness of breath.      amitriptyline (ELAVIL) 50 MG tablet Take 50 mg by mouth at bedtime.      aspirin 81 MG chewable tablet Chew 81 mg by mouth daily.      atenolol (TENORMIN) 50 MG tablet Take 50 mg by mouth daily.      Carboxymethylcellul-Glycerin (LUBRICATING EYE DROPS OP) Place 1 drop into both eyes daily as needed (dry eyes).      cetirizine (ZYRTEC) 10 MG tablet Take 10 mg by mouth daily.      diclofenac Sodium (VOLTAREN) 1 % GEL Apply 1 application topically every 6 (six) hours as needed for pain. (Patient not taking: Reported on 10/03/2020)      fluticasone (FLONASE) 50 MCG/ACT nasal spray Place 2 sprays into both nostrils at bedtime.      furosemide (LASIX) 40 MG tablet Take 20 mg by mouth See admin instructions. Take 20 mg daily, may take  a second 20 mg dose as needed for swelling      ibuprofen (ADVIL) 600 MG tablet Take 600 mg by mouth every 6 (six) hours as needed for moderate pain.      omeprazole (PRILOSEC) 20 MG capsule Take 20 mg by mouth 2 (two) times daily.      polyethylene glycol powder (GLYCOLAX/MIRALAX) 17 GM/SCOOP powder Take 17 g by mouth daily.      potassium chloride SA (K-DUR,KLOR-CON) 20 MEQ tablet Take 20 mEq by mouth daily.      Cholecalciferol (VITAMIN D3) 75 MCG (3000 UT) TABS Take 3,000 Units by mouth daily.      traMADol (ULTRAM) 50 MG tablet Take 50 mg by mouth every 6 (six) hours as needed. (Patient  not taking: Reported on 10/03/2020)      Allergies  Allergen Reactions   Ketek [Telithromycin] Shortness Of Breath and Other (See Comments)   Metoprolol Other (See Comments)    "Lopressor" Hands hurt   Cephalosporins Swelling and Other (See Comments)    Urinary retention   Codeine Itching   Durezol [Difluprednate]     Redness in eyes   Flagyl [Metronidazole] Swelling   Levofloxacin     Headache, edema   Maxzide [Hydrochlorothiazide W-Triamterene] Swelling    Low urine output   Meloxicam     Edema, low urine output   Prednisone Swelling    Low urine output    Vistaril [Hydroxyzine]     Short term memory loss    Social History   Tobacco Use   Smoking status: Former    Packs/day: 0.50    Years: 20.00    Pack years: 10.00    Types: Cigarettes    Quit date: 10/02/1992    Years since quitting: 28.0   Smokeless tobacco: Never  Substance Use Topics   Alcohol use: No    Family History  Problem Relation Age of Onset   Heart disease Mother    Alzheimer's disease Mother    Prostate cancer Father    Diabetes Sister    Diabetes Brother    Liver disease Brother    Asthma Grandson    Allergic rhinitis Grandson    Eczema Grandson    Allergic rhinitis Daughter    Allergic rhinitis Daughter    Allergic rhinitis Son    Food Allergy Granddaughter    Immunodeficiency Neg Hx    Urticaria Neg Hx    Angioedema Neg Hx    Atopy Neg Hx      Review of Systems  Constitutional:  Negative for chills and fever.  Respiratory:  Negative for cough and shortness of breath.   Cardiovascular:  Negative for chest pain.  Gastrointestinal:  Negative for nausea and vomiting.  Musculoskeletal:  Positive for arthralgias.   Objective:  Physical Exam Well nourished and well developed. General: Alert and oriented x3, cooperative and pleasant, no acute distress. Head: normocephalic, atraumatic, neck supple. Eyes: EOMI.  Musculoskeletal: Right hip exam: She has a painful and limited right hip  range of motion with hip flexion internal rotation to 10 degrees external rotation to 20 degrees both reproducing pain over the anterior and anterior lateral aspect of the hip Neurovascular tact distally  Calves soft and nontender. Motor function intact in LE. Strength 5/5 LE bilaterally. Neuro: Distal pulses 2+. Sensation to light touch intact in LE.  Vital signs in last 24 hours: Temp:  [98.8 F (37.1 C)] 98.8 F (37.1 C) (09/12 1331) Pulse Rate:  [72] 72 (09/12 1331) Resp:  [  16] 16 (09/12 1331) BP: (133)/(68) 133/68 (09/12 1331) SpO2:  [98 %] 98 % (09/12 1331) Weight:  [106.6 kg] 106.6 kg (09/12 1331)  Labs:   Estimated body mass index is 41.63 kg/m as calculated from the following:   Height as of 10/09/20: 5\' 3"  (1.6 m).   Weight as of 10/09/20: 106.6 kg.   Imaging Review Plain radiographs demonstrate severe degenerative joint disease of the right hip(s). The bone quality appears to be adequate for age and reported activity level.      Assessment/Plan:  End stage arthritis, right hip(s)  The patient history, physical examination, clinical judgement of the provider and imaging studies are consistent with end stage degenerative joint disease of the right hip(s) and total hip arthroplasty is deemed medically necessary. The treatment options including medical management, injection therapy, arthroscopy and arthroplasty were discussed at length. The risks and benefits of total hip arthroplasty were presented and reviewed. The risks due to aseptic loosening, infection, stiffness, dislocation/subluxation,  thromboembolic complications and other imponderables were discussed.  The patient acknowledged the explanation, agreed to proceed with the plan and consent was signed. Patient is being admitted for inpatient treatment for surgery, pain control, PT, OT, prophylactic antibiotics, VTE prophylaxis, progressive ambulation and ADL's and discharge planning.The patient is planning to be  discharged  home.  Therapy Plans: HEP Disposition: Lives alone, Will be staying with a friend (both retired 12/09/20) Planned DVT Prophylaxis: aspirin 81mg  BID DME needed: walker, 3-n-1 PCP: Dr. Secretary/administrator, clearance received Cardiologist: Dr. , clearance received TXA: IV Allergies: cephalosporins - swelling (hands, feet, orthopnea), prednisone - respiratory distress, codeine - itching, Anesthesia Concerns: none BMI: 38.2 Last HgbA1c: Not diabetic  Other: - orthopnea, takes lasix - Tramadol/norco, tylenol, robaxin   Geralyn Flash, PA-C Orthopedic Surgery [facility] [place] [phone]

## 2020-10-13 ENCOUNTER — Other Ambulatory Visit: Payer: Self-pay | Admitting: Orthopedic Surgery

## 2020-10-14 LAB — SARS CORONAVIRUS 2 (TAT 6-24 HRS): SARS Coronavirus 2: NEGATIVE

## 2020-10-17 ENCOUNTER — Ambulatory Visit (HOSPITAL_COMMUNITY): Payer: Medicare HMO

## 2020-10-17 ENCOUNTER — Encounter (HOSPITAL_COMMUNITY): Payer: Self-pay | Admitting: Orthopedic Surgery

## 2020-10-17 ENCOUNTER — Ambulatory Visit (HOSPITAL_COMMUNITY): Payer: Medicare HMO | Admitting: Emergency Medicine

## 2020-10-17 ENCOUNTER — Observation Stay (HOSPITAL_COMMUNITY)
Admission: RE | Admit: 2020-10-17 | Discharge: 2020-10-18 | Disposition: A | Discharge status: Home / Self Care | Payer: Medicare HMO | Source: Ambulatory Visit | Attending: Orthopedic Surgery | Admitting: Orthopedic Surgery

## 2020-10-17 ENCOUNTER — Other Ambulatory Visit: Payer: Self-pay

## 2020-10-17 ENCOUNTER — Observation Stay (HOSPITAL_COMMUNITY): Payer: Medicare HMO

## 2020-10-17 ENCOUNTER — Encounter (HOSPITAL_COMMUNITY)
Admission: RE | Disposition: A | Discharge status: Home / Self Care | Payer: Self-pay | Source: Ambulatory Visit | Attending: Orthopedic Surgery

## 2020-10-17 ENCOUNTER — Ambulatory Visit (HOSPITAL_COMMUNITY): Payer: Medicare HMO | Admitting: Anesthesiology

## 2020-10-17 DIAGNOSIS — M1611 Unilateral primary osteoarthritis, right hip: Principal | ICD-10-CM | POA: Insufficient documentation

## 2020-10-17 DIAGNOSIS — Z96649 Presence of unspecified artificial hip joint: Secondary | ICD-10-CM

## 2020-10-17 DIAGNOSIS — I10 Essential (primary) hypertension: Secondary | ICD-10-CM | POA: Diagnosis not present

## 2020-10-17 DIAGNOSIS — Z87891 Personal history of nicotine dependence: Secondary | ICD-10-CM | POA: Diagnosis not present

## 2020-10-17 DIAGNOSIS — Z79899 Other long term (current) drug therapy: Secondary | ICD-10-CM | POA: Insufficient documentation

## 2020-10-17 DIAGNOSIS — Z419 Encounter for procedure for purposes other than remedying health state, unspecified: Secondary | ICD-10-CM

## 2020-10-17 HISTORY — DX: Presence of unspecified artificial hip joint: Z96.649

## 2020-10-17 HISTORY — PX: TOTAL HIP ARTHROPLASTY: SHX124

## 2020-10-17 LAB — TYPE AND SCREEN
ABO/RH(D): A POS
Antibody Screen: NEGATIVE

## 2020-10-17 LAB — ABO/RH: ABO/RH(D): A POS

## 2020-10-17 SURGERY — ARTHROPLASTY, HIP, TOTAL, ANTERIOR APPROACH
Anesthesia: Spinal | Site: Hip | Laterality: Right

## 2020-10-17 MED ORDER — LACTATED RINGERS IV SOLN: INTRAVENOUS | Status: DC

## 2020-10-17 MED ORDER — AMISULPRIDE (ANTIEMETIC) 5 MG/2ML IV SOLN: 10.0000 mg | Freq: Once | INTRAVENOUS | Status: DC | PRN

## 2020-10-17 MED ORDER — METHOCARBAMOL 500 MG PO TABS
500.0000 mg | ORAL_TABLET | Freq: Four times a day (QID) | ORAL | Status: DC | PRN
  Administered 2020-10-17 – 2020-10-18 (×3): 500 mg via ORAL
  Filled 2020-10-17 (×3): qty 1

## 2020-10-17 MED ORDER — ATENOLOL 50 MG PO TABS
50.0000 mg | ORAL_TABLET | Freq: Every day | ORAL | Status: DC
  Administered 2020-10-18: 50 mg via ORAL
  Filled 2020-10-17: qty 1

## 2020-10-17 MED ORDER — FUROSEMIDE 20 MG PO TABS
20.0000 mg | ORAL_TABLET | Freq: Every day | ORAL | Status: DC
  Administered 2020-10-17 – 2020-10-18 (×2): 20 mg via ORAL
  Filled 2020-10-17 (×2): qty 1

## 2020-10-17 MED ORDER — CARBOXYMETHYLCELLUL-GLYCERIN 0.5-0.9 % OP SOLN: 1.0000 [drp] | Freq: Every day | OPHTHALMIC | Status: DC | PRN

## 2020-10-17 MED ORDER — METOCLOPRAMIDE HCL 5 MG/ML IJ SOLN: 5.0000 mg | Freq: Three times a day (TID) | INTRAMUSCULAR | Status: DC | PRN

## 2020-10-17 MED ORDER — ALBUTEROL SULFATE (2.5 MG/3ML) 0.083% IN NEBU: 2.5000 mg | INHALATION_SOLUTION | Freq: Four times a day (QID) | RESPIRATORY_TRACT | Status: DC | PRN

## 2020-10-17 MED ORDER — FERROUS SULFATE 325 (65 FE) MG PO TABS
325.0000 mg | ORAL_TABLET | Freq: Three times a day (TID) | ORAL | Status: DC
  Administered 2020-10-18: 325 mg via ORAL
  Filled 2020-10-17: qty 1

## 2020-10-17 MED ORDER — ASPIRIN 81 MG PO CHEW
81.0000 mg | CHEWABLE_TABLET | Freq: Two times a day (BID) | ORAL | Status: DC
  Administered 2020-10-17 – 2020-10-18 (×2): 81 mg via ORAL
  Filled 2020-10-17 (×2): qty 1

## 2020-10-17 MED ORDER — DEXAMETHASONE SODIUM PHOSPHATE 10 MG/ML IJ SOLN
INTRAMUSCULAR | Status: DC | PRN
  Administered 2020-10-17: 10 mg via INTRAVENOUS

## 2020-10-17 MED ORDER — ORAL CARE MOUTH RINSE: 15.0000 mL | Freq: Once | OROMUCOSAL | Status: AC

## 2020-10-17 MED ORDER — ONDANSETRON HCL 4 MG/2ML IJ SOLN: 4.0000 mg | Freq: Four times a day (QID) | INTRAMUSCULAR | Status: DC | PRN

## 2020-10-17 MED ORDER — PROPOFOL 10 MG/ML IV BOLUS
INTRAVENOUS | Status: DC | PRN
  Administered 2020-10-17 (×2): 30 mg via INTRAVENOUS

## 2020-10-17 MED ORDER — MORPHINE SULFATE (PF) 2 MG/ML IV SOLN: 0.5000 mg | INTRAVENOUS | Status: DC | PRN

## 2020-10-17 MED ORDER — DOCUSATE SODIUM 100 MG PO CAPS
100.0000 mg | ORAL_CAPSULE | Freq: Two times a day (BID) | ORAL | Status: DC
  Administered 2020-10-17 – 2020-10-18 (×2): 100 mg via ORAL
  Filled 2020-10-17 (×2): qty 1

## 2020-10-17 MED ORDER — CEFAZOLIN SODIUM-DEXTROSE 2-4 GM/100ML-% IV SOLN
2.0000 g | Freq: Four times a day (QID) | INTRAVENOUS | Status: AC
Start: 2020-10-17 — End: 2020-10-17
  Administered 2020-10-17 (×2): 2 g via INTRAVENOUS
  Filled 2020-10-17 (×2): qty 100

## 2020-10-17 MED ORDER — ONDANSETRON HCL 4 MG PO TABS: 4.0000 mg | ORAL_TABLET | Freq: Four times a day (QID) | ORAL | Status: DC | PRN

## 2020-10-17 MED ORDER — HYDROCODONE-ACETAMINOPHEN 5-325 MG PO TABS
1.0000 | ORAL_TABLET | ORAL | Status: DC | PRN
  Administered 2020-10-17 (×4): 2 via ORAL
  Administered 2020-10-18: 1 via ORAL
  Administered 2020-10-18: 2 via ORAL
  Administered 2020-10-18: 1 via ORAL
  Filled 2020-10-17 (×6): qty 2
  Filled 2020-10-17: qty 1

## 2020-10-17 MED ORDER — PROPOFOL 1000 MG/100ML IV EMUL
INTRAVENOUS | Status: AC
  Filled 2020-10-17: qty 100

## 2020-10-17 MED ORDER — POVIDONE-IODINE 10 % EX SWAB
2.0000 "application " | Freq: Once | CUTANEOUS | Status: AC
  Administered 2020-10-17: 2 via TOPICAL

## 2020-10-17 MED ORDER — POTASSIUM CHLORIDE CRYS ER 20 MEQ PO TBCR
20.0000 meq | EXTENDED_RELEASE_TABLET | Freq: Every day | ORAL | Status: DC
  Administered 2020-10-17: 20 meq via ORAL
  Filled 2020-10-17: qty 1

## 2020-10-17 MED ORDER — PHENYLEPHRINE HCL-NACL 20-0.9 MG/250ML-% IV SOLN
INTRAVENOUS | Status: DC | PRN
  Administered 2020-10-17: 40 ug/min via INTRAVENOUS

## 2020-10-17 MED ORDER — PROPOFOL 10 MG/ML IV BOLUS
INTRAVENOUS | Status: AC
  Filled 2020-10-17: qty 20

## 2020-10-17 MED ORDER — SODIUM CHLORIDE 0.9 % IR SOLN
Status: DC | PRN
  Administered 2020-10-17: 1000 mL

## 2020-10-17 MED ORDER — AMITRIPTYLINE HCL 50 MG PO TABS
50.0000 mg | ORAL_TABLET | Freq: Every day | ORAL | Status: DC
  Administered 2020-10-17: 50 mg via ORAL
  Filled 2020-10-17 (×2): qty 1

## 2020-10-17 MED ORDER — DEXAMETHASONE SODIUM PHOSPHATE 10 MG/ML IJ SOLN: 8.0000 mg | Freq: Once | INTRAMUSCULAR | Status: DC

## 2020-10-17 MED ORDER — ACETAMINOPHEN 325 MG PO TABS: 325.0000 mg | ORAL_TABLET | Freq: Four times a day (QID) | ORAL | Status: DC | PRN

## 2020-10-17 MED ORDER — PHENOL 1.4 % MT LIQD: 1.0000 | OROMUCOSAL | Status: DC | PRN

## 2020-10-17 MED ORDER — LORATADINE 10 MG PO TABS
10.0000 mg | ORAL_TABLET | Freq: Every day | ORAL | Status: DC
  Administered 2020-10-17 – 2020-10-18 (×2): 10 mg via ORAL
  Filled 2020-10-17 (×2): qty 1

## 2020-10-17 MED ORDER — FENTANYL CITRATE (PF) 100 MCG/2ML IJ SOLN
INTRAMUSCULAR | Status: AC
  Filled 2020-10-17: qty 2

## 2020-10-17 MED ORDER — DEXAMETHASONE SODIUM PHOSPHATE 10 MG/ML IJ SOLN
INTRAMUSCULAR | Status: AC
  Filled 2020-10-17: qty 1

## 2020-10-17 MED ORDER — ALBUTEROL SULFATE HFA 108 (90 BASE) MCG/ACT IN AERS
INHALATION_SPRAY | RESPIRATORY_TRACT | Status: AC
  Filled 2020-10-17: qty 6.7

## 2020-10-17 MED ORDER — DIPHENHYDRAMINE HCL 12.5 MG/5ML PO ELIX: 12.5000 mg | ORAL_SOLUTION | ORAL | Status: DC | PRN

## 2020-10-17 MED ORDER — PANTOPRAZOLE SODIUM 40 MG PO TBEC
40.0000 mg | DELAYED_RELEASE_TABLET | Freq: Every day | ORAL | Status: DC
  Administered 2020-10-18: 40 mg via ORAL
  Filled 2020-10-17: qty 1

## 2020-10-17 MED ORDER — FLUTICASONE PROPIONATE 50 MCG/ACT NA SUSP
2.0000 | Freq: Every day | NASAL | Status: DC
  Filled 2020-10-17: qty 16

## 2020-10-17 MED ORDER — METHOCARBAMOL 500 MG IVPB - SIMPLE MED
500.0000 mg | Freq: Four times a day (QID) | INTRAVENOUS | Status: DC | PRN
  Filled 2020-10-17: qty 50

## 2020-10-17 MED ORDER — POLYETHYLENE GLYCOL 3350 17 G PO PACK
17.0000 g | PACK | Freq: Every day | ORAL | Status: DC | PRN
Start: 2020-10-17 — End: 2020-10-18

## 2020-10-17 MED ORDER — TRANEXAMIC ACID-NACL 1000-0.7 MG/100ML-% IV SOLN
1000.0000 mg | Freq: Once | INTRAVENOUS | Status: AC
  Administered 2020-10-17: 1000 mg via INTRAVENOUS
  Filled 2020-10-17: qty 100

## 2020-10-17 MED ORDER — CHLORHEXIDINE GLUCONATE 0.12 % MT SOLN
15.0000 mL | Freq: Once | OROMUCOSAL | Status: AC
  Administered 2020-10-17: 15 mL via OROMUCOSAL

## 2020-10-17 MED ORDER — PROMETHAZINE HCL 25 MG/ML IJ SOLN: 6.2500 mg | INTRAMUSCULAR | Status: DC | PRN

## 2020-10-17 MED ORDER — PHENYLEPHRINE HCL (PRESSORS) 10 MG/ML IV SOLN
INTRAVENOUS | Status: AC
  Filled 2020-10-17: qty 2

## 2020-10-17 MED ORDER — METOCLOPRAMIDE HCL 5 MG PO TABS: 5.0000 mg | ORAL_TABLET | Freq: Three times a day (TID) | ORAL | Status: DC | PRN

## 2020-10-17 MED ORDER — TRANEXAMIC ACID-NACL 1000-0.7 MG/100ML-% IV SOLN
1000.0000 mg | INTRAVENOUS | Status: AC
  Administered 2020-10-17: 1000 mg via INTRAVENOUS
  Filled 2020-10-17: qty 100

## 2020-10-17 MED ORDER — FENTANYL CITRATE PF 50 MCG/ML IJ SOSY: 25.0000 ug | PREFILLED_SYRINGE | INTRAMUSCULAR | Status: DC | PRN

## 2020-10-17 MED ORDER — BUPIVACAINE IN DEXTROSE 0.75-8.25 % IT SOLN
INTRATHECAL | Status: DC | PRN
  Administered 2020-10-17: 1.6 mL via INTRATHECAL

## 2020-10-17 MED ORDER — ACETAMINOPHEN 500 MG PO TABS
1000.0000 mg | ORAL_TABLET | Freq: Once | ORAL | Status: AC
  Administered 2020-10-17: 1000 mg via ORAL
  Filled 2020-10-17: qty 2

## 2020-10-17 MED ORDER — CEFAZOLIN SODIUM-DEXTROSE 2-4 GM/100ML-% IV SOLN
2.0000 g | INTRAVENOUS | Status: AC
  Administered 2020-10-17: 2 g via INTRAVENOUS
  Filled 2020-10-17: qty 100

## 2020-10-17 MED ORDER — FENTANYL CITRATE (PF) 100 MCG/2ML IJ SOLN
INTRAMUSCULAR | Status: DC | PRN
  Administered 2020-10-17: 100 ug via INTRAVENOUS

## 2020-10-17 MED ORDER — PROPOFOL 500 MG/50ML IV EMUL
INTRAVENOUS | Status: DC | PRN
  Administered 2020-10-17: 100 ug/kg/min via INTRAVENOUS

## 2020-10-17 MED ORDER — SODIUM CHLORIDE 0.9 % IV SOLN: INTRAVENOUS | Status: DC

## 2020-10-17 MED ORDER — TRAMADOL HCL 50 MG PO TABS: 50.0000 mg | ORAL_TABLET | Freq: Four times a day (QID) | ORAL | Status: DC | PRN

## 2020-10-17 MED ORDER — BISACODYL 10 MG RE SUPP: 10.0000 mg | Freq: Every day | RECTAL | Status: DC | PRN

## 2020-10-17 MED ORDER — MENTHOL 3 MG MT LOZG: 1.0000 | LOZENGE | OROMUCOSAL | Status: DC | PRN

## 2020-10-17 SURGICAL SUPPLY — 41 items
ARTICULEZE HEAD (Hips) ×2 IMPLANT
BAG COUNTER SPONGE SURGICOUNT (BAG) IMPLANT
BAG DECANTER FOR FLEXI CONT (MISCELLANEOUS) IMPLANT
BAG ZIPLOCK 12X15 (MISCELLANEOUS) IMPLANT
BLADE SAG 18X100X1.27 (BLADE) ×2 IMPLANT
COVER PERINEAL POST (MISCELLANEOUS) ×2 IMPLANT
COVER SURGICAL LIGHT HANDLE (MISCELLANEOUS) ×2 IMPLANT
CUP ACETBLR 52 OD PINNACLE (Hips) ×2 IMPLANT
DERMABOND ADVANCED (GAUZE/BANDAGES/DRESSINGS) ×1
DERMABOND ADVANCED .7 DNX12 (GAUZE/BANDAGES/DRESSINGS) ×1 IMPLANT
DRAPE FOOT SWITCH (DRAPES) ×2 IMPLANT
DRAPE STERI IOBAN 125X83 (DRAPES) ×2 IMPLANT
DRAPE U-SHAPE 47X51 STRL (DRAPES) ×4 IMPLANT
DRESSING AQUACEL AG SP 3.5X10 (GAUZE/BANDAGES/DRESSINGS) ×1 IMPLANT
DRSG AQUACEL AG ADV 3.5X10 (GAUZE/BANDAGES/DRESSINGS) ×2 IMPLANT
DRSG AQUACEL AG SP 3.5X10 (GAUZE/BANDAGES/DRESSINGS) ×2
DURAPREP 26ML APPLICATOR (WOUND CARE) ×2 IMPLANT
ELECT REM PT RETURN 15FT ADLT (MISCELLANEOUS) ×2 IMPLANT
ELIMINATOR HOLE APEX DEPUY (Hips) ×2 IMPLANT
GLOVE SURG ENC MOIS LTX SZ6 (GLOVE) ×4 IMPLANT
GLOVE SURG UNDER LTX SZ7.5 (GLOVE) ×2 IMPLANT
GLOVE SURG UNDER POLY LF SZ6.5 (GLOVE) ×2 IMPLANT
GLOVE SURG UNDER POLY LF SZ7.5 (GLOVE) ×4 IMPLANT
GOWN STRL REUS W/TWL LRG LVL3 (GOWN DISPOSABLE) ×4 IMPLANT
HEAD ARTICULEZE (Hips) ×1 IMPLANT
HOLDER FOLEY CATH W/STRAP (MISCELLANEOUS) ×2 IMPLANT
KIT TURNOVER KIT A (KITS) ×2 IMPLANT
LINER NEUTRAL 52X36MM PLUS 4 (Liner) ×2 IMPLANT
PACK ANTERIOR HIP CUSTOM (KITS) ×2 IMPLANT
PENCIL SMOKE EVACUATOR (MISCELLANEOUS) IMPLANT
SCREW 6.5MMX30MM (Screw) ×2 IMPLANT
STEM FEMORAL SZ6 HIGH ACTIS (Stem) ×2 IMPLANT
SUT MNCRL AB 4-0 PS2 18 (SUTURE) ×2 IMPLANT
SUT STRATAFIX 0 PDS 27 VIOLET (SUTURE) ×2
SUT VIC AB 1 CT1 36 (SUTURE) ×6 IMPLANT
SUT VIC AB 2-0 CT1 27 (SUTURE) ×4
SUT VIC AB 2-0 CT1 TAPERPNT 27 (SUTURE) ×2 IMPLANT
SUTURE STRATFX 0 PDS 27 VIOLET (SUTURE) ×1 IMPLANT
TRAY FOLEY MTR SLVR 16FR STAT (SET/KITS/TRAYS/PACK) IMPLANT
TUBE SUCTION HIGH CAP CLEAR NV (SUCTIONS) ×2 IMPLANT
WATER STERILE IRR 1000ML POUR (IV SOLUTION) ×2 IMPLANT

## 2020-10-17 NOTE — Interval H&P Note (Signed)
History and Physical Interval Note:  10/17/2020 6:50 AM  Marissa Dodson  has presented today for surgery, with the diagnosis of Right hip osteoarthritis.  The various methods of treatment have been discussed with the patient and family. After consideration of risks, benefits and other options for treatment, the patient has consented to  Procedure(s): TOTAL HIP ARTHROPLASTY ANTERIOR APPROACH (Right) as a surgical intervention.  The patient's history has been reviewed, patient examined, no change in status, stable for surgery.  I have reviewed the patient's chart and labs.  Questions were answered to the patient's satisfaction.     Shelda Pal

## 2020-10-17 NOTE — Op Note (Signed)
NAME:  MEGHIN THIVIERGE NO.: [id number]      MEDICAL RECORD NO.: [id number]      FACILITY:  [facility]      PHYSICIAN:  Shelda Pal  DATE OF BIRTH:  November 11, 1940     DATE OF PROCEDURE:  10/17/2020                                 OPERATIVE REPORT         PREOPERATIVE DIAGNOSIS: Right  hip osteoarthritis.      POSTOPERATIVE DIAGNOSIS:  Right hip osteoarthritis.      PROCEDURE:  Right total hip replacement through an anterior approach   utilizing DePuy THR system, component size 52 mm pinnacle cup, a size 36+4 neutral   Altrex liner, a size 6 Hi Actis stem with a 36+5 Articuleze metal head ball.      SURGEON:  Madlyn Frankel. Charlann Boxer, M.D.      ASSISTANT:  Rosalene Billings, PA-C     ANESTHESIA:  Spinal.      SPECIMENS:  None.      COMPLICATIONS:  None.      BLOOD LOSS:  300 cc     DRAINS:  None.      INDICATION OF THE PROCEDURE:  Marissa Dodson is a 80 y.o. female who had   presented to office for evaluation of right hip pain.  Radiographs revealed   progressive degenerative changes with bone-on-bone   articulation of the  hip joint, including subchondral cystic changes and osteophytes.  The patient had painful limited range of   motion significantly affecting their overall quality of life and function.  The patient was failing to    respond to conservative measures including medications and/or injections and activity modification and at this point was ready   to proceed with more definitive measures.  Consent was obtained for   benefit of pain relief.  Specific risks of infection, DVT, component   failure, dislocation, neurovascular injury, and need for revision surgery were reviewed in the office as well discussion of   the anterior versus posterior approach were reviewed.     PROCEDURE IN DETAIL:  The patient was brought to operative theater.   Once adequate anesthesia, preoperative antibiotics, 2 gm of Ancef, 1 gm of Tranexamic  Acid, and 10 mg of Decadron were administered, the patient was positioned supine on the [employer] table.  Once the patient was safely positioned with adequate padding of boney prominences we predraped out the hip, and used fluoroscopy to confirm orientation of the pelvis.      The right hip was then prepped and draped from proximal iliac crest to   mid thigh with a shower curtain technique.      Time-out was performed identifying the patient, planned procedure, and the appropriate extremity.     An incision was then made 2 cm lateral to the   anterior superior iliac spine extending over the orientation of the   tensor fascia lata muscle and sharp dissection was carried down to the   fascia of the muscle.      The fascia was then incised.  The muscle belly was identified and swept   laterally and retractor placed along the superior neck.  Following   cauterization of the circumflex vessels and removing some pericapsular  fat, a second cobra retractor was placed on the inferior neck.  A T-capsulotomy was made along the line of the   superior neck to the trochanteric fossa, then extended proximally and   distally.  Tag sutures were placed and the retractors were then placed   intracapsular.  We then identified the trochanteric fossa and   orientation of my neck cut and then made a neck osteotomy with the femur on traction.  The femoral   head was removed without difficulty or complication.  Traction was let   off and retractors were placed posterior and anterior around the   acetabulum.      The labrum and foveal tissue were debrided.  I began reaming with a 45 mm   reamer and reamed up to 51 mm reamer with good bony bed preparation and a 52 mm  cup was chosen.  The final 52 mm Pinnacle cup was then impacted under fluoroscopy to confirm the depth of penetration and orientation with respect to   Abduction and forward flexion.  A screw was placed into the ilium followed by the hole eliminator.   The final   36+4 neutral Altrex liner was impacted with good visualized rim fit.  The cup was positioned anatomically within the acetabular portion of the pelvis.      At this point, the femur was rolled to 100 degrees.  Further capsule was   released off the inferior aspect of the femoral neck.  I then   released the superior capsule proximally.  With the leg in a neutral position the hook was placed laterally   along the femur under the vastus lateralis origin and elevated manually and then held in position using the hook attachment on the bed.  The leg was then extended and adducted with the leg rolled to 100   degrees of external rotation.  Retractors were placed along the medial calcar and posteriorly over the greater trochanter.  Once the proximal femur was fully   exposed, I used a box osteotome to set orientation.  I then began   broaching with the starting chili pepper broach and passed this by hand and then broached up to 6.  With the 6 broach in place I chose a high offset neck and did several trial reductions.  The offset was appropriate, leg lengths   appeared to be equal best matched with the +5 head ball trial confirmed radiographically.   Given these findings, I went ahead and dislocated the hip, repositioned all   retractors and positioned the right hip in the extended and abducted position.  The final 6 Hi Actis stem was   chosen and it was impacted down to the level of neck cut.  Based on this   and the trial reductions, a final 36+5 Articuleze metal head  ball was chosen and   impacted onto a clean and dry trunnion, and the hip was reduced.  The   hip had been irrigated throughout the case again at this point.  I did   reapproximate the superior capsular leaflet to the anterior leaflet   using #1 Vicryl.  The fascia of the   tensor fascia lata muscle was then reapproximated using #1 Vicryl and #0 Stratafix sutures.  The   remaining wound was closed with 2-0 Vicryl and  running 4-0 Monocryl.   The hip was cleaned, dried, and dressed sterilely using Dermabond and   Aquacel dressing.  The patient was then brought   to recovery  room in stable condition tolerating the procedure well.    Rosalene Billings, PA-C was present for the entirety of the case involved from   preoperative positioning, perioperative retractor management, general   facilitation of the case, as well as primary wound closure as assistant.            Madlyn Frankel Charlann Boxer, M.D.        10/17/2020 8:27 AM

## 2020-10-17 NOTE — Anesthesia Postprocedure Evaluation (Signed)
Anesthesia Post Note  Patient: Marissa Dodson  Procedure(s) Performed: TOTAL HIP ARTHROPLASTY ANTERIOR APPROACH (Right: Hip)     Patient location during evaluation: PACU Anesthesia Type: Spinal Level of consciousness: awake and alert Pain management: pain level controlled Vital Signs Assessment: post-procedure vital signs reviewed and stable Respiratory status: spontaneous breathing and respiratory function stable Cardiovascular status: blood pressure returned to baseline and stable Postop Assessment: spinal receding Anesthetic complications: no   No notable events documented.  Last Vitals:  Vitals:   10/17/20 0930 10/17/20 0945  BP: 136/67 134/65  Pulse: (!) 57 (!) 57  Resp: 19 20  Temp:  36.4 C  SpO2: 100% 100%    Last Pain:  Vitals:   10/17/20 0945  TempSrc:   PainSc: 0-No pain                 Nellie Chevalier DANIEL

## 2020-10-17 NOTE — Anesthesia Procedure Notes (Signed)
Spinal  Patient location during procedure: OR Start time: 10/17/2020 7:07 AM End time: 10/17/2020 7:15 AM Reason for block: surgical anesthesia Staffing Performed: anesthesiologist  Anesthesiologist: Heather Roberts, MD Preanesthetic Checklist Completed: patient identified, IV checked, risks and benefits discussed, surgical consent, monitors and equipment checked, pre-op evaluation and timeout performed Spinal Block Patient position: sitting Prep: DuraPrep Patient monitoring: cardiac monitor, continuous pulse ox and blood pressure Approach: midline Location: L2-3 Injection technique: single-shot Needle Needle type: Pencan  Needle gauge: 24 G Needle length: 9 cm Assessment Events: CSF return Additional Notes Functioning IV was confirmed and monitors were applied. Sterile prep and drape, including hand hygiene and sterile gloves were used. The patient was positioned and the spine was prepped. The skin was anesthetized with lidocaine.  Free flow of clear CSF was obtained prior to injecting local anesthetic into the CSF.  The spinal needle aspirated freely following injection.  The needle was carefully withdrawn.  The patient tolerated the procedure well.

## 2020-10-17 NOTE — Plan of Care (Signed)
Plan of care reviewed and discussed with the patient. 

## 2020-10-17 NOTE — Evaluation (Signed)
Physical Therapy Evaluation Patient Details Name: Marissa Dodson MRN: [id number] DOB: [birth year] Today's Date: 10/17/2020  History of Present Illness  Patient is 80 y.o. female s/p Rt THA anterior approach on 10/17/20 with PMH significant for GERD, HTN.   Clinical Impression  Marissa Dodson is a 80 y.o. female POD 0 s/p Rt THA. Patient reports independence with mobility at baseline. Patient is now limited by functional impairments (see PT problem list below) and requires min assist for transfers and gait with RW. Patient was able to ambulate ~50 feet with RW and min assist. Patient instructed in exercise to facilitate circulation to manage edema and reduce risk of DVT. Patient will benefit from continued skilled PT interventions to address impairments and progress towards PLOF. Acute PT will follow to progress mobility and stair training in preparation for safe discharge home.        Recommendations for follow up therapy are one component of a multi-disciplinary discharge planning process, led by the attending physician.  Recommendations may be updated based on patient status, additional functional criteria and insurance authorization.  Follow Up Recommendations Follow surgeon's recommendation for DC plan and follow-up therapies;Home health PT    Equipment Recommendations  Rolling walker with 5" wheels    Recommendations for Other Services       Precautions / Restrictions Precautions Precautions: Fall Restrictions Weight Bearing Restrictions: No      Mobility  Bed Mobility Overal bed mobility: Needs Assistance Bed Mobility: Supine to Sit     Supine to sit: Min assist;HOB elevated     General bed mobility comments: cues to use bed rail and assist to bring Rt LE off EOB    Transfers Overall transfer level: Needs assistance Equipment used: Rolling walker (2 wheeled) Transfers: Sit to/from Stand Sit to Stand: Min assist         General transfer comment: cues for  hand placement and assist to initiate power up from EOB.  Ambulation/Gait Ambulation/Gait assistance: Min assist Gait Distance (Feet): 50 Feet Assistive device: Rolling walker (2 wheeled) Gait Pattern/deviations: Step-to pattern;Decreased stride length;Decreased weight shift to right Gait velocity: decr   General Gait Details: cues for step to pattern and to maintain safe proximity to RW, no overt LOB noted throughout.  Stairs            Wheelchair Mobility    Modified Rankin (Stroke Patients Only)       Balance Overall balance assessment: Needs assistance Sitting-balance support: Feet supported Sitting balance-Leahy Scale: Good     Standing balance support: During functional activity;Bilateral upper extremity supported Standing balance-Leahy Scale: Poor                               Pertinent Vitals/Pain Pain Assessment: 0-10 Pain Score: 3  Pain Location: Rt hip Pain Descriptors / Indicators: Aching;Discomfort Pain Intervention(s): Limited activity within patient's tolerance;Monitored during session;Repositioned    Home Living Family/patient expects to be discharged to:: Private residence (pt lives in apt, 1 level, no steps to get in. has tubshower) Living Arrangements: Alone Available Help at Discharge: Friend(s) Type of Home: House Home Access: Stairs to enter Entrance Stairs-Rails: Right Entrance Stairs-Number of Steps: 3 Home Layout: Able to live on main level with bedroom/bathroom;Full bath on main level Home Equipment: Cane - single point Additional Comments: dc to friends home    Prior Function Level of Independence: Independent;Independent with assistive device(s)  Comments: occasional use of SPC     Hand Dominance        Extremity/Trunk Assessment   Upper Extremity Assessment Upper Extremity Assessment: Overall WFL for tasks assessed    Lower Extremity Assessment Lower Extremity Assessment: Generalized weakness     Cervical / Trunk Assessment Cervical / Trunk Assessment: Normal  Communication   Communication: No difficulties  Cognition Arousal/Alertness: Awake/alert Behavior During Therapy: WFL for tasks assessed/performed Overall Cognitive Status: Within Functional Limits for tasks assessed                                        General Comments      Exercises Total Joint Exercises Ankle Circles/Pumps: AROM;Both;20 reps;Seated   Assessment/Plan    PT Assessment Patient needs continued PT services  PT Problem List Decreased strength;Decreased range of motion;Decreased activity tolerance;Decreased balance;Decreased mobility;Decreased knowledge of use of DME;Decreased knowledge of precautions       PT Treatment Interventions DME instruction;Gait training;Stair training;Functional mobility training;Therapeutic activities;Therapeutic exercise;Balance training;Patient/family education    PT Goals (Current goals can be found in the Care Plan section)  Acute Rehab PT Goals Patient Stated Goal: return home and get moving independently again PT Goal Formulation: With patient Time For Goal Achievement: 10/24/20 Potential to Achieve Goals: Good    Frequency 7X/week   Barriers to discharge        Co-evaluation               AM-PAC PT "6 Clicks" Mobility  Outcome Measure Help needed turning from your back to your side while in a flat bed without using bedrails?: A Little Help needed moving from lying on your back to sitting on the side of a flat bed without using bedrails?: A Little Help needed moving to and from a bed to a chair (including a wheelchair)?: A Little Help needed standing up from a chair using your arms (e.g., wheelchair or bedside chair)?: A Little Help needed to walk in hospital room?: A Little Help needed climbing 3-5 steps with a railing? : A Little 6 Click Score: 18    End of Session Equipment Utilized During Treatment: Gait belt Activity  Tolerance: Patient tolerated treatment well Patient left: in chair;with call bell/phone within reach;with chair alarm set Nurse Communication: Mobility status PT Visit Diagnosis: Muscle weakness (generalized) (M62.81);Difficulty in walking, not elsewhere classified (R26.2)    Time: 0932-3557 PT Time Calculation (min) (ACUTE ONLY): 28 min   Charges:   PT Evaluation $PT Eval Low Complexity: 1 Low PT Treatments $Gait Training: 8-22 mins        Wynn Maudlin, DPT Acute Rehabilitation Services Office [phone] Pager [phone]   Anitra Lauth 10/17/2020, 3:35 PM

## 2020-10-17 NOTE — Transfer of Care (Signed)
Immediate Anesthesia Transfer of Care Note  Patient: Marissa Dodson  Procedure(s) Performed: TOTAL HIP ARTHROPLASTY ANTERIOR APPROACH (Right: Hip)  Patient Location: PACU  Anesthesia Type:Spinal  Level of Consciousness: awake and drowsy  Airway & Oxygen Therapy: Patient Spontanous Breathing and Patient connected to face mask oxygen  Post-op Assessment: Report given to RN and Post -op Vital signs reviewed and stable  Post vital signs: Reviewed and stable  Last Vitals:  Vitals Value Taken Time  BP 111/58 10/17/20 0846  Temp    Pulse 65 10/17/20 0848  Resp 15 10/17/20 0848  SpO2 100 % 10/17/20 0848  Vitals shown include unvalidated device data.  Last Pain:  Vitals:   10/17/20 2423  TempSrc: Oral  PainSc:       Patients Stated Pain Goal: 5 (10/17/20 0615)  Complications: No notable events documented.

## 2020-10-18 DIAGNOSIS — M1611 Unilateral primary osteoarthritis, right hip: Secondary | ICD-10-CM | POA: Diagnosis not present

## 2020-10-18 LAB — BASIC METABOLIC PANEL
Anion gap: 7 (ref 5–15)
BUN: 12 mg/dL (ref 8–23)
CO2: 27 mmol/L (ref 22–32)
Calcium: 8.9 mg/dL (ref 8.9–10.3)
Chloride: 101 mmol/L (ref 98–111)
Creatinine, Ser: 0.82 mg/dL (ref 0.44–1.00)
GFR, Estimated: >60 mL/min (ref >60)
Glucose, Bld: 130 mg/dL — ABNORMAL HIGH (ref 70–99)
Potassium: 5.1 mmol/L (ref 3.5–5.1)
Sodium: 135 mmol/L (ref 135–145)

## 2020-10-18 LAB — CBC
HCT: 37.8 % (ref 36.0–46.0)
Hemoglobin: 12.3 g/dL (ref 12.0–15.0)
MCH: 28.7 pg (ref 26.0–34.0)
MCHC: 32.5 g/dL (ref 30.0–36.0)
MCV: 88.1 fL (ref 80.0–100.0)
Platelets: 289 10*3/uL (ref 150–400)
RBC: 4.29 MIL/uL (ref 3.87–5.11)
RDW: 12.3 % (ref 11.5–15.5)
WBC: 15.7 10*3/uL — ABNORMAL HIGH (ref 4.0–10.5)
nRBC: 0 % (ref 0.0–0.2)

## 2020-10-18 MED ORDER — ASPIRIN 81 MG PO CHEW: 81.0000 mg | CHEWABLE_TABLET | Freq: Two times a day (BID) | ORAL | 0 refills | Status: AC

## 2020-10-18 MED ORDER — HYDROCODONE-ACETAMINOPHEN 5-325 MG PO TABS: 1.0000 | ORAL_TABLET | Freq: Four times a day (QID) | ORAL | 0 refills | Status: DC | PRN

## 2020-10-18 MED ORDER — METHOCARBAMOL 500 MG PO TABS: 500.0000 mg | ORAL_TABLET | Freq: Four times a day (QID) | ORAL | 0 refills | Status: DC | PRN

## 2020-10-18 MED ORDER — DOCUSATE SODIUM 100 MG PO CAPS: 100.0000 mg | ORAL_CAPSULE | Freq: Two times a day (BID) | ORAL | 0 refills | Status: DC

## 2020-10-18 NOTE — Progress Notes (Signed)
   Subjective: 1 Day Post-Op Procedure(s) (LRB): TOTAL HIP ARTHROPLASTY ANTERIOR APPROACH (Right) Patient reports pain as mild.   Patient seen in rounds for Dr. Charlann Boxer. Patient is well, and has had no acute complaints or problems. She requests that I change her lasix dose to this AM rather than afternoon. She ambulated 50 feet with PT. Voiding without difficulty.  We will continue therapy today.   Objective: Vital signs in last 24 hours: Temp:  [97.3 F (36.3 C)-98.7 F (37.1 C)] 97.9 F (36.6 C) (09/21 0522) Pulse Rate:  [57-102] 87 (09/21 0522) Resp:  [15-20] 17 (09/21 0522) BP: (111-159)/(58-86) 136/86 (09/21 0522) SpO2:  [94 %-100 %] 100 % (09/21 0522)  Intake/Output from previous day:  Intake/Output Summary (Last 24 hours) at 10/18/2020 0827 Last data filed at 10/18/2020 0522 Gross per 24 hour  Intake 1383.9 ml  Output 900 ml  Net 483.9 ml     Intake/Output this shift: No intake/output data recorded.  Labs: Recent Labs    10/18/20 0311  HGB 12.3   Recent Labs    10/18/20 0311  WBC 15.7*  RBC 4.29  HCT 37.8  PLT 289   Recent Labs    10/18/20 0311  NA 135  K 5.1  CL 101  CO2 27  BUN 12  CREATININE 0.82  GLUCOSE 130*  CALCIUM 8.9   No results for input(s): LABPT, INR in the last 72 hours.  Exam: General - Patient is Alert and Oriented Extremity - Neurologically intact Sensation intact distally Intact pulses distally Dressing - dressing C/D/I Motor Function - intact, moving foot and toes well on exam.   Past Medical History:  Diagnosis Date   Acute meniscal injury of left knee    Complication of anesthesia    hypotention   Constipation    Dyspnea    spring needs inhaler   GERD (gastroesophageal reflux disease)    Hypertension    Hypoglycemia, unspecified    IC (interstitial cystitis)    Memory difficulties 07/18/2016   Sleep apnea    STOP-Bang =  5   Swelling of left knee joint     Assessment/Plan: 1 Day Post-Op Procedure(s)  (LRB): TOTAL HIP ARTHROPLASTY ANTERIOR APPROACH (Right) Active Problems:   S/P right total hip arthroplasty  Estimated body mass index is 41.63 kg/m as calculated from the following:   Height as of this encounter: 5\' 3"  (1.6 m).   Weight as of this encounter: 106.6 kg. Advance diet Up with therapy D/C IV fluids  DVT Prophylaxis - Aspirin Weight bearing as tolerated.  Plan is to go Home after hospital stay. Plan for discharge today after 1-2 sessions of therapy as long as she is meeting her goals. Follow up in the office in 2 weeks.   , PA-C Orthopedic Surgery [phone] 10/18/2020, 8:27 AM

## 2020-10-18 NOTE — Progress Notes (Signed)
Physical Therapy Treatment Patient Details Name: Marissa Dodson MRN: [id number] DOB: 01-04-1941 Today's Date: 10/18/2020   History of Present Illness Patient is 80 y.o. female s/p Rt THA anterior approach on 10/17/20 with PMH significant for GERD, HTN.    PT Comments    Progressing with mobility. Moderate pain during session. Pt seemed to have some short term memory issues during this session-required repeated cueing for mobility tasks. Instructed pt to speak with RN about pain meds before 2nd session. Will plan to have a 2nd session prior to possible d/c home later today if pt does well.     Recommendations for follow up therapy are one component of a multi-disciplinary discharge planning process, led by the attending physician.  Recommendations may be updated based on patient status, additional functional criteria and insurance authorization.  Follow Up Recommendations  Follow surgeon's recommendation for DC plan and follow-up therapies;Supervision for mobility/OOB (could benefit from HHPT f/u)     Equipment Recommendations       Recommendations for Other Services       Precautions / Restrictions Precautions Precautions: Fall Restrictions Weight Bearing Restrictions: No Other Position/Activity Restrictions: WBAT     Mobility  Bed Mobility               General bed mobility comments: oob in recliner    Transfers Overall transfer level: Needs assistance Equipment used: Rolling walker (2 wheeled) Transfers: Sit to/from Stand Sit to Stand: Min guard         General transfer comment: Min guard for safety. Increased time. Cues provided.  Ambulation/Gait Ambulation/Gait assistance: Min guard Gait Distance (Feet): 65 Feet Assistive device: Rolling walker (2 wheeled) Gait Pattern/deviations: Step-to pattern;Decreased step length - right;Decreased stride length;Step-through pattern     General Gait Details: Gait initially began with pt scooting R foot during  swing phase. As distance increased, pt was able to take short steps on R side. Slow gait speed. Pt denied dizziness.   Stairs  Min Assist Step to pattern;Forwards;1 rail + cane  Up and over portable stairs x 2. Cues for safety, technique, sequence. Pt had difficulty recalling proper sequencing.          Wheelchair Mobility    Modified Rankin (Stroke Patients Only)       Balance Overall balance assessment: Needs assistance         Standing balance support: Bilateral upper extremity supported Standing balance-Leahy Scale: Poor                              Cognition Arousal/Alertness: Awake/alert Behavior During Therapy: WFL for tasks assessed/performed Overall Cognitive Status: No family/caregiver present to determine baseline cognitive functioning                                 General Comments: pt seems to have some short term memory deficits-? Repeated cueing required during session. Repeated questions from patient as well.      Exercises Total Joint Exercises Ankle Circles/Pumps: AROM;Both;10 reps Quad Sets: AROM;Both;10 reps Heel Slides: AAROM;Right;10 reps Hip ABduction/ADduction: AROM;Right;5 reps;Standing Long Arc Quad: AROM;Right;5 reps;Seated Knee Flexion: AROM;Right;5 reps;Standing Marching in Standing: AROM;Both;5 reps;Standing    General Comments        Pertinent Vitals/Pain Pain Assessment: 0-10 Pain Score: 6  Pain Location: R hip/thigh Pain Descriptors / Indicators: Discomfort;Sore;Grimacing Pain Intervention(s): Limited activity within patient's tolerance;Monitored during session;Ice applied;Repositioned  Home Living                      Prior Function            PT Goals (current goals can now be found in the care plan section) Progress towards PT goals: Progressing toward goals    Frequency    7X/week      PT Plan Current plan remains appropriate    Co-evaluation               AM-PAC PT "6 Clicks" Mobility   Outcome Measure  Help needed turning from your back to your side while in a flat bed without using bedrails?: A Little Help needed moving from lying on your back to sitting on the side of a flat bed without using bedrails?: A Little Help needed moving to and from a bed to a chair (including a wheelchair)?: A Little Help needed standing up from a chair using your arms (e.g., wheelchair or bedside chair)?: A Little Help needed to walk in hospital room?: A Little Help needed climbing 3-5 steps with a railing? : A Little 6 Click Score: 18    End of Session Equipment Utilized During Treatment: Gait belt Activity Tolerance: Patient tolerated treatment well Patient left: in chair;with call bell/phone within reach;with chair alarm set   PT Visit Diagnosis: Pain;Other abnormalities of gait and mobility (R26.89) Pain - Right/Left: Right Pain - part of body: Hip     Time: 1130-1200 PT Time Calculation (min) (ACUTE ONLY): 30 min  Charges:  $Gait Training: 8-22 mins $Therapeutic Exercise: 8-22 mins                        Faye Ramsay, PT Acute Rehabilitation  Office: [phone] Pager: [phone]

## 2020-10-18 NOTE — Progress Notes (Signed)
Physical Therapy Treatment Patient Details Name: Marissa Dodson MRN: [id number] DOB: 08/11/1940 Today's Date: 10/18/2020   History of Present Illness Patient is 80 y.o. female s/p Rt THA anterior approach on 10/17/20 with PMH significant for GERD, HTN.    PT Comments    Pt continues to participate well. She still seems to have some "brain fog"-? From meds?.Reviewed gait and stair training again this afternoon. Issued HEP for pt to perform 2x/day. All education completed.     Recommendations for follow up therapy are one component of a multi-disciplinary discharge planning process, led by the attending physician.  Recommendations may be updated based on patient status, additional functional criteria and insurance authorization.  Follow Up Recommendations  Follow surgeon's recommendation for DC plan and follow-up therapies;Supervision/Assistance - 24 hour (could benefit from HHPT)     Equipment Recommendations       Recommendations for Other Services       Precautions / Restrictions Precautions Precautions: Fall Restrictions Weight Bearing Restrictions: No Other Position/Activity Restrictions: WBAT     Mobility  Bed Mobility Overal bed mobility: Needs Assistance Bed Mobility: Supine to Sit;Sit to Supine     Supine to sit: Min guard Sit to supine: Min assist   General bed mobility comments: Min guard for supine>sit. Min A for sit to supine. A for R LE onto bed. Increased time. Cues provided.    Transfers Overall transfer level: Needs assistance Equipment used: Rolling walker (2 wheeled) Transfers: Sit to/from Stand Sit to Stand: Min guard         General transfer comment: Min guard for safety. Increased time. Cues provided.  Ambulation/Gait Ambulation/Gait assistance: Min guard Gait Distance (Feet): 65 Feet Assistive device: Rolling walker (2 wheeled) Gait Pattern/deviations: Step-to pattern;Decreased step length - right;Decreased stride length;Step-through  pattern     General Gait Details: Gait initially began with pt scooting R foot during swing phase. As distance increased, step length improved.  Slow gait speed. Pt denied dizziness.   Stairs Stairs: Yes Stairs assistance: Min assist Stair Management: Forwards;One rail Left;Step to pattern;With cane Number of Stairs: 2 General stair comments: Up and over portable stairs x 1. Cues for safety, technique, sequence. Assist to steady intermittently and to manage DME. Practiced with 1 rail + cane   Wheelchair Mobility    Modified Rankin (Stroke Patients Only)       Balance Overall balance assessment: Needs assistance         Standing balance support: Bilateral upper extremity supported Standing balance-Leahy Scale: Poor                              Cognition Arousal/Alertness: Awake/alert Behavior During Therapy: WFL for tasks assessed/performed Overall Cognitive Status: No family/caregiver present to determine baseline cognitive functioning                                 General Comments: pt seems to have some short term memory deficits-? medicine related?. Repeated cueing required during session.      Exercises     General Comments        Pertinent Vitals/Pain Pain Assessment: 0-10 Pain Score: 6  Pain Location: R hip/thigh Pain Descriptors / Indicators: Discomfort;Sore;Grimacing Pain Intervention(s): Limited activity within patient's tolerance;Monitored during session;Repositioned    Home Living  Prior Function            PT Goals (current goals can now be found in the care plan section) Progress towards PT goals: Progressing toward goals    Frequency    7X/week      PT Plan Current plan remains appropriate    Co-evaluation              AM-PAC PT "6 Clicks" Mobility   Outcome Measure  Help needed turning from your back to your side while in a flat bed without using bedrails?: A  Little Help needed moving from lying on your back to sitting on the side of a flat bed without using bedrails?: A Little Help needed moving to and from a bed to a chair (including a wheelchair)?: A Little Help needed standing up from a chair using your arms (e.g., wheelchair or bedside chair)?: A Little Help needed to walk in hospital room?: A Little Help needed climbing 3-5 steps with a railing? : A Little 6 Click Score: 18    End of Session Equipment Utilized During Treatment: Gait belt Activity Tolerance: Patient tolerated treatment well Patient left: in bed;with call bell/phone within reach   PT Visit Diagnosis: Pain;Other abnormalities of gait and mobility (R26.89) Pain - Right/Left: Right Pain - part of body: Hip     Time: 1420-1444 PT Time Calculation (min) (ACUTE ONLY): 24 min  Charges:  $Gait Training: 23-37 mins $Therapeutic Exercise: 8-22 mins                         Faye Ramsay, PT Acute Rehabilitation  Office: [phone] Pager: [phone]

## 2020-10-18 NOTE — TOC Transition Note (Signed)
Transition of Care [facility]) - CM/SW Discharge Note   Patient Details  Name: Marissa Dodson MRN: [id number] Date of Birth: 04-21-1940  Transition of Care [facility]) CM/SW Contact:  Lennart Pall, LCSW Phone Number: 10/18/2020, 9:26 AM   Clinical Narrative:    Met with pt and confirming need for rw - [employer] to provide prior to dc.  Plan for HEP.  No further TOC needs.   Final next level of care: Home/Self Care Barriers to Discharge: No Barriers Identified   Patient Goals and CMS Choice Patient states their goals for this hospitalization and ongoing recovery are:: return home today      Discharge Placement                       Discharge Plan and Services                DME Arranged: Walker rolling DME Agency: [place]                  Social Determinants of Health (SDOH) Interventions     Readmission Risk Interventions No flowsheet data found.

## 2020-10-22 ENCOUNTER — Encounter (HOSPITAL_COMMUNITY): Payer: Self-pay | Admitting: Orthopedic Surgery

## 2020-10-26 NOTE — Discharge Summary (Signed)
Physician Discharge Summary   Patient ID: Marissa Dodson MRN: [id number] DOB/AGE: 05-29-1940 80 y.o.  Admit date: 10/17/2020 Discharge date: 10/18/2020  Primary Diagnosis: Right  hip osteoarthritis.   Admission Diagnoses:  Past Medical History:  Diagnosis Date   Acute meniscal injury of left knee    Complication of anesthesia    hypotention   Constipation    Dyspnea    spring needs inhaler   GERD (gastroesophageal reflux disease)    Hypertension    Hypoglycemia, unspecified    IC (interstitial cystitis)    Memory difficulties 07/18/2016   Sleep apnea    STOP-Bang =  5   Swelling of left knee joint    Discharge Diagnoses:   Active Problems:   S/P right total hip arthroplasty  Estimated body mass index is 41.63 kg/m as calculated from the following:   Height as of this encounter: 5\' 3"  (1.6 m).   Weight as of this encounter: 106.6 kg.  Procedure:  Procedure(s) (LRB): TOTAL HIP ARTHROPLASTY ANTERIOR APPROACH (Right)   Consults: None  HPI:  Marissa Dodson is a 80 y.o. female who had   presented to office for evaluation of right hip pain.  Radiographs revealed   progressive degenerative changes with bone-on-bone   articulation of the  hip joint, including subchondral cystic changes and osteophytes.  The patient had painful limited range of   motion significantly affecting their overall quality of life and function.  The patient was failing to    respond to conservative measures including medications and/or injections and activity modification and at this point was ready   to proceed with more definitive measures.  Consent was obtained for   benefit of pain relief.  Specific risks of infection, DVT, component   failure, dislocation, neurovascular injury, and need for revision surgery were reviewed in the office as well discussion of   the anterior versus posterior approach were reviewed.  Laboratory Data: Admission on 10/17/2020, Discharged on 10/18/2020   Component Date Value Ref Range Status   ABO/RH(D) 10/17/2020    Final                   Value:A POS Performed at [facility], [address], [place], [place]    WBC 10/18/2020 15.7 (A) 4.0 - 10.5 K/uL Final   RBC 10/18/2020 4.29  3.87 - 5.11 MIL/uL Final   Hemoglobin 10/18/2020 12.3  12.0 - 15.0 g/dL Final   HCT 10/20/2020 37.8  36.0 - 46.0 % Final   MCV 10/18/2020 88.1  80.0 - 100.0 fL Final   MCH 10/18/2020 28.7  26.0 - 34.0 pg Final   MCHC 10/18/2020 32.5  30.0 - 36.0 g/dL Final   RDW 10/20/2020 12.3  11.5 - 15.5 % Final   Platelets 10/18/2020 289  150 - 400 K/uL Final   nRBC 10/18/2020 0.0  0.0 - 0.2 % Final   Performed at [facility], [address], [place], [place]   Sodium 10/18/2020 135  135 - 145 mmol/L Final   Potassium 10/18/2020 5.1  3.5 - 5.1 mmol/L Final   Chloride 10/18/2020 101  98 - 111 mmol/L Final   CO2 10/18/2020 27  22 - 32 mmol/L Final   Glucose, Bld 10/18/2020 130 (A) 70 - 99 mg/dL Final   Glucose reference range applies only to samples taken after fasting for at least 8 hours.   BUN 10/18/2020 12  8 - 23 mg/dL Final  Creatinine, Ser 10/18/2020 0.82  0.44 - 1.00 mg/dL Final   Calcium 48/54/6270 8.9  8.9 - 10.3 mg/dL Final   GFR, Estimated 10/18/2020 >60  >60 mL/min Final   Comment: (NOTE) Calculated using the CKD-EPI Creatinine Equation (2021)    Anion gap 10/18/2020 7  5 - 15 Final   Performed at [facility], [address] [place], [place] [address]  Orders Only on 10/13/2020  Component Date Value Ref Range Status   SARS Coronavirus 2 10/13/2020 RESULT: NEGATIVE   Final   Comment: RESULT: NEGATIVESARS-CoV-2 INTERPRETATION:A NEGATIVE  test result means that SARS-CoV-2 RNA was not present in the specimen above the limit of detection of this test. This does not preclude a possible SARS-CoV-2 infection and should not be used as the  sole basis for patient management decisions.  Negative results must be combined with clinical observations, patient history, and epidemiological information. Optimum specimen types and timing for peak viral levels during infections caused by SARS-CoV-2  have not been determined. Collection of multiple specimens or types of specimens may be necessary to detect virus. Improper specimen collection and handling, sequence variability under primers/probes, or organism present below the limit of detection may  lead to false negative results. Positive and negative predictive values of testing are highly dependent on prevalence. False negative test results are more likely when prevalence of disease is high.The expected result is NEGATIVE.Fact S                          heet for  Healthcare Providers: [employer] Sheet for Patients: https://poole-freeman.org/ Reference Range - Negative   Hospital Outpatient Visit on 10/09/2020  Component Date Value Ref Range Status   WBC 10/09/2020 9.8  4.0 - 10.5 K/uL Final   RBC 10/09/2020 4.67  3.87 - 5.11 MIL/uL Final   Hemoglobin 10/09/2020 13.3  12.0 - 15.0 g/dL Final   HCT 38/18/2993 42.2  36.0 - 46.0 % Final   MCV 10/09/2020 90.4  80.0 - 100.0 fL Final   MCH 10/09/2020 28.5  26.0 - 34.0 pg Final   MCHC 10/09/2020 31.5  30.0 - 36.0 g/dL Final   RDW 71/69/6789 12.5  11.5 - 15.5 % Final   Platelets 10/09/2020 367  150 - 400 K/uL Final   nRBC 10/09/2020 0.0  0.0 - 0.2 % Final   Performed at [facility], [address], [place], [place] [address]   Sodium 10/09/2020 144  135 - 145 mmol/L Final   Potassium 10/09/2020 5.1  3.5 - 5.1 mmol/L Final   Chloride 10/09/2020 106  98 - 111 mmol/L Final   CO2 10/09/2020 28  22 - 32 mmol/L Final   Glucose, Bld 10/09/2020 96  70 - 99 mg/dL Final   Glucose reference range applies only to samples taken after fasting for at least 8 hours.   BUN 10/09/2020 14  8 - 23 mg/dL Final   Creatinine, Ser  10/09/2020 1.09 (A) 0.44 - 1.00 mg/dL Final   Calcium 75/10/2583 10.2  8.9 - 10.3 mg/dL Final   Total Protein 27/78/2423 7.5  6.5 - 8.1 g/dL Final   Albumin 53/61/4431 4.3  3.5 - 5.0 g/dL Final   AST 54/00/8676 15  15 - 41 U/L Final   ALT 10/09/2020 9  0 - 44 U/L Final   Alkaline Phosphatase 10/09/2020 79  38 - 126 U/L Final   Total Bilirubin 10/09/2020 0.3  0.3 - 1.2 mg/dL Final  GFR, Estimated 10/09/2020 52 (A) >60 mL/min Final   Comment: (NOTE) Calculated using the CKD-EPI Creatinine Equation (2021)    Anion gap 10/09/2020 10  5 - 15 Final   Performed at [facility], [address] [place], [place] [address]   ABO/RH(D) 10/09/2020 A POS   Final   Antibody Screen 10/09/2020 NEG   Final   Sample Expiration 10/09/2020 10/20/2020,2359   Final   Extend sample reason 10/09/2020    Final                   Value:NO TRANSFUSIONS OR PREGNANCY IN THE PAST 3 MONTHS Performed at [facility], [address], [place], [place] [address]    MRSA, PCR 10/09/2020 NEGATIVE  NEGATIVE Final   Staphylococcus aureus 10/09/2020 NEGATIVE  NEGATIVE Final   Comment: (NOTE) The Xpert SA Assay (FDA approved for NASAL specimens in patients 29 years of age and older), is one component of a comprehensive surveillance program. It is not intended to diagnose infection nor to guide or monitor treatment. Performed at [facility], [address] [place], [place] [address]      X-Rays:DG Pelvis Portable  Result Date: 10/17/2020 CLINICAL DATA:  Status post hip arthroplasty. EXAM: PORTABLE PELVIS 1-2 VIEWS COMPARISON:  None. FINDINGS: Single view of the pelvis demonstrates a right total hip arthroplasty. The right hip appears located on this single view. Expected postoperative changes in the right thigh soft tissues. Pelvic bony ring is intact. IMPRESSION: Right hip arthroplasty without complicating features. Electronically Signed   By: Richarda Overlie M.D.    On: 10/17/2020 09:35   DG C-Arm 1-60 Min-No Report  Result Date: 10/17/2020 Fluoroscopy was utilized by the requesting physician.  No radiographic interpretation.   DG HIP OPERATIVE UNILAT W OR W/O PELVIS RIGHT  Result Date: 10/17/2020 CLINICAL DATA:  80 year old female undergoing hip replacement. EXAM: OPERATIVE choose 2 HIP (WITH PELVIS IF PERFORMED) 2 VIEWS TECHNIQUE: Fluoroscopic spot image(s) were submitted for interpretation post-operatively. COMPARISON:  Report of CT Abdomen and Pelvis 08/14/2012 (no images available). FINDINGS: 2 intraoperative AP fluoroscopic spot views of the right hip and lower pelvis demonstrate right total hip arthroplasty hardware in place. Femoral head component not yet in place. No adverse features identified. IMPRESSION: Right total hip arthroplasty in progress with no adverse features. Electronically Signed   By: Odessa Fleming M.D.   On: 10/17/2020 08:45    EKG: Orders placed or performed in visit on 10/03/20   EKG 12-Lead     Hospital Course: Marissa Dodson is a 80 y.o. who was admitted to [facility]. They were brought to the operating room on 10/17/2020 and underwent Procedure(s): TOTAL HIP ARTHROPLASTY ANTERIOR APPROACH.  Patient tolerated the procedure well and was later transferred to the recovery room and then to the orthopaedic floor for postoperative care. They were given PO and IV analgesics for pain control following their surgery. They were given 24 hours of postoperative antibiotics of  Anti-infectives (From admission, onward)    Start     Dose/Rate Route Frequency Ordered Stop   10/17/20 1300  ceFAZolin (ANCEF) IVPB 2g/100 mL premix        2 g 200 mL/hr over 30 Minutes Intravenous Every 6 hours 10/17/20 1005 10/17/20 1852   10/17/20 0600  ceFAZolin (ANCEF) IVPB 2g/100 mL premix        2 g 200 mL/hr over 30 Minutes Intravenous On call to O.R. 10/17/20 0530 10/17/20 2951  and started on DVT prophylaxis in the form of Aspirin.   PT  and OT were ordered for total joint protocol. Discharge planning consulted to help with postop disposition and equipment needs.  Patient had a good night on the evening of surgery. They started to get up OOB with therapy on POD #-0. Pt was seen during rounds and was ready to go home pending progress with therapy.She worked with therapy on POD #1 and was meeting her goals. Pt was discharged to home later that day in stable condition.  Diet: Regular diet Activity: WBAT Follow-up: in 2 weeks Disposition: Home Discharged Condition: good   Discharge Instructions     Call MD / Call 911   Complete by: As directed    If you experience chest pain or shortness of breath, CALL 911 and be transported to the hospital emergency room.  If you develope a fever above 101 F, pus (white drainage) or increased drainage or redness at the wound, or calf pain, call your surgeon's office.   Change dressing   Complete by: As directed    Maintain surgical dressing until follow up in the clinic. If the edges start to pull up, may reinforce with tape. If the dressing is no longer working, may remove and cover with gauze and tape, but must keep the area dry and clean.  Call with any questions or concerns.   Constipation Prevention   Complete by: As directed    Drink plenty of fluids.  Prune juice may be helpful.  You may use a stool softener, such as Colace (over the counter) 100 mg twice a day.  Use MiraLax (over the counter) for constipation as needed.   Diet - low sodium heart healthy   Complete by: As directed    Increase activity slowly as tolerated   Complete by: As directed    Weight bearing as tolerated with assist device (walker, cane, etc) as directed, use it as long as suggested by your surgeon or therapist, typically at least 4-6 weeks.   Post-operative opioid taper instructions:   Complete by: As directed    POST-OPERATIVE OPIOID TAPER INSTRUCTIONS: It is important to wean off of your opioid medication  as soon as possible. If you do not need pain medication after your surgery it is ok to stop day one. Opioids include: Codeine, Hydrocodone(Norco, Vicodin), Oxycodone(Percocet, oxycontin) and hydromorphone amongst others.  Long term and even short term use of opiods can cause: Increased pain response Dependence Constipation Depression Respiratory depression And more.  Withdrawal symptoms can include Flu like symptoms Nausea, vomiting And more Techniques to manage these symptoms Hydrate well Eat regular healthy meals Stay active Use relaxation techniques(deep breathing, meditating, yoga) Do Not substitute Alcohol to help with tapering If you have been on opioids for less than two weeks and do not have pain than it is ok to stop all together.  Plan to wean off of opioids This plan should start within one week post op of your joint replacement. Maintain the same interval or time between taking each dose and first decrease the dose.  Cut the total daily intake of opioids by one tablet each day Next start to increase the time between doses. The last dose that should be eliminated is the evening dose.      TED hose   Complete by: As directed    Use stockings (TED hose) for 2 weeks on both leg(s).  You may remove them at night for sleeping.  Allergies as of 10/18/2020       Reactions   Ketek [telithromycin] Shortness Of Breath, Other (See Comments)   Metoprolol Other (See Comments)   "Lopressor" Hands hurt   Cephalosporins Swelling, Other (See Comments)   Urinary retention   Codeine Itching   Durezol [difluprednate]    Redness in eyes   Flagyl [metronidazole] Swelling   Levofloxacin    Headache, edema   Maxzide [hydrochlorothiazide W-triamterene] Swelling   Low urine output   Meloxicam    Edema, low urine output   Prednisone Swelling   Low urine output    Vistaril [hydroxyzine]    Short term memory loss        Medication List     STOP taking these  medications    acetaminophen 500 MG tablet Commonly known as: TYLENOL   diclofenac Sodium 1 % Gel Commonly known as: VOLTAREN   ibuprofen 600 MG tablet Commonly known as: ADVIL   traMADol 50 MG tablet Commonly known as: ULTRAM       TAKE these medications    albuterol 108 (90 Base) MCG/ACT inhaler Commonly known as: VENTOLIN HFA Inhale 2 puffs into the lungs every 6 (six) hours as needed for wheezing or shortness of breath.   amitriptyline 50 MG tablet Commonly known as: ELAVIL Take 50 mg by mouth at bedtime.   aspirin 81 MG chewable tablet Chew 1 tablet (81 mg total) by mouth 2 (two) times daily for 28 days. What changed: when to take this   atenolol 50 MG tablet Commonly known as: TENORMIN Take 50 mg by mouth daily.   cetirizine 10 MG tablet Commonly known as: ZYRTEC Take 10 mg by mouth daily.   docusate sodium 100 MG capsule Commonly known as: COLACE Take 1 capsule (100 mg total) by mouth 2 (two) times daily.   fluticasone 50 MCG/ACT nasal spray Commonly known as: FLONASE Place 2 sprays into both nostrils at bedtime.   furosemide 40 MG tablet Commonly known as: LASIX Take 20 mg by mouth See admin instructions. Take 20 mg daily, may take a second 20 mg dose as needed for swelling   HYDROcodone-acetaminophen 5-325 MG tablet Commonly known as: NORCO/VICODIN Take 1-2 tablets by mouth every 6 (six) hours as needed for severe pain (pain score 7-10).   LUBRICATING EYE DROPS OP Place 1 drop into both eyes daily as needed (dry eyes).   methocarbamol 500 MG tablet Commonly known as: ROBAXIN Take 1 tablet (500 mg total) by mouth every 6 (six) hours as needed for muscle spasms.   omeprazole 20 MG capsule Commonly known as: PRILOSEC Take 20 mg by mouth 2 (two) times daily.   polyethylene glycol powder 17 GM/SCOOP powder Commonly known as: GLYCOLAX/MIRALAX Take 17 g by mouth daily.   potassium chloride SA 20 MEQ tablet Commonly known as: KLOR-CON Take 20  mEq by mouth daily.   Vitamin D3 75 MCG (3000 UT) Tabs Take 3,000 Units by mouth daily.               Discharge Care Instructions  (From admission, onward)           Start     Ordered   10/18/20 0000  Change dressing       Comments: Maintain surgical dressing until follow up in the clinic. If the edges start to pull up, may reinforce with tape. If the dressing is no longer working, may remove and cover with gauze and tape, but must keep the area dry and  clean.  Call with any questions or concerns.   10/18/20 9509            Follow-up Information     Durene Romans, MD. Schedule an appointment as soon as possible for a visit in 2 week(s).   Specialty: Orthopedic Surgery Contact information: [address] [place] [address] [phone]                 Signed: Dennie Bible, PA-C Orthopedic Surgery 10/26/2020, 7:42 AM

## 2021-01-17 ENCOUNTER — Other Ambulatory Visit: Payer: Self-pay

## 2021-01-17 ENCOUNTER — Other Ambulatory Visit (HOSPITAL_BASED_OUTPATIENT_CLINIC_OR_DEPARTMENT_OTHER): Payer: Self-pay

## 2021-01-17 ENCOUNTER — Emergency Department (HOSPITAL_BASED_OUTPATIENT_CLINIC_OR_DEPARTMENT_OTHER)
Admission: EM | Admit: 2021-01-17 | Discharge: 2021-01-17 | Disposition: A | Discharge status: Home / Self Care | Payer: Medicare HMO | Attending: Emergency Medicine | Admitting: Emergency Medicine

## 2021-01-17 ENCOUNTER — Encounter (HOSPITAL_BASED_OUTPATIENT_CLINIC_OR_DEPARTMENT_OTHER): Payer: Self-pay

## 2021-01-17 ENCOUNTER — Emergency Department (HOSPITAL_BASED_OUTPATIENT_CLINIC_OR_DEPARTMENT_OTHER): Payer: Medicare HMO

## 2021-01-17 DIAGNOSIS — M25512 Pain in left shoulder: Secondary | ICD-10-CM

## 2021-01-17 DIAGNOSIS — M542 Cervicalgia: Secondary | ICD-10-CM

## 2021-01-17 DIAGNOSIS — I10 Essential (primary) hypertension: Secondary | ICD-10-CM | POA: Diagnosis not present

## 2021-01-17 DIAGNOSIS — R0789 Other chest pain: Secondary | ICD-10-CM | POA: Diagnosis not present

## 2021-01-17 DIAGNOSIS — Z87891 Personal history of nicotine dependence: Secondary | ICD-10-CM | POA: Diagnosis not present

## 2021-01-17 DIAGNOSIS — Z96641 Presence of right artificial hip joint: Secondary | ICD-10-CM | POA: Diagnosis not present

## 2021-01-17 LAB — HEPATIC FUNCTION PANEL
ALT: 7 U/L (ref 0–44)
AST: 12 U/L — ABNORMAL LOW (ref 15–41)
Albumin: 4.3 g/dL (ref 3.5–5.0)
Alkaline Phosphatase: 92 U/L (ref 38–126)
Bilirubin, Direct: <0.1 mg/dL (ref 0.0–0.2)
Total Bilirubin: 0.3 mg/dL (ref 0.3–1.2)
Total Protein: 8 g/dL (ref 6.5–8.1)

## 2021-01-17 LAB — BASIC METABOLIC PANEL
Anion gap: 8 (ref 5–15)
BUN: 10 mg/dL (ref 8–23)
CO2: 30 mmol/L (ref 22–32)
Calcium: 9.2 mg/dL (ref 8.9–10.3)
Chloride: 98 mmol/L (ref 98–111)
Creatinine, Ser: 0.85 mg/dL (ref 0.44–1.00)
GFR, Estimated: >60 mL/min (ref >60)
Glucose, Bld: 86 mg/dL (ref 70–99)
Potassium: 3.8 mmol/L (ref 3.5–5.1)
Sodium: 136 mmol/L (ref 135–145)

## 2021-01-17 LAB — CBC WITH DIFFERENTIAL/PLATELET
Abs Immature Granulocytes: 0.03 10*3/uL (ref 0.00–0.07)
Basophils Absolute: 0.1 10*3/uL (ref 0.0–0.1)
Basophils Relative: 1 %
Eosinophils Absolute: 0.1 10*3/uL (ref 0.0–0.5)
Eosinophils Relative: 1 %
HCT: 41.1 % (ref 36.0–46.0)
Hemoglobin: 13.1 g/dL (ref 12.0–15.0)
Immature Granulocytes: 0 %
Lymphocytes Relative: 30 %
Lymphs Abs: 2.2 10*3/uL (ref 0.7–4.0)
MCH: 27.2 pg (ref 26.0–34.0)
MCHC: 31.9 g/dL (ref 30.0–36.0)
MCV: 85.3 fL (ref 80.0–100.0)
Monocytes Absolute: 0.4 10*3/uL (ref 0.1–1.0)
Monocytes Relative: 6 %
Neutro Abs: 4.7 10*3/uL (ref 1.7–7.7)
Neutrophils Relative %: 62 %
Platelets: 427 10*3/uL — ABNORMAL HIGH (ref 150–400)
RBC: 4.82 MIL/uL (ref 3.87–5.11)
RDW: 13.2 % (ref 11.5–15.5)
WBC: 7.5 10*3/uL (ref 4.0–10.5)
nRBC: 0 % (ref 0.0–0.2)

## 2021-01-17 LAB — TROPONIN I (HIGH SENSITIVITY): Troponin I (High Sensitivity): 2 ng/L (ref <18)

## 2021-01-17 MED ORDER — ACETAMINOPHEN 325 MG PO TABS
650.0000 mg | ORAL_TABLET | Freq: Once | ORAL | Status: AC
  Administered 2021-01-17: 12:00:00 650 mg via ORAL

## 2021-01-17 MED ORDER — METHYLPREDNISOLONE 4 MG PO TBPK: ORAL_TABLET | ORAL | 0 refills | Status: DC

## 2021-01-17 MED ORDER — METHYLPREDNISOLONE 4 MG PO TBPK
ORAL_TABLET | ORAL | 0 refills | Status: DC
  Filled 2021-01-17 (×2): qty 21, 6d supply, fill #0

## 2021-01-17 NOTE — Discharge Instructions (Signed)
Take Medrol Dosepak as prescribed to help with your pain.  Recommend taking your muscle relaxant.  Recommend 1000 mg of Tylenol every 6 hours as needed for pain as well.  Follow-up with your primary care doctor.

## 2021-01-17 NOTE — ED Provider Notes (Signed)
[facility] EMERGENCY DEPARTMENT Provider Note   CSN: [id number] Arrival date & time: 01/17/21  1134     History Chief Complaint  Patient presents with   Chest Pain    Marissa Dodson is a 80 y.o. female.  The history is provided by the patient.  Chest Pain Pain location:  L chest Pain quality: aching   Pain radiates to:  Neck, L shoulder and L arm Pain severity:  Mild Onset quality:  Gradual Duration:  1 day Timing:  Constant Progression:  Unchanged Chronicity:  New Context: raising an arm   Relieved by:  Nothing Worsened by:  Nothing Associated symptoms: numbness (tingling of her left arm)   Associated symptoms: no abdominal pain, no anorexia, no anxiety, no back pain, no claudication, no cough, no diaphoresis, no dizziness, no dysphagia, no fatigue, no fever, no headache, no nausea, no near-syncope, no palpitations, no shortness of breath, no syncope, no vomiting and no weakness   Risk factors: no coronary artery disease, no diabetes mellitus, no high cholesterol, no hypertension, no prior DVT/PE and no smoking       Past Medical History:  Diagnosis Date   Acute meniscal injury of left knee    Complication of anesthesia    hypotention   Constipation    Dyspnea    spring needs inhaler   GERD (gastroesophageal reflux disease)    Hypertension    Hypoglycemia, unspecified    IC (interstitial cystitis)    Memory difficulties 07/18/2016   Sleep apnea    STOP-Bang =  5   Swelling of left knee joint     Patient Active Problem List   Diagnosis Date Noted   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   Angioedema 08/16/2019   Chronic rhinitis 08/16/2019   Intolerance, food 08/16/2019   Allergic reaction 08/16/2019   Memory difficulties 07/18/2016   Acute medial meniscal tear 10/08/2012    Past Surgical History:  Procedure Laterality Date   HEMORROIDECTOMY  1970's   KNEE ARTHROSCOPY Right 2009   KNEE ARTHROSCOPY Left 10/09/2012   Procedure:  LEFT ARTHROSCOPY KNEE WITH medial meiscal DEBRIDEMENT;  Surgeon: Gearlean Alf, MD;  Location: [facility];  Service: Orthopedics;  Laterality: Left;   LAPAROSCOPIC LYSIS OF ADHESIONS  1980'S   TONSILLECTOMY AND ADENOIDECTOMY  AGE 11   TOTAL ABDOMINAL HYSTERECTOMY W/ BILATERAL SALPINGOOPHORECTOMY  AGE 15   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   Procedure: TOTAL HIP ARTHROPLASTY ANTERIOR APPROACH;  Surgeon: Paralee Cancel, MD;  Location: [facility];  Service: Orthopedics;  Laterality: Right;     OB History   No obstetric history on file.     Family History  Problem Relation Age of Onset   Heart disease Mother    Alzheimer's disease Mother    Prostate cancer Father    Diabetes Sister    Diabetes Brother    Liver disease Brother    Asthma Grandson    Allergic rhinitis Grandson    Eczema Grandson    Allergic rhinitis Daughter    Allergic rhinitis Daughter    Allergic rhinitis Son    Food Allergy Granddaughter    Immunodeficiency Neg Hx    Urticaria Neg Hx    Angioedema Neg Hx    Atopy Neg Hx     Social History   Tobacco Use   Smoking status: Former    Packs/day: 0.50    Years: 20.00    Pack years: 10.00    Types: Cigarettes  Quit date: 10/02/1992    Years since quitting: 28.3   Smokeless tobacco: Never  Vaping Use   Vaping Use: Never used  Substance Use Topics   Alcohol use: No   Drug use: No    Home Medications Prior to Admission medications   Medication Sig Start Date End Date Taking? Authorizing Provider  albuterol (VENTOLIN HFA) 108 (90 Base) MCG/ACT inhaler Inhale 2 puffs into the lungs every 6 (six) hours as needed for wheezing or shortness of breath.    [provider]  amitriptyline (ELAVIL) 50 MG tablet Take 50 mg by mouth at bedtime.    [provider]  atenolol (TENORMIN) 50 MG tablet Take 50 mg by mouth daily.    [provider]  Carboxymethylcellul-Glycerin (LUBRICATING EYE DROPS OP) Place 1 drop into both  eyes daily as needed (dry eyes).    [provider]  cetirizine (ZYRTEC) 10 MG tablet Take 10 mg by mouth daily.    [provider]  Cholecalciferol (VITAMIN D3) 75 MCG (3000 UT) TABS Take 3,000 Units by mouth daily.    [provider]  docusate sodium (COLACE) 100 MG capsule Take 1 capsule (100 mg total) by mouth 2 (two) times daily. 10/18/20   Irving Copas, PA-C  fluticasone (FLONASE) 50 MCG/ACT nasal spray Place 2 sprays into both nostrils at bedtime. 07/16/16   [provider]  furosemide (LASIX) 40 MG tablet Take 20 mg by mouth See admin instructions. Take 20 mg daily, may take a second 20 mg dose as needed for swelling    [provider]  HYDROcodone-acetaminophen (NORCO/VICODIN) 5-325 MG tablet Take 1-2 tablets by mouth every 6 (six) hours as needed for severe pain (pain score 7-10). 10/18/20   Irving Copas, PA-C  methocarbamol (ROBAXIN) 500 MG tablet Take 1 tablet (500 mg total) by mouth every 6 (six) hours as needed for muscle spasms. 10/18/20   Irving Copas, PA-C  methylPREDNISolone (MEDROL DOSEPAK) 4 MG TBPK tablet Follow package insert 01/17/21   Trase Bunda, DO  omeprazole (PRILOSEC) 20 MG capsule Take 20 mg by mouth 2 (two) times daily. 07/17/16   [provider]  polyethylene glycol powder (GLYCOLAX/MIRALAX) 17 GM/SCOOP powder Take 17 g by mouth daily. 08/30/20   [provider]  potassium chloride SA (K-DUR,KLOR-CON) 20 MEQ tablet Take 20 mEq by mouth daily.    [provider]    Allergies    Ketek [telithromycin], Metoprolol, Cephalosporins, Codeine, Durezol [difluprednate], Flagyl [metronidazole], Levofloxacin, Maxzide [hydrochlorothiazide w-triamterene], Meloxicam, Prednisone, and Vistaril [hydroxyzine]  Review of Systems   Review of Systems  Constitutional:  Negative for chills, diaphoresis, fatigue and fever.  HENT:  Negative for ear pain, sore throat and trouble swallowing.   Eyes:  Negative  for pain and visual disturbance.  Respiratory:  Negative for cough and shortness of breath.   Cardiovascular:  Positive for chest pain. Negative for palpitations, claudication, syncope and near-syncope.  Gastrointestinal:  Negative for abdominal pain, anorexia, nausea and vomiting.  Genitourinary:  Negative for dysuria and hematuria.  Musculoskeletal:  Positive for arthralgias. Negative for back pain.  Skin:  Negative for color change and rash.  Neurological:  Positive for numbness (tingling of her left arm). Negative for dizziness, seizures, syncope, facial asymmetry, speech difficulty, weakness, light-headedness and headaches.  All other systems reviewed and are negative.  Physical Exam Updated Vital Signs BP 119/66    Pulse (!) 55    Temp 98.2 F (36.8 C) (Oral)    Resp  16    Ht 5\' 3"  (1.6 m)    Wt 93.9 kg    SpO2 97%    BMI 36.67 kg/m   Physical Exam Vitals and nursing note reviewed.  Constitutional:      General: She is not in acute distress.    Appearance: She is well-developed.  HENT:     Head: Normocephalic and atraumatic.  Eyes:     Conjunctiva/sclera: Conjunctivae normal.  Cardiovascular:     Rate and Rhythm: Normal rate and regular rhythm.     Pulses:          Radial pulses are 2+ on the right side.     Heart sounds: Normal heart sounds. No murmur heard. Pulmonary:     Effort: Pulmonary effort is normal. No respiratory distress.     Breath sounds: Normal breath sounds. No decreased breath sounds or wheezing.  Chest:     Chest wall: Mass present.  Abdominal:     Palpations: Abdomen is soft.     Tenderness: There is no abdominal tenderness.  Musculoskeletal:        General: No swelling. Normal range of motion.     Cervical back: Neck supple.     Right lower leg: No edema.     Left lower leg: No edema.     Comments: Tenderness to left trapezius area, left paraspinal cervical muscles, left-sided chest wall  Skin:    General: Skin is warm and dry.     Capillary  Refill: Capillary refill takes less than 2 seconds.  Neurological:     General: No focal deficit present.     Mental Status: She is alert.     Cranial Nerves: No cranial nerve deficit.     Motor: No weakness.     Comments: Subjective decreased sensation to the ulnar portion of the left arm from the elbow down otherwise normal strength and sensation throughout  Psychiatric:        Mood and Affect: Mood normal.    ED Results / Procedures / Treatments   Labs (all labs ordered are listed, but only abnormal results are displayed) Labs Reviewed  CBC WITH DIFFERENTIAL/PLATELET - Abnormal; Notable for the following components:      Result Value   Platelets 427 (*)    All other components within normal limits  HEPATIC FUNCTION PANEL - Abnormal; Notable for the following components:   AST 12 (*)    All other components within normal limits  BASIC METABOLIC PANEL  TROPONIN I (HIGH SENSITIVITY)    EKG EKG Interpretation  Date/Time:  Wednesday January 17 2021 11:47:45 EST Ventricular Rate:  64 PR Interval:  182 QRS Duration: 82 QT Interval:  396 QTC Calculation: 408 R Axis:   -20 Text Interpretation: Normal sinus rhythm with sinus arrhythmia Confirmed by [id number] ([id number]) on 01/17/2021 11:50:13 AM  Radiology DG Chest Portable 1 View  Result Date: 01/17/2021 CLINICAL DATA:  Chest pain EXAM: PORTABLE CHEST 1 VIEW COMPARISON:  07/05/2016 FINDINGS: Cardiomegaly. Mild, diffuse bilateral interstitial pulmonary opacity. The visualized skeletal structures are unremarkable. IMPRESSION: Cardiomegaly with mild, diffuse bilateral interstitial pulmonary opacity, which may reflect edema. No focal airspace opacity. Electronically Signed   By: 09/04/2016 M.D.   On: 01/17/2021 12:22    Procedures Procedures   Medications Ordered in ED Medications  acetaminophen (TYLENOL) tablet 650 mg (650 mg Oral Given 01/17/21 1208)    ED Course  I have reviewed the triage vital signs and the nursing  notes.  Pertinent labs & imaging results that were available during my care of the patient were reviewed by me and considered in my medical decision making (see chart for details).    MDM Rules/Calculators/A&P                          ERICH FRUGE is here with left-sided neck pain, chest pain, left arm pain.  No major medical problems.  Pain ongoing for over a day.  Fairly constant.  Feels tingling down her left arm.  No midline spinal pain.  No trauma history.  She does appear to have some reproducible tenderness of the left side of her chest wall, left trapezius, left paraspinal cervical muscles.  She has normal strength and sensation throughout except for maybe some numbness feeling from the ulnar portion of her left elbow and down.  This appears to be muscular.  EKG has sinus rhythm.  No ischemic changes.  Will get troponin and basic labs to further rule out ACS.  Will get chest x-ray.  Denies any infectious symptoms.  Will give Tylenol and reevaluate.  Heart score is 3.  Troponin is normal.  No significant electrolyte abnormality, kidney injury, leukocytosis.  Chest x-ray overall unremarkable.  Some nonspecific possible diffuse bilateral opacity.  No concern for infectious process.  No concern for volume overload.  No respiratory distress.  Will prescribe Medrol Dosepak to go along with her muscle relaxant and Tylenol.  Discharged in good condition.  Recommend follow-up with primary care doctor.  Overall suspect muscular process.  This chart was dictated using voice recognition software.  Despite best efforts to proofread,  errors can occur which can change the documentation meaning.      Final Clinical Impression(s) / ED Diagnoses Final diagnoses:  Atypical chest pain  Acute pain of left shoulder  Neck pain    Rx / DC Orders ED Discharge Orders          Ordered    methylPREDNISolone (MEDROL DOSEPAK) 4 MG TBPK tablet  Status:  Discontinued        01/17/21 1302     methylPREDNISolone (MEDROL DOSEPAK) 4 MG TBPK tablet        01/17/21 1302             CuratoloQuita Skye, DO 01/17/21 1309

## 2021-01-17 NOTE — ED Triage Notes (Signed)
Pt c/o intermittent CP started last night-NAD-steady gait

## 2021-05-25 DIAGNOSIS — I700 Atherosclerosis of aorta: Secondary | ICD-10-CM | POA: Insufficient documentation

## 2021-05-25 DIAGNOSIS — M810 Age-related osteoporosis without current pathological fracture: Secondary | ICD-10-CM | POA: Insufficient documentation

## 2021-08-05 DIAGNOSIS — M7062 Trochanteric bursitis, left hip: Secondary | ICD-10-CM | POA: Insufficient documentation

## 2021-09-18 ENCOUNTER — Telehealth: Payer: Self-pay | Admitting: Interventional Cardiology

## 2021-09-18 NOTE — Telephone Encounter (Signed)
Called pt back in regards to SOB.  Reports had a sinus infection and crackles in left lung about 3 weeks ago.  Was placed on an antibiotic for 10 days and given an inhaler.  Antibiotic started on 08/31/21 has not had f/u since completion of antibiotic.  Advised pt to reach out to PCP to notify of completion and continued SOB.  Also reports overheated 1 day and has not felt well since.  Is in the process of moving in with daughter.  AC in her apartment is not working.  Also reports BP normally runs 130/70's and is now 120/60's.  Advised this is not a bad reading and is okay.   Asked pt to f/u with PCP and I will send message to Tereso Newcomer, PA to see if any testing is needed or if OV needs to be pushed up from 10/23/21.

## 2021-09-18 NOTE — Telephone Encounter (Signed)
Agree. She needs to follow up with primary care. Tereso Newcomer, PA-C    09/18/2021 4:30 PM

## 2021-09-18 NOTE — Telephone Encounter (Signed)
Pt c/o Shortness Of Breath: STAT if SOB developed within the last 24 hours or pt is noticeably SOB on the phone  1. Are you currently SOB (can you hear that pt is SOB on the phone)? No  2. How long have you been experiencing SOB? 2 weeks ago  3. Are you SOB when sitting or when up moving around? Moving around  4. Are you currently experiencing any other symptoms? Pt states that she can not tolerate going outside for too long. She states that when she goes outside she sweats profusely. More than normal and has a hard time catching her breath. She has made a 1 year f/u with Tereso Newcomer, PA on 09/26, but would like to make sure she should be seen sooner.

## 2021-09-18 NOTE — Telephone Encounter (Signed)
Called pt advised of providers recommendation to f/u with PCP.  Pt reports will call tomorrow and set up an OV.  Advised to call back if further assistance is needed.  No further concerns voiced.

## 2021-10-10 ENCOUNTER — Inpatient Hospital Stay (HOSPITAL_BASED_OUTPATIENT_CLINIC_OR_DEPARTMENT_OTHER)
Admission: EM | Admit: 2021-10-10 | Discharge: 2021-10-13 | DRG: 069 | Disposition: A | Discharge status: Home / Self Care | Payer: Medicare HMO | Attending: Family Medicine | Admitting: Family Medicine

## 2021-10-10 ENCOUNTER — Encounter (HOSPITAL_BASED_OUTPATIENT_CLINIC_OR_DEPARTMENT_OTHER): Payer: Self-pay

## 2021-10-10 ENCOUNTER — Other Ambulatory Visit: Payer: Self-pay

## 2021-10-10 ENCOUNTER — Emergency Department (HOSPITAL_BASED_OUTPATIENT_CLINIC_OR_DEPARTMENT_OTHER): Payer: Medicare HMO

## 2021-10-10 ENCOUNTER — Encounter (HOSPITAL_COMMUNITY): Payer: Self-pay

## 2021-10-10 DIAGNOSIS — Z885 Allergy status to narcotic agent status: Secondary | ICD-10-CM

## 2021-10-10 DIAGNOSIS — Z96641 Presence of right artificial hip joint: Secondary | ICD-10-CM | POA: Diagnosis present

## 2021-10-10 DIAGNOSIS — G9340 Encephalopathy, unspecified: Secondary | ICD-10-CM | POA: Diagnosis not present

## 2021-10-10 DIAGNOSIS — E119 Type 2 diabetes mellitus without complications: Secondary | ICD-10-CM | POA: Diagnosis not present

## 2021-10-10 DIAGNOSIS — K219 Gastro-esophageal reflux disease without esophagitis: Secondary | ICD-10-CM | POA: Diagnosis present

## 2021-10-10 DIAGNOSIS — Z825 Family history of asthma and other chronic lower respiratory diseases: Secondary | ICD-10-CM | POA: Diagnosis not present

## 2021-10-10 DIAGNOSIS — Z833 Family history of diabetes mellitus: Secondary | ICD-10-CM | POA: Diagnosis not present

## 2021-10-10 DIAGNOSIS — Z79899 Other long term (current) drug therapy: Secondary | ICD-10-CM | POA: Diagnosis not present

## 2021-10-10 DIAGNOSIS — E785 Hyperlipidemia, unspecified: Secondary | ICD-10-CM | POA: Diagnosis present

## 2021-10-10 DIAGNOSIS — G459 Transient cerebral ischemic attack, unspecified: Secondary | ICD-10-CM | POA: Diagnosis not present

## 2021-10-10 DIAGNOSIS — I10 Essential (primary) hypertension: Secondary | ICD-10-CM

## 2021-10-10 DIAGNOSIS — Z881 Allergy status to other antibiotic agents status: Secondary | ICD-10-CM | POA: Diagnosis not present

## 2021-10-10 DIAGNOSIS — R569 Unspecified convulsions: Secondary | ICD-10-CM | POA: Diagnosis not present

## 2021-10-10 DIAGNOSIS — E669 Obesity, unspecified: Secondary | ICD-10-CM | POA: Diagnosis present

## 2021-10-10 DIAGNOSIS — R479 Unspecified speech disturbances: Principal | ICD-10-CM

## 2021-10-10 DIAGNOSIS — Z8249 Family history of ischemic heart disease and other diseases of the circulatory system: Secondary | ICD-10-CM

## 2021-10-10 DIAGNOSIS — Z90722 Acquired absence of ovaries, bilateral: Secondary | ICD-10-CM

## 2021-10-10 DIAGNOSIS — R471 Dysarthria and anarthria: Secondary | ICD-10-CM | POA: Diagnosis present

## 2021-10-10 DIAGNOSIS — Z90710 Acquired absence of both cervix and uterus: Secondary | ICD-10-CM

## 2021-10-10 DIAGNOSIS — Z87891 Personal history of nicotine dependence: Secondary | ICD-10-CM | POA: Diagnosis not present

## 2021-10-10 DIAGNOSIS — Z820 Family history of epilepsy and other diseases of the nervous system: Secondary | ICD-10-CM

## 2021-10-10 DIAGNOSIS — Z888 Allergy status to other drugs, medicaments and biological substances status: Secondary | ICD-10-CM

## 2021-10-10 DIAGNOSIS — Z6837 Body mass index (BMI) 37.0-37.9, adult: Secondary | ICD-10-CM

## 2021-10-10 DIAGNOSIS — G4733 Obstructive sleep apnea (adult) (pediatric): Secondary | ICD-10-CM | POA: Diagnosis present

## 2021-10-10 DIAGNOSIS — R001 Bradycardia, unspecified: Secondary | ICD-10-CM | POA: Diagnosis present

## 2021-10-10 DIAGNOSIS — R000 Tachycardia, unspecified: Secondary | ICD-10-CM | POA: Diagnosis not present

## 2021-10-10 DIAGNOSIS — Z8042 Family history of malignant neoplasm of prostate: Secondary | ICD-10-CM

## 2021-10-10 DIAGNOSIS — Z7982 Long term (current) use of aspirin: Secondary | ICD-10-CM

## 2021-10-10 DIAGNOSIS — R9401 Abnormal electroencephalogram [EEG]: Secondary | ICD-10-CM | POA: Diagnosis present

## 2021-10-10 DIAGNOSIS — R0789 Other chest pain: Secondary | ICD-10-CM | POA: Diagnosis not present

## 2021-10-10 DIAGNOSIS — R4701 Aphasia: Secondary | ICD-10-CM | POA: Diagnosis present

## 2021-10-10 HISTORY — DX: Diverticulitis of intestine, part unspecified, without perforation or abscess without bleeding: K57.92

## 2021-10-10 LAB — URINALYSIS, ROUTINE W REFLEX MICROSCOPIC
Bilirubin Urine: NEGATIVE
Glucose, UA: NEGATIVE mg/dL
Hgb urine dipstick: NEGATIVE
Ketones, ur: NEGATIVE mg/dL
Nitrite: NEGATIVE
Protein, ur: NEGATIVE mg/dL
Specific Gravity, Urine: 1.015 (ref 1.005–1.030)
pH: 6.5 (ref 5.0–8.0)

## 2021-10-10 LAB — RAPID URINE DRUG SCREEN, HOSP PERFORMED
Amphetamines: NOT DETECTED
Barbiturates: NOT DETECTED
Benzodiazepines: NOT DETECTED
Cocaine: NOT DETECTED
Opiates: NOT DETECTED
Tetrahydrocannabinol: NOT DETECTED

## 2021-10-10 LAB — CBC WITH DIFFERENTIAL/PLATELET
Abs Immature Granulocytes: 0.03 10*3/uL (ref 0.00–0.07)
Basophils Absolute: 0 10*3/uL (ref 0.0–0.1)
Basophils Relative: 0 %
Eosinophils Absolute: 0.1 10*3/uL (ref 0.0–0.5)
Eosinophils Relative: 1 %
HCT: 43.3 % (ref 36.0–46.0)
Hemoglobin: 14.2 g/dL (ref 12.0–15.0)
Immature Granulocytes: 0 %
Lymphocytes Relative: 29 %
Lymphs Abs: 2.6 10*3/uL (ref 0.7–4.0)
MCH: 29.2 pg (ref 26.0–34.0)
MCHC: 32.8 g/dL (ref 30.0–36.0)
MCV: 89.1 fL (ref 80.0–100.0)
Monocytes Absolute: 0.5 10*3/uL (ref 0.1–1.0)
Monocytes Relative: 6 %
Neutro Abs: 5.9 10*3/uL (ref 1.7–7.7)
Neutrophils Relative %: 64 %
Platelets: 314 10*3/uL (ref 150–400)
RBC: 4.86 MIL/uL (ref 3.87–5.11)
RDW: 13.4 % (ref 11.5–15.5)
WBC: 9.2 10*3/uL (ref 4.0–10.5)
nRBC: 0 % (ref 0.0–0.2)

## 2021-10-10 LAB — COMPREHENSIVE METABOLIC PANEL
ALT: 12 U/L (ref 0–44)
AST: 15 U/L (ref 15–41)
Albumin: 4 g/dL (ref 3.5–5.0)
Alkaline Phosphatase: 77 U/L (ref 38–126)
Anion gap: 10 (ref 5–15)
BUN: 19 mg/dL (ref 8–23)
CO2: 29 mmol/L (ref 22–32)
Calcium: 9.2 mg/dL (ref 8.9–10.3)
Chloride: 99 mmol/L (ref 98–111)
Creatinine, Ser: 1.05 mg/dL — ABNORMAL HIGH (ref 0.44–1.00)
GFR, Estimated: 54 mL/min — ABNORMAL LOW (ref >60)
Glucose, Bld: 96 mg/dL (ref 70–99)
Potassium: 3.8 mmol/L (ref 3.5–5.1)
Sodium: 138 mmol/L (ref 135–145)
Total Bilirubin: 0.5 mg/dL (ref 0.3–1.2)
Total Protein: 7.9 g/dL (ref 6.5–8.1)

## 2021-10-10 LAB — APTT: aPTT: 25 seconds (ref 24–36)

## 2021-10-10 LAB — PROTIME-INR
INR: 1 (ref 0.8–1.2)
Prothrombin Time: 13.2 seconds (ref 11.4–15.2)

## 2021-10-10 LAB — URINALYSIS, MICROSCOPIC (REFLEX): RBC / HPF: NONE SEEN RBC/hpf (ref 0–5)

## 2021-10-10 LAB — CBG MONITORING, ED: Glucose-Capillary: 95 mg/dL (ref 70–99)

## 2021-10-10 LAB — ETHANOL: Alcohol, Ethyl (B): <10 mg/dL (ref <10)

## 2021-10-10 MED ORDER — AMOXICILLIN-POT CLAVULANATE 875-125 MG PO TABS
1.0000 | ORAL_TABLET | Freq: Once | ORAL | Status: AC
  Administered 2021-10-10: 1 via ORAL
  Filled 2021-10-10: qty 1

## 2021-10-10 NOTE — ED Triage Notes (Signed)
Patient states she is here to be seen for two things - one for brushing on her legs and two - had an episode of confusion yesterday.   Patient states she wasn't able to read or speak - onset was 10am-11am yesterday.   Patient is back to baseline mentally at this time. A&Ox4 - no slurred speech noted.

## 2021-10-10 NOTE — ED Provider Notes (Signed)
[facility] EMERGENCY DEPARTMENT Provider Note   CSN: [id number] Arrival date & time: 10/10/21  1524     History  Chief Complaint  Patient presents with   Leg Pain   Altered Mental Status    Marissa Dodson is a 81 y.o. female.  81 year old female with a history of hypertension who presents to the emergency department with difficulty speaking.  Patient states that approximately 2 weeks ago she was helping a loved 1 and noticed that she was having difficulty speaking.  Thinks that her speech may have been slurred at that time.  Reports that her symptoms spontaneously resolved shortly afterwards.  Says that since then she has felt that she has been having some memory difficulties.  Reports that yesterday from 10 to 11 AM in the morning she was reading out loud and then had difficulty speaking with possible slurred speech again.  Lasted 10 minutes. Denies any numbness or weakness aside from left hand weakness that has since resolved.  No vision changes or difficulty swallowing.  Not on blood thinners.  No history of stroke.  Also reports that she has a bruise on her left lower extremity that has been present for approximately a week.  Denies any trauma to the area.  No gingival bleeding or other bruising.   Leg Pain Altered Mental Status      Home Medications Prior to Admission medications   Medication Sig Start Date End Date Taking? Authorizing Provider  acetaminophen (TYLENOL) 500 MG tablet Take 500 mg by mouth daily as needed for headache.   Yes [provider]  albuterol (VENTOLIN HFA) 108 (90 Base) MCG/ACT inhaler Inhale 2 puffs into the lungs every 6 (six) hours as needed for wheezing or shortness of breath.   Yes [provider]  amitriptyline (ELAVIL) 50 MG tablet Take 50 mg by mouth at bedtime.   Yes [provider]  aspirin EC 81 MG tablet Take 81 mg by mouth daily.   Yes [provider]  atenolol (TENORMIN) 100 MG tablet Take  100 mg by mouth daily.   Yes [provider]  Carboxymethylcellul-Glycerin (LUBRICATING EYE DROPS OP) Place 1 drop into both eyes daily as needed (dry eyes).   Yes [provider]  cetirizine (ZYRTEC) 10 MG tablet Take 10 mg by mouth daily.   Yes [provider]  furosemide (LASIX) 40 MG tablet Take 20 mg by mouth See admin instructions. 1/2 tablet (20 mg) every morning, takes the other 1/2 tablet (20 mg) later in the day if needed for swelling   Yes [provider]  levocetirizine (XYZAL) 5 MG tablet Take 5 mg by mouth daily. 08/14/21  Yes [provider]  omeprazole (PRILOSEC) 20 MG capsule Take 20 mg by mouth daily. 07/17/16  Yes [provider]  polyethylene glycol powder (GLYCOLAX/MIRALAX) 17 GM/SCOOP powder Take 17 g by mouth daily as needed for mild constipation. 08/30/20  Yes [provider]  potassium chloride SA (K-DUR,KLOR-CON) 20 MEQ tablet Take 20 mEq by mouth daily.   Yes [provider]      Allergies    Ketek [telithromycin], Toprol xl [metoprolol], Cephalosporins, Codeine, Durezol [difluprednate], Flagyl [metronidazole], Levaquin [levofloxacin], Maxzide [hydrochlorothiazide w-triamterene], Mobic [meloxicam], Motrin [ibuprofen], Nsaids, Prednisone, and Vistaril [hydroxyzine]    Review of Systems   Review of Systems  Physical Exam Updated Vital Signs BP (!) 127/58 (BP Location: Right Arm)   Pulse 67   Temp 97.9 F (36.6 C) (Oral)   Resp 17  Ht 5\' 3"  (1.6 m)   Wt 96.2 kg   SpO2 95%   BMI 37.55 kg/m  Physical Exam Vitals and nursing note reviewed.  Constitutional:      General: She is not in acute distress.    Appearance: She is well-developed.  HENT:     Head: Normocephalic and atraumatic.     Right Ear: External ear normal.     Left Ear: External ear normal.     Nose: Nose normal.  Eyes:     Extraocular Movements: Extraocular movements intact.     Conjunctiva/sclera: Conjunctivae normal.      Pupils: Pupils are equal, round, and reactive to light.  Cardiovascular:     Rate and Rhythm: Normal rate and regular rhythm.     Heart sounds: No murmur heard. Pulmonary:     Effort: Pulmonary effort is normal. No respiratory distress.     Breath sounds: Normal breath sounds.  Abdominal:     General: Abdomen is flat. There is no distension.     Palpations: Abdomen is soft. There is no mass.     Tenderness: There is no abdominal tenderness. There is no guarding.  Musculoskeletal:        General: No swelling.     Cervical back: Normal range of motion and neck supple.     Right lower leg: No edema.     Left lower leg: No edema.  Skin:    General: Skin is warm and dry.     Capillary Refill: Capillary refill takes less than 2 seconds.     Comments: See bruise below on left lateral shin  Neurological:     Mental Status: She is alert.     Comments: MENTAL STATUS: AAOx3 CRANIAL NERVES: II: Pupils equal and reactive 3 mm BL, no RAPD III, IV, VI: EOM intact, no gaze preference or deviation, no nystagmus. V: normal sensation to light touch in V1, V2, and V3 segments bilaterally VII: no facial weakness or asymmetry, no nasolabial fold flattening VIII: normal hearing to speech and finger friction IX, X: normal palatal elevation, no uvular deviation XI: 5/5 head turn and 5/5 shoulder shrug bilaterally XII: midline tongue protrusion MOTOR: 5/5 strength in R shoulder flexion, elbow flexion and extension, and grip strength. 5/5 strength in L shoulder flexion, elbow flexion and extension, and grip strength.  5/5 strength in R hip and knee flexion, knee extension, ankle plantar and dorsiflexion. 5/5 strength in L hip and knee flexion, knee extension, ankle plantar and dorsiflexion. SENSORY: Normal sensation to light touch in all extremities COORD: Normal finger to nose and heel to shin, no tremor, no dysmetria STATION: normal stance, no truncal ataxia GAIT: Normal   Psychiatric:        Mood  and Affect: Mood normal.    LLE     ED Results / Procedures / Treatments   Labs (all labs ordered are listed, but only abnormal results are displayed) Labs Reviewed  COMPREHENSIVE METABOLIC PANEL - Abnormal; Notable for the following components:      Result Value   Creatinine, Ser 1.05 (*)    GFR, Estimated 54 (*)    All other components within normal limits  URINALYSIS, ROUTINE W REFLEX MICROSCOPIC - Abnormal; Notable for the following components:   Leukocytes,Ua MODERATE (*)    All other components within normal limits  URINALYSIS, MICROSCOPIC (REFLEX) - Abnormal; Notable for the following components:   Bacteria, UA RARE (*)    All other components within normal limits  LIPID PANEL - Abnormal; Notable for the following components:   LDL Cholesterol 114 (*)    All other components within normal limits  CBC WITH DIFFERENTIAL/PLATELET  APTT  PROTIME-INR  ETHANOL  RAPID URINE DRUG SCREEN, HOSP PERFORMED  HEMOGLOBIN A1C  CBG MONITORING, ED    EKG None  Radiology [facility]ID ([facility])  Result Date: 10/11/2021 Carotid Arterial Duplex Study Patient Name:  Marissa Dodson  Date of Exam:   10/11/2021 Medical Rec #: [id number]          Accession #:    [id number] Date of Birth: 08/06/1940          Patient Gender: F Patient Age:   89 years [facility] Procedure:      VAS US CAROTID Referring Phys: ERIC CHEN --------------------------------------------------------------------------------  Indications:       TIA. Risk Factors:      Hypertension. Comparison Study:  No prior study Performing Technologist: Gertie FeyMichelle Simonetti MHA, RDMS, RVT, RDCS  Examination Guidelines: A complete evaluation includes B-mode imaging, spectral Doppler, color Doppler, and power Doppler as needed of all accessible portions of each vessel. Bilateral testing is considered an integral part of a complete examination. Limited examinations for reoccurring indications may be performed  as noted.  Right Carotid Findings: +----------+--------+--------+--------+-----------------------+--------+           PSV cm/sEDV cm/sStenosisPlaque Description     Comments +----------+--------+--------+--------+-----------------------+--------+ CCA Prox  107     19                                              +----------+--------+--------+--------+-----------------------+--------+ CCA Distal69      12              smooth and heterogenous         +----------+--------+--------+--------+-----------------------+--------+ ICA Prox  75      24              smooth and heterogenous         +----------+--------+--------+--------+-----------------------+--------+ ICA Distal79      25                                              +----------+--------+--------+--------+-----------------------+--------+ ECA       85                      heterogenous                    +----------+--------+--------+--------+-----------------------+--------+ +----------+--------+-------+----------------+-------------------+           PSV cm/sEDV cmsDescribe        Arm Pressure (mmHG) +----------+--------+-------+----------------+-------------------+ [id number]      50     Multiphasic, WNL                    +----------+--------+-------+----------------+-------------------+ +---------+--------+--+--------+--+---------+ VertebralPSV cm/s41EDV cm/s11Antegrade +---------+--------+--+--------+--+---------+  Left Carotid Findings: +----------+--------+--------+--------+------------------+------------------+           PSV cm/sEDV cm/sStenosisPlaque DescriptionComments           +----------+--------+--------+--------+------------------+------------------+ CCA Prox  110     25  intimal thickening +----------+--------+--------+--------+------------------+------------------+ CCA Distal95      20                                                    +----------+--------+--------+--------+------------------+------------------+ ICA Prox  54      17                                                   +----------+--------+--------+--------+------------------+------------------+ ICA Distal65      18                                tortuous           +----------+--------+--------+--------+------------------+------------------+ ECA       106     9                                                    +----------+--------+--------+--------+------------------+------------------+ +----------+--------+--------+----------------+-------------------+           PSV cm/sEDV cm/sDescribe        Arm Pressure (mmHG) +----------+--------+--------+----------------+-------------------+ [id number]             Multiphasic, WNL                    +----------+--------+--------+----------------+-------------------+ +---------+--------+--+--------+-+---------+ VertebralPSV cm/s39EDV cm/s9Antegrade +---------+--------+--+--------+-+---------+   Summary: Right Carotid: Velocities in the right ICA are consistent with a 1-39% stenosis. Left Carotid: The extracranial vessels were near-normal with only minimal wall               thickening or plaque. Vertebrals:  Bilateral vertebral arteries demonstrate antegrade flow. Subclavians: Normal flow hemodynamics were seen in bilateral subclavian              arteries. *See table(s) above for measurements and observations.     Preliminary    MR ANGIO HEAD WO CONTRAST  Result Date: 10/11/2021 CLINICAL DATA:  81 year old female with intermittent abnormal speech and word-finding difficulty since late July. EXAM: MRA HEAD WITHOUT CONTRAST TECHNIQUE: Angiographic images of the Circle of Willis were acquired using MRA technique without intravenous contrast. COMPARISON:  Brain MRI today. FINDINGS: Motion degraded exam. Anterior circulation: Motion artifact degrades detail of both ICA siphons which appear to remain  patent with antegrade flow bilaterally. Patent carotid termini, MCA and ACA origins. MCA M1 segments and bifurcations are patent although some vessel detail is degraded. When allowing for motion artifact the visible MCA and ACA branches are within normal limits. Posterior circulation: Antegrade flow in the posterior circulation. Distal vertebral arteries and basilar artery to the distal basilar appear patent without stenosis. Both PICA origins are patent. Motion artifact felt responsible for irregularity of the distal basilar artery proximal to the tip (series 1030, image 9). SCA and PCA origins remain patent. Right posterior communicating artery is visible, the left is diminutive or absent. Bilateral PCA branches are within normal limits. Anatomic variants: None. Other: Brain MRI today reported separately. IMPRESSION: Negative for age intracranial MRA when allowing for motion artifact. Electronically Signed  By: Odessa Fleming M.D.   On: 10/11/2021 05:30   MR BRAIN WO CONTRAST  Result Date: 10/11/2021 CLINICAL DATA:  81 year old female with intermittent abnormal speech and word-finding difficulty since late July. EXAM: MRI HEAD WITHOUT CONTRAST TECHNIQUE: Multiplanar, multiecho pulse sequences of the brain and surrounding structures were obtained without intravenous contrast. COMPARISON:  Head CT 10/10/2021.  Brain MRI 08/24/2016. FINDINGS: Brain: Cerebral volume has not significantly changed since 2018 and appears normal for age. No restricted diffusion to suggest acute infarction. No midline shift, mass effect, evidence of mass lesion, ventriculomegaly, extra-axial collection or acute intracranial hemorrhage. Cervicomedullary junction and pituitary are within normal limits. Wallace Cullens and white matter signal appears stable since 2018 and within normal limits for age. No cortical encephalomalacia or chronic cerebral blood products identified. Deep gray matter nuclei, brainstem, and cerebellum appear negative. Vascular:  Major intracranial vascular flow voids are stable. Skull and upper cervical spine: Negative. Visualized bone marrow signal is within normal limits. Negative for age visible cervical spine. Sinuses/Orbits: Postoperative changes to both globes since 2018. Paranasal sinuses and mastoids are stable and well aerated. Other: Visible scalp and face appear negative. IMPRESSION: No acute intracranial abnormality. Normal for age noncontrast MRI appearance of the brain, stable since 2018. Electronically Signed   By: Odessa Fleming M.D.   On: 10/11/2021 05:13   CT HEAD WO CONTRAST  Result Date: 10/10/2021 CLINICAL DATA:  Altered mental status EXAM: CT HEAD WITHOUT CONTRAST TECHNIQUE: Contiguous axial images were obtained from the base of the skull through the vertex without intravenous contrast. RADIATION DOSE REDUCTION: This exam was performed according to the departmental dose-optimization program which includes automated exposure control, adjustment of the mA and/or kV according to patient size and/or use of iterative reconstruction technique. COMPARISON:  MR brain done on 08/24/2016 FINDINGS: Brain: No acute intracranial findings are seen. There are no signs of bleeding within the cranium. Cortical sulci are prominent. Ventricles are not dilated. Vascular: Unremarkable. Skull: Unremarkable. Sinuses/Orbits: Unremarkable. Other: None. IMPRESSION: No acute intracranial findings are seen in noncontrast CT brain. Atrophy. Electronically Signed   By: Ernie Avena M.D.   On: 10/10/2021 17:33    Procedures Procedures   Medications Ordered in ED Medications  Oral care mouth rinse (has no administration in time range)   stroke: early stages of recovery book (has no administration in time range)  acetaminophen (TYLENOL) tablet 650 mg (has no administration in time range)    Or  acetaminophen (TYLENOL) 160 MG/5ML solution 650 mg (has no administration in time range)    Or  acetaminophen (TYLENOL) suppository 650 mg  (has no administration in time range)  heparin injection 5,000 Units (5,000 Units Subcutaneous Given 10/11/21 0526)  aspirin EC tablet 81 mg (81 mg Oral Given 10/11/21 1043)  rosuvastatin (CRESTOR) tablet 20 mg (20 mg Oral Not Given 10/11/21 1043)  perflutren lipid microspheres (DEFINITY) IV suspension (2.5 mLs Intravenous Given 10/11/21 1037)  amoxicillin-clavulanate (AUGMENTIN) 875-125 MG per tablet 1 tablet (1 tablet Oral Given 10/10/21 1746)  LORazepam (ATIVAN) injection 1 mg (1 mg Intravenous Given 10/11/21 0343)    ED Course/ Medical Decision Making/ A&P Clinical Course as of 10/11/21 1210  Wed Oct 10, 2021  1805 Spoke with Dr Amada Jupiter from neurology. Feels that she [facility]IA evaluation. [RP]  1821 Dr Katrinka Blazing from hospitalist has accepted for admission.  [RP]    Clinical Course User Index [RP] Rondel Baton, MD  Medical Decision Making Amount and/or Complexity of Data Reviewed Labs: ordered. Radiology: ordered.  Risk Prescription drug management. Decision regarding hospitalization.   Marissa Dodson is a 81 y.o. female with history of hypertension and obesity who presents with chief complaint of difficulty speaking.  Initial Ddx:  Word finding difficulty, TIA, delirium  MDM:  Feel the patient likely had either word finding difficulty due to her age or TIA given her symptoms.  Considered delirium but would be very mild case and the patient is alert and oriented x3 at this time.  We will check her urine for UTI and initiate TIA work-up.  Plan:  Labs Urinalysis Urine tox/alcohol CT head Neurology consult  ED Summary:  CT head did not show acute changes.  Labs were unremarkable aside from possible urinary tract infection to the patient was given Augmentin to treat for possible UTI given her cephalosporin allergy.  Discussed with neurology who felt that the patient should be transferred for TIA evaluation to  [facility].  Dispo: Admit to Floor   Records reviewed Care Everywhere The following labs were independently interpreted: Chemistry and Urinalysis I independently visualized the following imaging with scope of interpretation limited to determining acute life threatening conditions related to emergency care: CT Head, which revealed no acute abnormality  Consults: Neurology  Final Clinical Impression(s) / ED Diagnoses Final diagnoses:  Difficulty speaking    Rx / DC Orders ED Discharge Orders     None         Rondel Baton, MD 10/11/21 1210

## 2021-10-10 NOTE — Plan of Care (Signed)
Transfer from [facility] Marissa Dodson is a 81 y/o with pmh of DM and obesity who presented with patient 2 weeks ago and then yesterday lasting approximately 10 minutes apiece.  CT negative.  Neurology recommended admission as ABC score 3 or further work-up.  Orders placed for observation in a telemetry bed.

## 2021-10-10 NOTE — ED Notes (Signed)
Patient transported to CT 

## 2021-10-10 NOTE — ED Notes (Signed)
ED Provider at bedside. 

## 2021-10-11 ENCOUNTER — Observation Stay (HOSPITAL_BASED_OUTPATIENT_CLINIC_OR_DEPARTMENT_OTHER): Payer: Medicare HMO

## 2021-10-11 ENCOUNTER — Observation Stay (HOSPITAL_COMMUNITY): Payer: Medicare HMO

## 2021-10-11 ENCOUNTER — Encounter (HOSPITAL_COMMUNITY): Payer: Self-pay | Admitting: Internal Medicine

## 2021-10-11 DIAGNOSIS — R2990 Unspecified symptoms and signs involving the nervous system: Secondary | ICD-10-CM

## 2021-10-11 DIAGNOSIS — I10 Essential (primary) hypertension: Secondary | ICD-10-CM | POA: Diagnosis not present

## 2021-10-11 DIAGNOSIS — G459 Transient cerebral ischemic attack, unspecified: Secondary | ICD-10-CM | POA: Diagnosis not present

## 2021-10-11 DIAGNOSIS — E785 Hyperlipidemia, unspecified: Secondary | ICD-10-CM | POA: Diagnosis not present

## 2021-10-11 DIAGNOSIS — R479 Unspecified speech disturbances: Secondary | ICD-10-CM | POA: Diagnosis not present

## 2021-10-11 DIAGNOSIS — R4701 Aphasia: Secondary | ICD-10-CM | POA: Diagnosis not present

## 2021-10-11 LAB — ECHOCARDIOGRAM COMPLETE
Area-P 1/2: 2.9 cm2
Calc EF: 73.2 %
Height: 63 in
S' Lateral: 2.8 cm
Single Plane A2C EF: 78.6 %
Single Plane A4C EF: 68.5 %
Weight: 3392 oz

## 2021-10-11 LAB — LIPID PANEL
Cholesterol: 191 mg/dL (ref 0–200)
HDL: 61 mg/dL (ref >40)
LDL Cholesterol: 114 mg/dL — ABNORMAL HIGH (ref 0–99)
Total CHOL/HDL Ratio: 3.1 RATIO
Triglycerides: 79 mg/dL (ref <150)
VLDL: 16 mg/dL (ref 0–40)

## 2021-10-11 LAB — HEMOGLOBIN A1C
Hgb A1c MFr Bld: 5.6 % (ref 4.8–5.6)
Mean Plasma Glucose: 114.02 mg/dL

## 2021-10-11 MED ORDER — HEPARIN SODIUM (PORCINE) 5000 UNIT/ML IJ SOLN
5000.0000 [IU] | Freq: Three times a day (TID) | INTRAMUSCULAR | Status: DC
  Administered 2021-10-11 – 2021-10-13 (×7): 5000 [IU] via SUBCUTANEOUS
  Filled 2021-10-11 (×8): qty 1

## 2021-10-11 MED ORDER — ROSUVASTATIN CALCIUM 20 MG PO TABS
20.0000 mg | ORAL_TABLET | Freq: Every day | ORAL | Status: DC
  Filled 2021-10-11 (×3): qty 1

## 2021-10-11 MED ORDER — ASPIRIN 81 MG PO TBEC
81.0000 mg | DELAYED_RELEASE_TABLET | Freq: Every day | ORAL | Status: DC
  Administered 2021-10-11 – 2021-10-13 (×3): 81 mg via ORAL
  Filled 2021-10-11 (×3): qty 1

## 2021-10-11 MED ORDER — ACETAMINOPHEN 160 MG/5ML PO SOLN: 650.0000 mg | ORAL | Status: DC | PRN

## 2021-10-11 MED ORDER — ACETAMINOPHEN 325 MG PO TABS
650.0000 mg | ORAL_TABLET | ORAL | Status: DC | PRN
  Administered 2021-10-11 – 2021-10-13 (×4): 650 mg via ORAL
  Filled 2021-10-11 (×4): qty 2

## 2021-10-11 MED ORDER — STROKE: EARLY STAGES OF RECOVERY BOOK
Freq: Once | Status: AC
  Filled 2021-10-11: qty 1

## 2021-10-11 MED ORDER — PERFLUTREN LIPID MICROSPHERE
1.0000 mL | INTRAVENOUS | Status: AC | PRN
  Administered 2021-10-11: 2.5 mL via INTRAVENOUS

## 2021-10-11 MED ORDER — LORAZEPAM 2 MG/ML IJ SOLN
1.0000 mg | INTRAMUSCULAR | Status: AC | PRN
  Administered 2021-10-11: 1 mg via INTRAVENOUS
  Filled 2021-10-11: qty 1

## 2021-10-11 MED ORDER — ACETAMINOPHEN 650 MG RE SUPP: 650.0000 mg | RECTAL | Status: DC | PRN

## 2021-10-11 MED ORDER — ORAL CARE MOUTH RINSE: 15.0000 mL | OROMUCOSAL | Status: DC | PRN

## 2021-10-11 NOTE — Consult Note (Signed)
NEUROLOGY CONSULTATION NOTE   Date of service: October 11, 2021 Patient Name: Marissa Dodson MRN:  [id number] DOB:  10/16/1940 Reason for consult: "episode of aphasia that spontaneously resolved" Requesting Provider: Carollee Herter, DO _ _ _   _ __   _ __ _ _  __ __   _ __   __ _  History of Present Illness  Marissa Dodson is a 81 y.o. female with PMH significant for HTN, obesity, DM2, GERD, Sleep apnea who presents with intermittent slurred speech and word finding difficulty.  Patient reports back in late July, she was reading her Bible when she had difficulty coming up with words in her head.  She would make errors and substitute them for other words.  She reports that her pronunciation was intact and denies any slurring of her speech.  Reports that back in July, this was persistent for 4 to 5 days.  She also reports that around that time, she noted that serum will drift from the right side of her mouth.  She tells me that since then, she has noted that whenever she is tired or lethargic or stressed out, she has trouble finding words but not as bad as back in July and also feels that her speech slurs.  Most recently she describes that she moved about 2 weeks ago because she got worried about living by herself with these episodes.  She noted that she had a 10-minute episode of word finding difficulty.  The last time she had this episode was yesterday.  She got worried and so she talked to her daughter and encouraged to come to the ED.  Also reports that she has a bruise/bleed on her left lower extremity that has been present for about a week now.  She does not recall hitting her leg on anything.  No prior history of strokes, sister had 2 strokes, mother had atrial fibrillation.  Does not smoke, does not drink alcohol, does not use any recreational substances.  Reports his diabetes is well controlled and so is her blood pressure.  LKW: July 2023 mRS: 0 tNKASE: not offered, outside  window Thrombectomy: not offered, outside window NIHSS components Score: Comment  1a Level of Conscious 0[x]  1[]  2[]  3[]      1b LOC Questions 0[x]  1[]  2[]       1c LOC Commands 0[x]  1[]  2[]       2 Best Gaze 0[x]  1[]  2[]       3 Visual 0[x]  1[]  2[]  3[]      4 Facial Palsy 0[x]  1[]  2[]  3[]      5a Motor Arm - left 0[x]  1[]  2[]  3[]  4[]  UN[]    5b Motor Arm - Right 0[x]  1[]  2[]  3[]  4[]  UN[]    6a Motor Leg - Left 0[x]  1[]  2[]  3[]  4[]  UN[]    6b Motor Leg - Right 0[x]  1[]  2[]  3[]  4[]  UN[]    7 Limb Ataxia 0[x]  1[]  2[]  3[]  UN[]     8 Sensory 0[x]  1[]  2[]  UN[]      9 Best Language 0[x]  1[]  2[]  3[]      10 Dysarthria 0[x]  1[]  2[]  UN[]      11 Extinct. and Inattention 0[x]  1[]  2[]       TOTAL: 0      ROS   Constitutional Denies weight loss, fever and chills.   HEENT Denies changes in vision and hearing.   Respiratory Denies SOB and cough.   CV Denies palpitations and CP   GI Denies abdominal pain, nausea, vomiting and diarrhea.  GU Denies dysuria and urinary frequency.   MSK Denies myalgia and joint pain.   Skin Denies rash and pruritus.   Neurological Denies headache and syncope.   Psychiatric Denies recent changes in mood. Denies anxiety and depression.    Past History   Past Medical History:  Diagnosis Date   Acute meniscal injury of left knee    Angioedema 08/16/2019   Complication of anesthesia    hypotention   Constipation    Diverticulitis    Dyspnea    spring needs inhaler   GERD (gastroesophageal reflux disease)    Hypertension    Hypoglycemia, unspecified    IC (interstitial cystitis)    Memory difficulties 07/18/2016   Sleep apnea    STOP-Bang =  5   Swelling of left knee joint    Past Surgical History:  Procedure Laterality Date   HEMORROIDECTOMY  1970's   KNEE ARTHROSCOPY Right 2009   KNEE ARTHROSCOPY Left 10/09/2012   Procedure: LEFT ARTHROSCOPY KNEE WITH medial meiscal DEBRIDEMENT;  Surgeon: Loanne Drilling, MD;  Location: [facility];  Service:  Orthopedics;  Laterality: Left;   LAPAROSCOPIC LYSIS OF ADHESIONS  1980'S   TONSILLECTOMY AND ADENOIDECTOMY  AGE 12   TOTAL ABDOMINAL HYSTERECTOMY W/ BILATERAL SALPINGOOPHORECTOMY  AGE 54   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   Procedure: TOTAL HIP ARTHROPLASTY ANTERIOR APPROACH;  Surgeon: Durene Romans, MD;  Location: [facility];  Service: Orthopedics;  Laterality: Right;   Family History  Problem Relation Age of Onset   Heart disease Mother    Alzheimer's disease Mother    Prostate cancer Father    Diabetes Sister    Diabetes Brother    Liver disease Brother    Asthma Grandson    Allergic rhinitis Grandson    Eczema Grandson    Allergic rhinitis Daughter    Allergic rhinitis Daughter    Allergic rhinitis Son    Food Allergy Granddaughter    Immunodeficiency Neg Hx    Urticaria Neg Hx    Angioedema Neg Hx    Atopy Neg Hx    Social History   Socioeconomic History   Marital status: Legally Separated    Spouse name: Not on file   Number of children: 3   Years of education: 14   Highest education level: Not on file  Occupational History   Occupation: Retired  Tobacco Use   Smoking status: Former    Packs/day: 0.50    Years: 20.00    Total pack years: 10.00    Types: Cigarettes    Quit date: 10/02/1992    Years since quitting: 29.0   Smokeless tobacco: Never  Vaping Use   Vaping Use: Never used  Substance and Sexual Activity   Alcohol use: No   Drug use: No   Sexual activity: Not on file  Other Topics Concern   Not on file  Social History Narrative   Lives alone   Caffeine use: Drinks water and coffee daily   Soda rare   Right handed    Social Determinants of Health   Financial Resource Strain: Not on file  Food Insecurity: No Food Insecurity (10/11/2021)   Hunger Vital Sign    Worried About Running Out of Food in the Last Year: Never true    Ran Out of Food in the Last Year: Never true  Transportation Needs: No Transportation Needs (10/11/2021)   PRAPARE -  Transportation    Lack of Transportation (Medical): No    Lack  of Transportation (Non-Medical): No  Physical Activity: Not on file  Stress: Not on file  Social Connections: Not on file   Allergies  Allergen Reactions   Ketek [Telithromycin] Shortness Of Breath and Other (See Comments)   Metoprolol Other (See Comments)    "Lopressor" Hands hurt   Cephalosporins Swelling and Other (See Comments)    Urinary retention   Codeine Itching   Durezol [Difluprednate]     Redness in eyes   Flagyl [Metronidazole] Swelling   Levofloxacin     Headache, edema   Maxzide [Hydrochlorothiazide W-Triamterene] Swelling    Low urine output   Meloxicam     Edema, low urine output   Prednisone Swelling    Low urine output    Vistaril [Hydroxyzine]     Short term memory loss    Medications   Medications Prior to Admission  Medication Sig Dispense Refill Last Dose   albuterol (VENTOLIN HFA) 108 (90 Base) MCG/ACT inhaler Inhale 2 puffs into the lungs every 6 (six) hours as needed for wheezing or shortness of breath.      amitriptyline (ELAVIL) 50 MG tablet Take 50 mg by mouth at bedtime.      atenolol (TENORMIN) 50 MG tablet Take 50 mg by mouth daily.      Carboxymethylcellul-Glycerin (LUBRICATING EYE DROPS OP) Place 1 drop into both eyes daily as needed (dry eyes).      cetirizine (ZYRTEC) 10 MG tablet Take 10 mg by mouth daily.      Cholecalciferol (VITAMIN D3) 75 MCG (3000 UT) TABS Take 3,000 Units by mouth daily.      docusate sodium (COLACE) 100 MG capsule Take 1 capsule (100 mg total) by mouth 2 (two) times daily. 10 capsule 0    fluticasone (FLONASE) 50 MCG/ACT nasal spray Place 2 sprays into both nostrils at bedtime.      furosemide (LASIX) 40 MG tablet Take 20 mg by mouth See admin instructions. Take 20 mg daily, may take a second 20 mg dose as needed for swelling      HYDROcodone-acetaminophen (NORCO/VICODIN) 5-325 MG tablet Take 1-2 tablets by mouth every 6 (six) hours as needed for severe  pain (pain score 7-10). 42 tablet 0    methocarbamol (ROBAXIN) 500 MG tablet Take 1 tablet (500 mg total) by mouth every 6 (six) hours as needed for muscle spasms. 40 tablet 0    methylPREDNISolone (MEDROL DOSEPAK) 4 MG TBPK tablet Take by mouth as directed on package 21 each 0    omeprazole (PRILOSEC) 20 MG capsule Take 20 mg by mouth 2 (two) times daily.      polyethylene glycol powder (GLYCOLAX/MIRALAX) 17 GM/SCOOP powder Take 17 g by mouth daily.      potassium chloride SA (K-DUR,KLOR-CON) 20 MEQ tablet Take 20 mEq by mouth daily.        Vitals   Vitals:   10/10/21 1900 10/10/21 2000 10/10/21 2300 10/11/21 0027  BP: (!) 177/77 (!) 141/70 (!) 111/57 (!) 145/66  Pulse: 62 60 72 68  Resp: 16 19 20 16   Temp:  98.4 F (36.9 C)  98.6 F (37 C)  TempSrc:  Oral  Oral  SpO2: 100% 93% 96% 96%  Weight:      Height:         Body mass index is 37.55 kg/m.  Physical Exam   General: Laying comfortably in bed; in no acute distress.  HENT: Normal oropharynx and mucosa. Normal external appearance of ears and nose.  Neck: Supple, no  pain or tenderness CV: No JVD. No peripheral edema.  Pulmonary: Symmetric Chest rise. Normal respiratory effort.  Abdomen: Soft to touch, non-tender.  Ext: No cyanosis, edema, or deformity  Skin: No rash. Normal palpation of skin.   Musculoskeletal: Normal digits and nails by inspection. No clubbing.   Neurologic Examination  Mental status/Cognition: Alert, oriented to self, place, month and year, good attention.  Speech/language: Fluent, comprehension intact, object naming intact, repetition intact.  Cranial nerves:   CN II Pupils equal and reactive to light, no VF deficits   CN III,IV,VI EOM intact, no gaze preference or deviation, no nystagmus    CN V normal sensation in V1, V2, and V3 segments bilaterally    CN VII no asymmetry, no nasolabial fold flattening    CN VIII normal hearing to speech    CN IX & X normal palatal elevation, no uvular deviation     CN XI 5/5 head turn and 5/5 shoulder shrug bilaterally    CN XII midline tongue protrusion    Motor:  Muscle bulk: normal, tone normal, pronator drift none tremor none Mvmt Root Nerve  Muscle Right Left Comments  SA C5/6 Ax Deltoid 5 5   EF C5/6 Mc Biceps 5 5   EE C6/7/8 Rad Triceps 5 5   WF C6/7 Med FCR     WE C7/8 PIN ECU     F Ab C8/T1 U ADM/FDI 5 5   HF L1/2/3 Fem Illopsoas 5 5   KE L2/3/4 Fem Quad 5 5   DF L4/5 D Peron Tib Ant 5 5   PF S1/2 Tibial Grc/Sol 5 5    Sensation:  Light touch Intact throughout   Pin prick    Temperature    Vibration   Proprioception    Coordination/Complex Motor:  - Finger to Nose intact bilaterally - Heel to shin intact bilaterally - Rapid alternating movement are normal - Gait: Deferred for patient safety. Labs   CBC:  Recent Labs  Lab 10/10/21 1655  WBC 9.2  NEUTROABS 5.9  HGB 14.2  HCT 43.3  MCV 89.1  PLT 314    Basic Metabolic Panel:  Lab Results  Component Value Date   NA 138 10/10/2021   K 3.8 10/10/2021   CO2 29 10/10/2021   GLUCOSE 96 10/10/2021   BUN 19 10/10/2021   CREATININE 1.05 (H) 10/10/2021   CALCIUM 9.2 10/10/2021   GFRNONAA 54 (L) 10/10/2021   GFRAA 70 08/11/2019   Lipid Panel:  Lab Results  Component Value Date   LDLCALC 77 11/12/2019   HgbA1c: No results found for: "HGBA1C" Urine Drug Screen:     Component Value Date/Time   LABOPIA NONE DETECTED 10/10/2021 1536   COCAINSCRNUR NONE DETECTED 10/10/2021 1536   LABBENZ NONE DETECTED 10/10/2021 1536   AMPHETMU NONE DETECTED 10/10/2021 1536   THCU NONE DETECTED 10/10/2021 1536   LABBARB NONE DETECTED 10/10/2021 1536    Alcohol Level     Component Value Date/Time   ETH <10 10/10/2021 1655    CT Head without contrast(Personally reviewed): CTH was negative for a large hypodensity concerning for a large territory infarct or hyperdensity concerning for an ICH  MR Angio head without contrast and Carotid Duplex BL: pending  MRI  Brain: pending  Impression   Marissa Dodson is a 81 y.o. female with PMH significant for HTN, obesity, DM2, GERD, Sleep apnea who presents with intermittent slurred speech and word finding difficulty. Her neurologic examination is notable for no focal deficits.  Needs further workup to identify a definitive etiology but I suspect that she probably had a stroke back in late July when she had word finding difficulty for 4-5 days along with cereal dripping from the right corner of her mouth and possibly fluctuating word finding difficulty that is worse when she is tired or lethargic or stressed. Alternatively, could also be focal seizures since these episodes are identical. However, would be odd to have a first time seizure at this age, no obvious seizure risk factor on history.  In addition to standard stroke workup, will also get a routine EEG.  Recommendations  - Frequent Neuro checks per stroke unit protocol - Recommend brain imaging with MRI Brain without contrast - Recommend Vascular imaging with MRA Angio Head without contrast and US Carotid doppler - Recommend obtaining TTE - Recommend obtaining Lipid panel with LDL - Please start statin if LDL > 70 - Recommend HbA1c - Antithrombotic - Aspirin 81mg  daily alone - Recommend DVT ppx - SBP goal - gradual normotension. - Recommend Telemetry monitoring for arrythmia - Recommend bedside swallow screen prior to PO intake. - Stroke education booklet - Recommend PT/OT/SLP consult - routine EEG.  ______________________________________________________________________   Thank you for the opportunity to take part in the care of this patient. If you have any further questions, please contact the neurology consultation attending.  Signed,  [facility] Pager Number Erick Blinks _ _ _   _ __   _ __ _ _  __ __   _ __   __ _

## 2021-10-11 NOTE — Progress Notes (Signed)
[facility]  PROGRESS NOTE  Marissa Dodson UXL:[id number] DOB: April 03, 1940 DOA: 10/10/2021 PCP: Elinor Dodge, MD   Brief HPI:   81 year old female with a history of hyperlipidemia, hypertension with 2-week history of intermittent aphasia and confusion.  Patient said that 2 weeks ago she was reading Bible at home, She got to some words that she knew what they were but she was unable to say them.  This resolved in about 10 minutes. Over the last 2 weeks she had several episodes where she had difficulty with speaking.  No facial droop. CT head was negative for acute abnormality. Neurology was consulted and recommended admission for the patient.   Subjective   Patient seen and examined, no new complaints at this time.   Assessment/Plan:     TIA -MRI brain/MRA brain were unremarkable -Carotid ultrasound shows bilateral carotid arteries with no significant stenosis -Echocardiogram showed EF of 70 to 75%.  No regional wall motion abnormality.  Mild LVH. -LDL is 114; start rosuvastatin 20 mg daily -Continue aspirin -EEG is pending -Neurology consult  Hypertension -Blood pressure is stable -Home medications atenolol, furosemide on hold for permissive hypertension  Hyperlipidemia -LDL 114 -Started on rosuvastatin as above   Medications     [START ON 10/12/2021]  stroke: early stages of recovery book   Does not apply Once   aspirin EC  81 mg Oral Daily   heparin  5,000 Units Subcutaneous Q8H   rosuvastatin  20 mg Oral Daily     Data Reviewed:   CBG:  Recent Labs  Lab 10/10/21 1653  GLUCAP 95    SpO2: 95 %    Vitals:   10/11/21 0027 10/11/21 0318 10/11/21 0746 10/11/21 1151  BP: (!) 145/66 (!) 139/53 (!) 128/58 (!) 127/58  Pulse: 68 66 73 67  Resp: 16 18 16 17   Temp: 98.6 F (37 C) 98.3 F (36.8 C) 98 F (36.7 C) 97.9 F (36.6 C)  TempSrc: Oral Oral Oral Oral  SpO2: 96% 96% 98% 95%  Weight:      Height:          Data  Reviewed:  Basic Metabolic Panel: Recent Labs  Lab 10/10/21 1655  NA 138  K 3.8  CL 99  CO2 29  GLUCOSE 96  BUN 19  CREATININE 1.05*  CALCIUM 9.2    CBC: Recent Labs  Lab 10/10/21 1655  WBC 9.2  NEUTROABS 5.9  HGB 14.2  HCT 43.3  MCV 89.1  PLT 314    LFT Recent Labs  Lab 10/10/21 1655  AST 15  ALT 12  ALKPHOS 77  BILITOT 0.5  PROT 7.9  ALBUMIN 4.0     Antibiotics: Anti-infectives (From admission, onward)    Start     Dose/Rate Route Frequency Ordered Stop   10/10/21 1745  amoxicillin-clavulanate (AUGMENTIN) 875-125 MG per tablet 1 tablet        1 tablet Oral  Once 10/10/21 1740 10/10/21 1746        DVT prophylaxis: Heparin  Code Status: Full code  Family Communication: No family at bedside   CONSULTS neurology   Objective    Physical Examination:   General-appears in no acute distress Heart-S1-S2, regular, no murmur auscultated Lungs-clear to auscultation bilaterally, no wheezing or crackles auscultated Abdomen-soft, nontender, no organomegaly Extremities-no edema in the lower extremities Neuro-alert, oriented x3, no focal deficit noted  Status is: Inpatient:           10/12/21   [facility]  If 7PM-7AM, please contact night-coverage at [email or web address], Office  [phone]   10/11/2021, 2:58 PM  LOS: 0 days

## 2021-10-11 NOTE — Progress Notes (Signed)
PT Cancellation Note  Patient Details Name: Marissa Dodson MRN: [id number] DOB: 1940-08-12   Cancelled Treatment:    Reason Eval/Treat Not Completed: Patient at procedure or test/unavailable. Pt currently off unit for ECHO. Will check back as schedule allows to initiate PT evaluation.    Marylynn Pearson 10/11/2021, 9:51 AM  Conni Slipper, PT, DPT [facility] Secure Chat Preferred Office: [phone]

## 2021-10-11 NOTE — Subjective & Objective (Signed)
CC: aphasia HPI: 81 year old African-American female history of hyperlipidemia, hypertension with a 2-week history of intermittent aphasia, intermittent confusion.  Patient states about 2 weeks ago she was reading her Bible at home.  She got to some words that she knew what they were but she was unable to say them.  This resolved in about 10 minutes.  Over the last 2 weeks, she has had several episodes where she has had difficulty with speaking.  She does not have any facial droop.  These episodes lasted only for several minutes.  Most memorable is about 10 days ago, she recently moved in with her daughter.  She states that this was very stressful for her.  She does remember that she has had difficulty speaking the most of the day.  She does state that she was very tired and very stressed that she had to move in with her daughter.  She does state that she was having difficulty with her left hand and dropping some items.  She also members going to the bathroom where she had sudden flashes of lights in her field of vision where she needed to cover her eyes.  She normally walks with a walker.  She states that she finally wanted to get this checked out and told her family which brought her to the ER.  CT head was negative for acute intracranial abnormality.  [facility] contacted for admission.  Reportedly, neurology was contacted about this patient and recommended admission.  However when I contacted in-house neurology, they state that they have no notations in their records they were contacted about this patient.  Still unclear who the neurologist was that was contacted in the beginning.

## 2021-10-11 NOTE — H&P (Signed)
History and Physical    Marissa Dodson RSW:[id number] DOB: 07-21-1940 DOA: 10/10/2021  DOS: the patient was seen and examined on 10/10/2021  PCP: Elinor Dodge, MD   Patient coming from: Home  I have personally briefly reviewed patient's old medical records in [facility]  CC: aphasia HPI: 81 year old African-American female history of hyperlipidemia, hypertension with a 2-week history of intermittent aphasia, intermittent confusion.  Patient states about 2 weeks ago she was reading her Bible at home.  She got to some words that she knew what they were but she was unable to say them.  This resolved in about 10 minutes.  Over the last 2 weeks, she has had several episodes where she has had difficulty with speaking.  She does not have any facial droop.  These episodes lasted only for several minutes.  Most memorable is about 10 days ago, she recently moved in with her daughter.  She states that this was very stressful for her.  She does remember that she has had difficulty speaking the most of the day.  She does state that she was very tired and very stressed that she had to move in with her daughter.  She does state that she was having difficulty with her left hand and dropping some items.  She also members going to the bathroom where she had sudden flashes of lights in her field of vision where she needed to cover her eyes.  She normally walks with a walker.  She states that she finally wanted to get this checked out and told her family which brought her to the ER.  CT head was negative for acute intracranial abnormality.  [facility] hospitalist contacted for admission.  Reportedly, neurology was contacted about this patient and recommended admission.  However when I contacted in-house neurology, they state that they have no notations in their records they were contacted about this patient.  Still unclear who the neurologist was that was contacted in the beginning.   ED  Course: CT head negative  Review of Systems:  Review of Systems  Constitutional:  Positive for malaise/fatigue. Negative for chills and fever.  HENT: Negative.    Eyes:        Flashes of light in both fields of vision about 10 days ago. Lasted only a few minutes.  Respiratory: Negative.    Cardiovascular: Negative.   Gastrointestinal: Negative.   Genitourinary: Negative.   Musculoskeletal: Negative.   Skin: Negative.   Neurological:  Positive for dizziness and focal weakness.  Endo/Heme/Allergies:  Bruises/bleeds easily.       Noted new bruise on left posterior calf. See picture.  Psychiatric/Behavioral: Negative.    All other systems reviewed and are negative.   Past Medical History:  Diagnosis Date   Acute meniscal injury of left knee    Angioedema 08/16/2019   Complication of anesthesia    hypotention   Constipation    Diverticulitis    Dyspnea    spring needs inhaler   GERD (gastroesophageal reflux disease)    Hypertension    Hypoglycemia, unspecified    IC (interstitial cystitis)    Memory difficulties 07/18/2016   Sleep apnea    STOP-Bang =  5   Swelling of left knee joint     Past Surgical History:  Procedure Laterality Date   HEMORROIDECTOMY  1970's   KNEE ARTHROSCOPY Right 2009   KNEE ARTHROSCOPY Left 10/09/2012   Procedure: LEFT ARTHROSCOPY KNEE WITH medial meiscal DEBRIDEMENT;  Surgeon: Loanne Drilling,  MD;  Location: [facility];  Service: Orthopedics;  Laterality: Left;   LAPAROSCOPIC LYSIS OF ADHESIONS  1980'S   TONSILLECTOMY AND ADENOIDECTOMY  AGE 25   TOTAL ABDOMINAL HYSTERECTOMY W/ BILATERAL SALPINGOOPHORECTOMY  AGE 65   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   Procedure: TOTAL HIP ARTHROPLASTY ANTERIOR APPROACH;  Surgeon: Durene Romans, MD;  Location: [facility];  Service: Orthopedics;  Laterality: Right;     reports that she quit smoking about 29 years ago. Her smoking use included cigarettes. She has a 10.00 pack-year smoking history.  She has never used smokeless tobacco. She reports that she does not drink alcohol and does not use drugs.  Allergies  Allergen Reactions   Ketek [Telithromycin] Shortness Of Breath and Other (See Comments)   Metoprolol Other (See Comments)    "Lopressor" Hands hurt   Cephalosporins Swelling and Other (See Comments)    Urinary retention   Codeine Itching   Durezol [Difluprednate]     Redness in eyes   Flagyl [Metronidazole] Swelling   Levofloxacin     Headache, edema   Maxzide [Hydrochlorothiazide W-Triamterene] Swelling    Low urine output   Meloxicam     Edema, low urine output   Prednisone Swelling    Low urine output    Vistaril [Hydroxyzine]     Short term memory loss    Family History  Problem Relation Age of Onset   Heart disease Mother    Alzheimer's disease Mother    Prostate cancer Father    Diabetes Sister    Diabetes Brother    Liver disease Brother    Asthma Grandson    Allergic rhinitis Grandson    Eczema Grandson    Allergic rhinitis Daughter    Allergic rhinitis Daughter    Allergic rhinitis Son    Food Allergy Granddaughter    Immunodeficiency Neg Hx    Urticaria Neg Hx    Angioedema Neg Hx    Atopy Neg Hx     Prior to Admission medications   Medication Sig Start Date End Date Taking? Authorizing Provider  albuterol (VENTOLIN HFA) 108 (90 Base) MCG/ACT inhaler Inhale 2 puffs into the lungs every 6 (six) hours as needed for wheezing or shortness of breath.    [provider]  amitriptyline (ELAVIL) 50 MG tablet Take 50 mg by mouth at bedtime.    [provider]  atenolol (TENORMIN) 50 MG tablet Take 50 mg by mouth daily.    [provider]  Carboxymethylcellul-Glycerin (LUBRICATING EYE DROPS OP) Place 1 drop into both eyes daily as needed (dry eyes).    [provider]  cetirizine (ZYRTEC) 10 MG tablet Take 10 mg by mouth daily.    [provider]  Cholecalciferol (VITAMIN D3) 75 MCG (3000 UT) TABS Take  3,000 Units by mouth daily.    [provider]  docusate sodium (COLACE) 100 MG capsule Take 1 capsule (100 mg total) by mouth 2 (two) times daily. 10/18/20   Cassandria Anger, PA-C  fluticasone (FLONASE) 50 MCG/ACT nasal spray Place 2 sprays into both nostrils at bedtime. 07/16/16   [provider]  furosemide (LASIX) 40 MG tablet Take 20 mg by mouth See admin instructions. Take 20 mg daily, may take a second 20 mg dose as needed for swelling    [provider]  HYDROcodone-acetaminophen (NORCO/VICODIN) 5-325 MG tablet Take 1-2 tablets by mouth every 6 (six) hours as needed for severe pain (pain score 7-10). 10/18/20   Domenic Schwab,  Alvan Dame, PA-C  methocarbamol (ROBAXIN) 500 MG tablet Take 1 tablet (500 mg total) by mouth every 6 (six) hours as needed for muscle spasms. 10/18/20   Cassandria Anger, PA-C  methylPREDNISolone (MEDROL DOSEPAK) 4 MG TBPK tablet Take by mouth as directed on package 01/17/21   Virgina Norfolk, DO  omeprazole (PRILOSEC) 20 MG capsule Take 20 mg by mouth 2 (two) times daily. 07/17/16   [provider]  polyethylene glycol powder (GLYCOLAX/MIRALAX) 17 GM/SCOOP powder Take 17 g by mouth daily. 08/30/20   [provider]  potassium chloride SA (K-DUR,KLOR-CON) 20 MEQ tablet Take 20 mEq by mouth daily.    [provider]    Physical Exam: Vitals:   10/10/21 1900 10/10/21 2000 10/10/21 2300 10/11/21 0027  BP: (!) 177/77 (!) 141/70 (!) 111/57 (!) 145/66  Pulse: 62 60 72 68  Resp: 16 19 20 16   Temp:  98.4 F (36.9 C)  98.6 F (37 C)  TempSrc:  Oral  Oral  SpO2: 100% 93% 96% 96%  Weight:      Height:        Physical Exam Vitals and nursing note reviewed.  Constitutional:      General: She is not in acute distress.    Appearance: Normal appearance. She is not ill-appearing, toxic-appearing or diaphoretic.  HENT:     Head: Normocephalic and atraumatic.     Nose: Nose normal.  Eyes:     General: No visual field deficit.     Pupils: Pupils are equal, round, and reactive to light.  Cardiovascular:     Rate and Rhythm: Normal rate and regular rhythm.     Pulses: Normal pulses.  Pulmonary:     Effort: Pulmonary effort is normal.     Breath sounds: Normal breath sounds. No rales.  Abdominal:     General: Bowel sounds are normal. There is no distension.     Palpations: Abdomen is soft.     Tenderness: There is no abdominal tenderness. There is no guarding or rebound.  Musculoskeletal:     Right lower leg: No edema.     Left lower leg: No edema.  Skin:    General: Skin is warm and dry.     Capillary Refill: Capillary refill takes less than 2 seconds.  Neurological:     Mental Status: She is alert and oriented to person, place, and time.     Cranial Nerves: No dysarthria.     Motor: Motor function is intact. No pronator drift.     Coordination: Finger-Nose-Finger Test abnormal.     Deep Tendon Reflexes:     Reflex Scores:      Bicep reflexes are 1+ on the right side and 1+ on the left side.      Patellar reflexes are 1+ on the right side and 1+ on the left side.     Labs on Admission: I have personally reviewed following labs and imaging studies  CBC: Recent Labs  Lab 10/10/21 1655  WBC 9.2  NEUTROABS 5.9  HGB 14.2  HCT 43.3  MCV 89.1  PLT 314   Basic Metabolic Panel: Recent Labs  Lab 10/10/21 1655  NA 138  K 3.8  CL 99  CO2 29  GLUCOSE 96  BUN 19  CREATININE 1.05*  CALCIUM 9.2   GFR: Estimated Creatinine Clearance: 47.2 mL/min (A) (by C-G formula based on SCr of 1.05 mg/dL (H)). Liver Function Tests: Recent Labs  Lab 10/10/21 1655  AST 15  ALT  12  ALKPHOS 77  BILITOT 0.5  PROT 7.9  ALBUMIN 4.0   No results for input(s): "LIPASE", "AMYLASE" in the last 168 hours. No results for input(s): "AMMONIA" in the last 168 hours. Coagulation Profile: Recent Labs  Lab 10/10/21 1655  INR 1.0   Cardiac Enzymes: No results for input(s): "CKTOTAL", "CKMB", "CKMBINDEX", "TROPONINI",  "TROPONINIHS" in the last 168 hours. BNP (last 3 results) No results for input(s): "PROBNP" in the last 8760 hours. HbA1C: No results for input(s): "HGBA1C" in the last 72 hours. CBG: Recent Labs  Lab 10/10/21 1653  GLUCAP 95   Lipid Profile: No results for input(s): "CHOL", "HDL", "LDLCALC", "TRIG", "CHOLHDL", "LDLDIRECT" in the last 72 hours. Thyroid Function Tests: No results for input(s): "TSH", "T4TOTAL", "FREET4", "T3FREE", "THYROIDAB" in the last 72 hours. Anemia Panel: No results for input(s): "VITAMINB12", "FOLATE", "FERRITIN", "TIBC", "IRON", "RETICCTPCT" in the last 72 hours. Urine analysis:    Component Value Date/Time   COLORURINE YELLOW 10/10/2021 1536   APPEARANCEUR CLEAR 10/10/2021 1536   LABSPEC 1.015 10/10/2021 1536   PHURINE 6.5 10/10/2021 1536   GLUCOSEU NEGATIVE 10/10/2021 1536   HGBUR NEGATIVE 10/10/2021 1536   BILIRUBINUR NEGATIVE 10/10/2021 1536   KETONESUR NEGATIVE 10/10/2021 1536   PROTEINUR NEGATIVE 10/10/2021 1536   NITRITE NEGATIVE 10/10/2021 1536   LEUKOCYTESUR MODERATE (A) 10/10/2021 1536    Radiological Exams on Admission: I have personally reviewed images CT HEAD WO CONTRAST  Result Date: 10/10/2021 CLINICAL DATA:  Altered mental status EXAM: CT HEAD WITHOUT CONTRAST TECHNIQUE: Contiguous axial images were obtained from the base of the skull through the vertex without intravenous contrast. RADIATION DOSE REDUCTION: This exam was performed according to the departmental dose-optimization program which includes automated exposure control, adjustment of the mA and/or kV according to patient size and/or use of iterative reconstruction technique. COMPARISON:  MR brain done on 08/24/2016 FINDINGS: Brain: No acute intracranial findings are seen. There are no signs of bleeding within the cranium. Cortical sulci are prominent. Ventricles are not dilated. Vascular: Unremarkable. Skull: Unremarkable. Sinuses/Orbits: Unremarkable. Other: None. IMPRESSION: No  acute intracranial findings are seen in noncontrast CT brain. Atrophy. Electronically Signed   By: Ernie Avena M.D.   On: 10/10/2021 17:33    EKG: My personal interpretation of EKG shows: no EKG  Assessment/Plan Principal Problem:   TIA (transient ischemic attack) Active Problems:   Hyperlipidemia   Essential hypertension    Assessment and Plan: * TIA (transient ischemic attack) Admit to observation telemetry bed.  Standard TIA work-up with MRI brain, carotid ultrasound, echocardiogram.  Patient failed her finger-nose on both sides.  Negative Romberg.  Concerned she may have had an occipital stroke.  Check lipid panel.  Start aspirin 81 mg daily.  Neurology notified.  Essential hypertension Pt has bradycardia with HR in the 50s. Will hold HTN meds for now and allow for permissive HTN until results of MRI brain are known.  Hyperlipidemia Check lipid panel.   DVT prophylaxis: SQ Heparin Code Status: Full Code Family Communication: no family at bedside  Disposition Plan: return home  Consults called: neurology(Khaliqdina)  Admission status: Observation, Telemetry bed   Carollee Herter, DO [facility] 10/11/2021, 1:36 AM

## 2021-10-11 NOTE — Assessment & Plan Note (Signed)
Check lipid panel  

## 2021-10-11 NOTE — Evaluation (Signed)
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   Chronic rhinitis 08/16/2019   Intolerance, food 08/16/2019   Hyperlipidemia 05/16/2019   Memory difficulties 07/18/2016   Past Medical History:  Past Medical History:  Diagnosis Date   Acute meniscal injury of left knee    Angioedema 08/16/2019   Complication of anesthesia    hypotention   Constipation    Diverticulitis    Dyspnea    spring needs inhaler   GERD (gastroesophageal reflux disease)    Hypertension    Hypoglycemia, unspecified    IC (interstitial cystitis)    Memory difficulties 07/18/2016   Sleep apnea    STOP-Bang =  5   Swelling of left knee joint    Past Surgical History:  Past Surgical History:  Procedure Laterality Date   HEMORROIDECTOMY  1970's   KNEE ARTHROSCOPY Right 2009   KNEE ARTHROSCOPY Left 10/09/2012   Procedure: LEFT ARTHROSCOPY KNEE WITH medial meiscal DEBRIDEMENT;  Surgeon: Loanne Drilling, MD;  [facility];  Service: Orthopedics;  Laterality: Left;   LAPAROSCOPIC LYSIS OF ADHESIONS  1980'S   TONSILLECTOMY AND ADENOIDECTOMY  AGE 2   TOTAL ABDOMINAL HYSTERECTOMY W/ BILATERAL SALPINGOOPHORECTOMY  AGE 19   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   Procedure: TOTAL HIP ARTHROPLASTY ANTERIOR APPROACH;  Surgeon: Durene Romans, MD;  Location: [facility];  Service: Orthopedics;  Laterality: Right;   HPI:  81 year old female admitted with episode of aphasia. Per reports, similar presentation in July of this year when she had word finding deficits for 4-5 days. MRI negative for acute infarct.   Assessment / Plan / Recommendation Clinical  Impression  Cognistat complete with patient scoring [facility] for all areas with the exception of confrontation naming. Patient presents with a mild, high level word finding deficit during conversation. Education complete with patient regarding compensatory strategies which may assist to find words when she presents with difficulty. Note that MRI negative. Per patient, she had a similar episode in 2018, during a period of high stress and anxiety when husband dying. She was seen by neurology who referred her to a psychiatriast who proposed possible anxiety meds and therapy but at the time patient did not follow through with this. She reported that she does question how much anxiety plays a role in her presentation. While this may contribute to difficulty with concentration and ultimately word finding deficits, it less explains right sided facial weakness that patient reports has occurred previously, now resolved. Regardless, patient seeking assistance for anxiety at this time. MD, please address if able. SLP will f/u for mild aphasia. Recommend OP f/u after d/c.    SLP Assessment  SLP Recommendation/Assessment: Patient needs continued Speech Lanaguage Pathology Services SLP Visit Diagnosis: Aphasia (R47.01)    Recommendations for follow up therapy are one component of a multi-disciplinary discharge planning process, led by the attending physician.  Recommendations may be updated based on patient status, additional functional criteria and insurance authorization.    Follow Up Recommendations  Outpatient SLP          Frequency and Duration min 1 x/week  1 week      SLP Evaluation Cognition  Overall Cognitive Status:  Within Functional Limits for tasks assessed       Comprehension  Auditory Comprehension Overall Auditory Comprehension: Appears within functional limits for tasks assessed Visual Recognition/Discrimination Discrimination: Within Function Limits Reading Comprehension Reading Status:  Within funtional limits    Expression Expression Primary Mode of Expression: Verbal Verbal Expression Overall Verbal Expression: Impaired Initiation: No impairment Level of Generative/Spontaneous Verbalization: Conversation Repetition: No impairment Naming: Impairment Responsive: 76-100% accurate Confrontation: Impaired Convergent: 75-100% accurate Divergent: 75-100% accurate Pragmatics: No impairment   Oral / Motor  Oral Motor/Sensory Function Overall Oral Motor/Sensory Function: Within functional limits Motor Speech Overall Motor Speech: Appears within functional limits for tasks assessed           Ferdinand Lango MA, CCC-SLP  Marissa Dodson 10/11/2021, 12:03 PM

## 2021-10-11 NOTE — Progress Notes (Signed)
PT Cancellation Note  Patient Details Name: Marissa Dodson MRN: [id number] DOB: 08-03-1940   Cancelled Treatment:    Reason Eval/Treat Not Completed: Patient at procedure or test/unavailable. Checked on pt an additional 2 times after morning attempt. Pt with SLP and then with EEG. Will continue to follow and initiate PT evaluation when pt is available.    Marylynn Pearson 10/11/2021, 3:03 PM  Conni Slipper, PT, DPT Acute Rehabilitation Services Secure Chat Preferred Office: [phone]

## 2021-10-11 NOTE — Assessment & Plan Note (Signed)
Pt has bradycardia with HR in the 50s. Will hold HTN meds for now and allow for permissive HTN until results of MRI brain are known.

## 2021-10-11 NOTE — Procedures (Signed)
Patient Name: Marissa Dodson  MRN: [id number]  Epilepsy Attending: Charlsie Quest  Referring Physician/Provider: Erick Blinks, MD Date: 10/11/2021 Duration: 22.40 mins  Patient history: 81yo F with aphasia. EEG to evaluate for seizure  Level of alertness: Awake, asleep  AEDs during EEG study: None  Technical aspects: This EEG study was done with scalp electrodes positioned according to the 10-20 International system of electrode placement. Electrical activity was reviewed with band pass filter of 1-70Hz , sensitivity of 7 uV/mm, display speed of 57mm/sec with a 60Hz  notched filter applied as appropriate. EEG data were recorded continuously and digitally stored.  Video monitoring was available and reviewed as appropriate.  Description: The posterior dominant rhythm consists of 8-9 Hz activity of moderate voltage (25-35 uV) seen predominantly in posterior head regions, symmetric and reactive to eye opening and eye closing. Sleep was characterized by vertex waves, sleep spindles (12 to 14 Hz), maximal frontocentral region. EEG showed intermittent generalized and maximal left>right temporal region 3 to 6 Hz theta-delta slowing. Hyperventilation and photic stimulation were not performed.     ABNORMALITY - Intermittent slow, generalized and maximal left>right temporal region  IMPRESSION: This study is suggestive of suggestive of cortical dysfunction arising from left> right temporal region. nonspecific etiology. Additionally there is mild diffuse encephalopathy, nonspecific etiology. No seizures or epileptiform discharges were seen throughout the recording.  [name] 

## 2021-10-11 NOTE — Progress Notes (Signed)
EEG complete - results pending 

## 2021-10-11 NOTE — Progress Notes (Signed)
Echocardiogram 2D Echocardiogram has been performed.  [name] 10/11/2021, 10:37 AM

## 2021-10-11 NOTE — Progress Notes (Signed)
Patient not available for EEG, having imaging performed, will check back for EEG

## 2021-10-11 NOTE — Plan of Care (Signed)
  Problem: Education: Goal: Knowledge of General Education information will improve Description: Including pain rating scale, medication(s)/side effects and non-pharmacologic comfort measures Outcome: Progressing   Problem: Health Behavior/Discharge Planning: Goal: Ability to manage health-related needs will improve Outcome: Progressing   Problem: Clinical Measurements: Goal: Ability to maintain clinical measurements within normal limits will improve Outcome: Progressing Goal: Will remain free from infection Outcome: Progressing Goal: Diagnostic test results will improve Outcome: Progressing Goal: Respiratory complications will improve Outcome: Progressing Goal: Cardiovascular complication will be avoided Outcome: Progressing   Problem: Activity: Goal: Risk for activity intolerance will decrease Outcome: Progressing   Problem: Nutrition: Goal: Adequate nutrition will be maintained Outcome: Progressing   Problem: Coping: Goal: Level of anxiety will decrease Outcome: Progressing   Problem: Elimination: Goal: Will not experience complications related to bowel motility Outcome: Progressing Goal: Will not experience complications related to urinary retention Outcome: Progressing   Problem: Pain Managment: Goal: General experience of comfort will improve Outcome: Progressing   Problem: Safety: Goal: Ability to remain free from injury will improve Outcome: Progressing   Problem: Skin Integrity: Goal: Risk for impaired skin integrity will decrease Outcome: Progressing   Problem: Education: Goal: Knowledge of disease or condition will improve Outcome: Progressing Goal: Knowledge of secondary prevention will improve (SELECT ALL) Outcome: Progressing Goal: Knowledge of patient specific risk factors will improve (INDIVIDUALIZE FOR PATIENT) Outcome: Progressing Goal: Individualized Educational Video(s) Outcome: Progressing   Problem: Ischemic Stroke/TIA Tissue  Perfusion: Goal: Complications of ischemic stroke/TIA will be minimized Outcome: Progressing   

## 2021-10-11 NOTE — Progress Notes (Signed)
Received on stretcher from [facility].  Oriented to room and surroundings.  Given pt handbook detailing rights, responsibilities, and visitor guidelines.

## 2021-10-11 NOTE — Progress Notes (Signed)
Carotid artery duplex completed. Refer to "CV Proc" under chart review to view preliminary results.  10/11/2021 10:35 AM Eula Fried., MHA, RVT, RDCS, RDMS

## 2021-10-11 NOTE — Progress Notes (Addendum)
STROKE TEAM PROGRESS NOTE   INTERVAL HISTORY No one at the bedside.  Patient in bed resting comfortable. Wants answers.  EEG  is pending. MRI: negative for stroke.  Vitals:   10/10/21 2300 10/11/21 0027 10/11/21 0318 10/11/21 0746  BP: (!) 111/57 (!) 145/66 (!) 139/53 (!) 128/58  Pulse: 72 68 66 73  Resp: 20 16 18 16   Temp:  98.6 F (37 C) 98.3 F (36.8 C) 98 F (36.7 C)  TempSrc:  Oral Oral Oral  SpO2: 96% 96% 96% 98%  Weight:      Height:       CBC:  Recent Labs  Lab 10/10/21 1655  WBC 9.2  NEUTROABS 5.9  HGB 14.2  HCT 43.3  MCV 89.1  PLT 314   Basic Metabolic Panel:  Recent Labs  Lab 10/10/21 1655  NA 138  K 3.8  CL 99  CO2 29  GLUCOSE 96  BUN 19  CREATININE 1.05*  CALCIUM 9.2   Lipid Panel:  Recent Labs  Lab 10/11/21 0526  CHOL 191  TRIG 79  HDL 61  CHOLHDL 3.1  VLDL 16  LDLCALC 10/13/21*   HgbA1c:  Recent Labs  Lab 10/11/21 0526  HGBA1C 5.6   Urine Drug Screen:  Recent Labs  Lab 10/10/21 1536  LABOPIA NONE DETECTED  COCAINSCRNUR NONE DETECTED  LABBENZ NONE DETECTED  AMPHETMU NONE DETECTED  THCU NONE DETECTED  LABBARB NONE DETECTED    Alcohol Level  Recent Labs  Lab 10/10/21 1655  ETH <10    IMAGING past 24 hours MR ANGIO HEAD WO CONTRAST  Result Date: 10/11/2021 CLINICAL DATA:  81 year old female with intermittent abnormal speech and word-finding difficulty since late July. EXAM: MRA HEAD WITHOUT CONTRAST TECHNIQUE: Angiographic images of the Circle of Willis were acquired using MRA technique without intravenous contrast. COMPARISON:  Brain MRI today. FINDINGS: Motion degraded exam. Anterior circulation: Motion artifact degrades detail of both ICA siphons which appear to remain patent with antegrade flow bilaterally. Patent carotid termini, MCA and ACA origins. MCA M1 segments and bifurcations are patent although some vessel detail is degraded. When allowing for motion artifact the visible MCA and ACA branches are within normal  limits. Posterior circulation: Antegrade flow in the posterior circulation. Distal vertebral arteries and basilar artery to the distal basilar appear patent without stenosis. Both PICA origins are patent. Motion artifact felt responsible for irregularity of the distal basilar artery proximal to the tip (series 1030, image 9). SCA and PCA origins remain patent. Right posterior communicating artery is visible, the left is diminutive or absent. Bilateral PCA branches are within normal limits. Anatomic variants: None. Other: Brain MRI today reported separately. IMPRESSION: Negative for age intracranial MRA when allowing for motion artifact. Electronically Signed   By: August M.D.   On: 10/11/2021 05:30   MR BRAIN WO CONTRAST  Result Date: 10/11/2021 CLINICAL DATA:  81 year old female with intermittent abnormal speech and word-finding difficulty since late July. EXAM: MRI HEAD WITHOUT CONTRAST TECHNIQUE: Multiplanar, multiecho pulse sequences of the brain and surrounding structures were obtained without intravenous contrast. COMPARISON:  Head CT 10/10/2021.  Brain MRI 08/24/2016. FINDINGS: Brain: Cerebral volume has not significantly changed since 2018 and appears normal for age. No restricted diffusion to suggest acute infarction. No midline shift, mass effect, evidence of mass lesion, ventriculomegaly, extra-axial collection or acute intracranial hemorrhage. Cervicomedullary junction and pituitary are within normal limits. 2019 and white matter signal appears stable since 2018 and within normal limits for  age. No cortical encephalomalacia or chronic cerebral blood products identified. Deep gray matter nuclei, brainstem, and cerebellum appear negative. Vascular: Major intracranial vascular flow voids are stable. Skull and upper cervical spine: Negative. Visualized bone marrow signal is within normal limits. Negative for age visible cervical spine. Sinuses/Orbits: Postoperative changes to both globes since 2018.  Paranasal sinuses and mastoids are stable and well aerated. Other: Visible scalp and face appear negative. IMPRESSION: No acute intracranial abnormality. Normal for age noncontrast MRI appearance of the brain, stable since 2018. Electronically Signed   By: Odessa Fleming M.D.   On: 10/11/2021 05:13   CT HEAD WO CONTRAST  Result Date: 10/10/2021 CLINICAL DATA:  Altered mental status EXAM: CT HEAD WITHOUT CONTRAST TECHNIQUE: Contiguous axial images were obtained from the base of the skull through the vertex without intravenous contrast. RADIATION DOSE REDUCTION: This exam was performed according to the departmental dose-optimization program which includes automated exposure control, adjustment of the mA and/or kV according to patient size and/or use of iterative reconstruction technique. COMPARISON:  MR brain done on 08/24/2016 FINDINGS: Brain: No acute intracranial findings are seen. There are no signs of bleeding within the cranium. Cortical sulci are prominent. Ventricles are not dilated. Vascular: Unremarkable. Skull: Unremarkable. Sinuses/Orbits: Unremarkable. Other: None. IMPRESSION: No acute intracranial findings are seen in noncontrast CT brain. Atrophy. Electronically Signed   By: Ernie Avena M.D.   On: 10/10/2021 17:33    PHYSICAL EXAM Physical Exam  Constitutional: Appears well-developed and well-nourished.  Psych: Affect appropriate to situation Eyes: Normal external eye and conjunctiva. HENT: Normocephalic, no lesions, without obvious abnormality.   Musculoskeletal-no joint tenderness, deformity or swelling Cardiovascular: Normal rate and regular rhythm.  Respiratory: Effort normal, non-labored breathing saturations WNL GI: Soft.  No distension. There is no tenderness.  Skin: WDI   Neuro:  Mental Status: Alert, oriented, thought content appropriate.  Speech fluent without evidence of aphasia.  Able to follow commands without difficulty. Cranial Nerves: II:  Visual fields grossly  normal,  III,IV, VI: ptosis not present, extra-ocular motions intact bilaterally pupils equal, round, reactive to light and accommodation V,VII: smile asymmetric, slight left facial droop,  facial light touch sensation normal bilaterally VIII: hearing normal bilaterally IX,X: uvula rises symmetrically XI: bilateral shoulder shrug XII: midline tongue extension Motor: Right : Upper extremity   5/5  Left:     Upper extremity   5/5  Lower extremity   5/5   Lower extremity   5/5 Tone and bulk:normal tone throughout; no atrophy noted Sensory:  light touch intact throughout, bilaterally Cerebellar: FNF intact Gait: deferred     ASSESSMENT/PLAN Marissa Dodson is a 81 y.o. female with PMH significant for HTN, obesity, DM2, GERD, Sleep apnea who presents with intermittent slurred speech and word finding difficulty.   Patient reports back in late July, she was reading her Bible when she had difficulty coming up with words in her head.  She would make errors and substitute them for other words.  She reports that her pronunciation was intact and denies any slurring of her speech.  Reports that back in July, this was persistent for 4 to 5 days.  She also reports that around that time, she noted that serum will drift from the right side of her mouth.  She tells me that since then, she has noted that whenever she is tired or lethargic or stressed out, she has trouble finding words but not as bad as back in July and also feels that her speech slurs.  Most recently she describes that she moved about 2 weeks ago because she got worried about living by herself with these episodes.  She noted that she had a 10-minute episode of word finding difficulty.  The last time she had this episode was yesterday.   She got worried and so she talked to her daughter and encouraged to come to the ED.  Also reports that she has a bruise/bleed on her left lower extremity that has been present for about a week now.  She does  not recall hitting her leg on anything.   No prior history of strokes, sister had 2 strokes, mother had atrial fibrillation.  Does not smoke, does not drink alcohol, does not use any recreational substances.  Reports his diabetes is well controlled and so is her blood pressure  TIA vs seizure  CT head No acute abnormality. No hemorrhage MRI  no acute abnormality MRA  : no LVO Carotid Doppler  no LVO; RICA 1-39% stenosis 2D Echo pending EEG: pending LDL 114 HgbA1c 5.6 VTE prophylaxis - heparin    Diet   Diet Heart Room service appropriate? Yes; Fluid consistency: Thin   aspirin 81 mg daily prior to admission, now on aspirin 81 mg daily.  Therapy recommendations:  pending Disposition:  pending  Hypertension Home meds:  atenolol Stable Permissive hypertension (OK if < 220/120) but gradually normalize in 5-7 days Long-term BP goal normotensive  Hyperlipidemia Home meds:  none  LDL 114, goal < 70 Add crestor 20mg  daily Continue statin at discharge  Diabetes type II Controlled (no diagnosis) Home meds:  none HgbA1c 5.6, goal < 7.0 CBGs Recent Labs    10/10/21 1653  GLUCAP 95     Other Stroke Risk Factors Advanced Age >/= 72  Obesity, Body mass index is 37.55 kg/m., BMI >/= 30 associated with increased stroke risk, recommend weight loss, diet and exercise as appropriate  Obstructive sleep apnea, not on CPAP at home Congestive heart failure   Hospital day # 0  76, MSN, NP-C [facility] See AMION or use Epic Chat  ATTENDING ATTESTATION:  81 year old with episode of slurred speech that is now resolved.  NIH stroke scale 0.  She is back to her baseline.  This is likely not a TIA and may be related to either anxiety or other transient event.  MRI negative for stroke.  EEG shows cortical dysfunction L>R. Will attempt to get overnight VEEG. Continue aspirin 81 mg  Dr. 96 evaluated pt independently, reviewed imaging, chart, labs.  Discussed and formulated plan with the Resident/APP. Changes were made to the note where appropriate. Please see APP/resident note above for details.     Hillary Struss,MD   To contact Stroke Continuity provider, please refer to Viviann Spare. After hours, contact General Neurology

## 2021-10-11 NOTE — Assessment & Plan Note (Signed)
Admit to observation telemetry bed.  Standard TIA work-up with MRI brain, carotid ultrasound, echocardiogram.  Patient failed her finger-nose on both sides.  Negative Romberg.  Concerned she may have had an occipital stroke.  Check lipid panel.  Start aspirin 81 mg daily.  Neurology notified.

## 2021-10-11 NOTE — Progress Notes (Signed)
OT Cancellation Note  Patient Details Name: Marissa Dodson MRN: [id number] DOB: [birth year]   Cancelled Treatment:    Reason Eval/Treat Not Completed: Patient at procedure or test/ unavailable;Other (comment) (EEG in room and reporting they need an hour) Will return as schedule allows   Ladene Artist, OTR/L [facility] Acute Rehabilitation Office: [phone]   Drue Novel 10/11/2021, 2:20 PM

## 2021-10-11 NOTE — Evaluation (Signed)
Occupational Therapy Evaluation Patient Details Name: Marissa Dodson MRN: [id number] DOB: 14-Oct-1940 Today's Date: 10/11/2021   History of Present Illness Pt is an 81 y/o female who presents 10/10/2021 with 2 week history of intermittent aphasia and confusion. MRI negative for acute changes. PMH significant for L knee meniscal injury (no date noted), angioedema, diverticulitis, HTN, interstitial cystitis, decreased memory, B knee arthroscopy, R THA 2022.   Clinical Impression   PTA, pt lived with her daughter and reports she was independent in ADL, light IADL, and driving. Upon eval, pt with decreased awareness, safety, problem solving, expressive language, and balance. Pt performing LB ADL with min guard and UB ADL with set-up. Pt performing ambulatory toilet transfer with min guard A and washing hands at sink with min guard A. Pt turning to look at therapist after washing hands and with LOB requiring min A for recovery. Pt with difficulty with problem solving during novel task and with awareness of difficulty as it arose. Recommending that someone supervise pt with medication and financial management upon return home and pt agreeable. Pt with greater awareness of current level of function after session and agreeable to OP OT to optimize safety and independence with ADL and IADL.      Recommendations for follow up therapy are one component of a multi-disciplinary discharge planning process, led by the attending physician.  Recommendations may be updated based on patient status, additional functional criteria and insurance authorization.   Follow Up Recommendations  Outpatient OT    Assistance Recommended at Discharge Intermittent Supervision/Assistance  Patient can return home with the following A little help with walking and/or transfers;A little help with bathing/dressing/bathroom;Assistance with cooking/housework;Direct supervision/assist for financial management;Assist for  transportation;Help with stairs or ramp for entrance    Functional Status Assessment  Patient has had a recent decline in their functional status and demonstrates the ability to make significant improvements in function in a reasonable and predictable amount of time.  Equipment Recommendations  BSC/3in1    Recommendations for Other Services PT consult     Precautions / Restrictions Precautions Precautions: Fall Restrictions Weight Bearing Restrictions: No      Mobility Bed Mobility Overal bed mobility: Needs Assistance Bed Mobility: Supine to Sit, Sit to Supine     Supine to sit: Supervision Sit to supine: Supervision   General bed mobility comments: for safety    Transfers Overall transfer level: Needs assistance Equipment used: Straight cane Transfers: Sit to/from Stand Sit to Stand: Min guard           General transfer comment: min guard A for safety      Balance Overall balance assessment: Mild deficits observed, not formally tested                                         ADL either performed or assessed with clinical judgement   ADL Overall ADL's : Needs assistance/impaired Eating/Feeding: Modified independent   Grooming: Wash/dry hands;Min guard;Standing;Minimal assistance Grooming Details (indicate cue type and reason): Min guard. Turning to talk to therapist and with loss of balance requiring min A to recover. Upper Body Bathing: Set up;Sitting   Lower Body Bathing: Sit to/from stand;Min guard   Upper Body Dressing : Set up;Sitting   Lower Body Dressing: Min guard;Sit to/from stand   Toilet Transfer: Min guard;Ambulation;Regular Toilet;Grab bars (cane) Toilet Transfer Details (indicate cue type and reason): Min guard A  for safety. Pt additionally with max confusion given directional cues for which way to turn to avoid tanglig leads/lines Toileting- Clothing Manipulation and Hygiene: Sitting/lateral lean;Set up        Functional mobility during ADLs: Min guard;Cane General ADL Comments: Pt with decreased balance, problem solving, hand-eye coordination, and expressive language     Vision Baseline Vision/History: 1 Wears glasses Ability to See in Adequate Light: 0 Adequate Patient Visual Report: No change from baseline Vision Assessment?: Yes Ocular Range of Motion: Within Functional Limits Tracking/Visual Pursuits:  (Decreased visual attention with eyes frequently returning to midline) Depth Perception: Undershoots (Slight undershooting) Additional Comments: for reading     Perception     Praxis      Pertinent Vitals/Pain Pain Assessment Pain Assessment: Faces Faces Pain Scale: Hurts little more Pain Location: headache Pain Descriptors / Indicators: Headache Pain Intervention(s): Limited activity within patient's tolerance, Monitored during session, Repositioned, Relaxation     Hand Dominance Right   Extremity/Trunk Assessment Upper Extremity Assessment Upper Extremity Assessment: Overall WFL for tasks assessed (4+/5 BUE. R stronger than L; appropriate for being R handed)   Lower Extremity Assessment Lower Extremity Assessment: Defer to PT evaluation       Communication Communication Communication: No difficulties (mild expressive difficulty)   Cognition Arousal/Alertness: Awake/alert Behavior During Therapy: WFL for tasks assessed/performed Overall Cognitive Status: Impaired/Different from baseline Area of Impairment: Attention, Following commands, Safety/judgement, Awareness, Problem solving                   Current Attention Level: Selective   Following Commands: Follows one step commands consistently, Follows one step commands with increased time, Follows multi-step commands inconsistently Safety/Judgement: Decreased awareness of safety, Decreased awareness of deficits Awareness: Emergent Problem Solving: Slow processing General Comments: Pt performing BADL with  increased time. With decreased awareness of safety and deficits. With skill to recognize deficits as they are occurring (LOB, and pt apologizing to therapist, and able to reason with therapist to agree to OP OT following cognitive screen). Pt with tangential conversation, highly distractable by presence of RN, and moderate difficulty with problem solving/naming task with therapist naming word that begins with A, pt naming word that begings with B, and so on. Pt with difficulty organizing and determining which letter is next, but min difficulty actually finding appropriate word. Pt also with cognitive fatigue and asking for break.     General Comments  HR elevating to 120 coming to EOB and requiring ~45 seconds for recovery    Exercises     Shoulder Instructions      Home Living Family/patient expects to be discharged to:: Private residence Living Arrangements: Children Available Help at Discharge: Other (Comment);Friend(s);Family;Available PRN/intermittently (grandchildren, children) Type of Home: Apartment Home Access: Stairs to enter [employer] of Steps: 15 Entrance Stairs-Rails: Right;Left;Can reach both Home Layout: One level     Bathroom Shower/Tub: Chief Strategy Officer: Standard Bathroom Accessibility: No   Home Equipment: Cane - single point;Rolling Walker (2 wheels);Grab bars - tub/shower          Prior Functioning/Environment Prior Level of Function : Independent/Modified Independent;Driving             Mobility Comments: cane ADLs Comments: Moved in with daughter due to difficulty performing home management alone, but reports she still helps out        OT Problem List: Decreased activity tolerance;Impaired balance (sitting and/or standing);Decreased strength;Decreased cognition;Decreased knowledge of use of DME or AE;Decreased safety awareness;Pain  OT Treatment/Interventions: Self-care/ADL training;Therapeutic exercise;DME and/or  AE instruction;Therapeutic activities;Cognitive remediation/compensation;Patient/family education;Balance training;Visual/perceptual remediation/compensation    OT Goals(Current goals can be found in the care plan section) Acute Rehab OT Goals Patient Stated Goal: Take a nap OT Goal Formulation: With patient Time For Goal Achievement: 10/25/21 Potential to Achieve Goals: Good  OT Frequency: Min 2X/week    Co-evaluation              AM-PAC OT "6 Clicks" Daily Activity     Outcome Measure Help from another person eating meals?: None Help from another person taking care of personal grooming?: A Little Help from another person toileting, which includes using toliet, bedpan, or urinal?: A Little Help from another person bathing (including washing, rinsing, drying)?: A Little Help from another person to put on and taking off regular upper body clothing?: A Little Help from another person to put on and taking off regular lower body clothing?: A Little 6 Click Score: 19   End of Session Equipment Utilized During Treatment: Gait belt (cane) Nurse Communication: Mobility status;Other (comment) (RN collaborating with OT due to feeling like cognition seems different than anticipated baseline)  Activity Tolerance: Patient tolerated treatment well Patient left: in bed;with call bell/phone within reach;with bed alarm set  OT Visit Diagnosis: Unsteadiness on feet (R26.81);Muscle weakness (generalized) (M62.81);Other abnormalities of gait and mobility (R26.89);Other symptoms and signs involving cognitive function                Time: 1528-1600 OT Time Calculation (min): 32 min Charges:  OT General Charges $OT Visit: 1 Visit OT Evaluation $OT Eval Moderate Complexity: 1 Mod OT Treatments $Self Care/Home Management : 8-22 mins  Ladene Artist, OTR/L [facility] Acute Rehabilitation Office: [phone]   Drue Novel 10/11/2021, 4:15 PM

## 2021-10-12 ENCOUNTER — Observation Stay (HOSPITAL_COMMUNITY): Payer: Medicare HMO

## 2021-10-12 DIAGNOSIS — E119 Type 2 diabetes mellitus without complications: Secondary | ICD-10-CM | POA: Diagnosis present

## 2021-10-12 DIAGNOSIS — R001 Bradycardia, unspecified: Secondary | ICD-10-CM | POA: Diagnosis present

## 2021-10-12 DIAGNOSIS — Z833 Family history of diabetes mellitus: Secondary | ICD-10-CM | POA: Diagnosis not present

## 2021-10-12 DIAGNOSIS — Z79899 Other long term (current) drug therapy: Secondary | ICD-10-CM | POA: Diagnosis not present

## 2021-10-12 DIAGNOSIS — K219 Gastro-esophageal reflux disease without esophagitis: Secondary | ICD-10-CM | POA: Diagnosis present

## 2021-10-12 DIAGNOSIS — G459 Transient cerebral ischemic attack, unspecified: Secondary | ICD-10-CM | POA: Diagnosis present

## 2021-10-12 DIAGNOSIS — Z8249 Family history of ischemic heart disease and other diseases of the circulatory system: Secondary | ICD-10-CM | POA: Diagnosis not present

## 2021-10-12 DIAGNOSIS — Z820 Family history of epilepsy and other diseases of the nervous system: Secondary | ICD-10-CM | POA: Diagnosis not present

## 2021-10-12 DIAGNOSIS — R569 Unspecified convulsions: Secondary | ICD-10-CM | POA: Diagnosis present

## 2021-10-12 DIAGNOSIS — I10 Essential (primary) hypertension: Secondary | ICD-10-CM | POA: Diagnosis present

## 2021-10-12 DIAGNOSIS — Z96641 Presence of right artificial hip joint: Secondary | ICD-10-CM | POA: Diagnosis present

## 2021-10-12 DIAGNOSIS — Z90722 Acquired absence of ovaries, bilateral: Secondary | ICD-10-CM | POA: Diagnosis not present

## 2021-10-12 DIAGNOSIS — Z881 Allergy status to other antibiotic agents status: Secondary | ICD-10-CM | POA: Diagnosis not present

## 2021-10-12 DIAGNOSIS — Z888 Allergy status to other drugs, medicaments and biological substances status: Secondary | ICD-10-CM | POA: Diagnosis not present

## 2021-10-12 DIAGNOSIS — Z87891 Personal history of nicotine dependence: Secondary | ICD-10-CM | POA: Diagnosis not present

## 2021-10-12 DIAGNOSIS — Z825 Family history of asthma and other chronic lower respiratory diseases: Secondary | ICD-10-CM | POA: Diagnosis not present

## 2021-10-12 DIAGNOSIS — G9340 Encephalopathy, unspecified: Secondary | ICD-10-CM | POA: Diagnosis present

## 2021-10-12 DIAGNOSIS — R471 Dysarthria and anarthria: Secondary | ICD-10-CM | POA: Diagnosis present

## 2021-10-12 DIAGNOSIS — E785 Hyperlipidemia, unspecified: Secondary | ICD-10-CM | POA: Diagnosis present

## 2021-10-12 DIAGNOSIS — R4701 Aphasia: Secondary | ICD-10-CM | POA: Diagnosis present

## 2021-10-12 DIAGNOSIS — Z8042 Family history of malignant neoplasm of prostate: Secondary | ICD-10-CM | POA: Diagnosis not present

## 2021-10-12 DIAGNOSIS — E669 Obesity, unspecified: Secondary | ICD-10-CM | POA: Diagnosis present

## 2021-10-12 DIAGNOSIS — Z885 Allergy status to narcotic agent status: Secondary | ICD-10-CM | POA: Diagnosis not present

## 2021-10-12 DIAGNOSIS — Z90710 Acquired absence of both cervix and uterus: Secondary | ICD-10-CM | POA: Diagnosis not present

## 2021-10-12 LAB — CBC
HCT: 42.5 % (ref 36.0–46.0)
Hemoglobin: 14.1 g/dL (ref 12.0–15.0)
MCH: 29.4 pg (ref 26.0–34.0)
MCHC: 33.2 g/dL (ref 30.0–36.0)
MCV: 88.5 fL (ref 80.0–100.0)
Platelets: 312 10*3/uL (ref 150–400)
RBC: 4.8 MIL/uL (ref 3.87–5.11)
RDW: 13.2 % (ref 11.5–15.5)
WBC: 8 10*3/uL (ref 4.0–10.5)
nRBC: 0 % (ref 0.0–0.2)

## 2021-10-12 LAB — TROPONIN I (HIGH SENSITIVITY)
Troponin I (High Sensitivity): 4 ng/L (ref <18)
Troponin I (High Sensitivity): 4 ng/L (ref <18)

## 2021-10-12 LAB — GLUCOSE, CAPILLARY: Glucose-Capillary: 89 mg/dL (ref 70–99)

## 2021-10-12 NOTE — Progress Notes (Addendum)
STROKE TEAM PROGRESS NOTE   INTERVAL HISTORY There is no family at bedside. Patient is resting comfortably in bed.  Routine EEG suggestive of left > right cortical dysfunction in the temporal region, overnight monitoring initiated. Will hold on antiepileptic medications due to no evidence of seizure at this time.  MRI: negative for stroke.  Vitals:   10/11/21 1652 10/11/21 2300 10/12/21 0359 10/12/21 0742  BP: (!) 137/57 (!) 140/60 129/72   Pulse: 72 72 80 66  Resp: 16     Temp: 97.8 F (36.6 C) 98 F (36.7 C) 98.5 F (36.9 C) 97.8 F (36.6 C)  TempSrc: Oral Oral Oral Oral  SpO2: 98% 97% 98% 100%  Weight:      Height:       CBC:  Recent Labs  Lab 10/10/21 1655  WBC 9.2  NEUTROABS 5.9  HGB 14.2  HCT 43.3  MCV 89.1  PLT 314    Basic Metabolic Panel:  Recent Labs  Lab 10/10/21 1655  NA 138  K 3.8  CL 99  CO2 29  GLUCOSE 96  BUN 19  CREATININE 1.05*  CALCIUM 9.2    Lipid Panel:  Recent Labs  Lab 10/11/21 0526  CHOL 191  TRIG 79  HDL 61  CHOLHDL 3.1  VLDL 16  LDLCALC 409*    HgbA1c:  Recent Labs  Lab 10/11/21 0526  HGBA1C 5.6    Urine Drug Screen:  Recent Labs  Lab 10/10/21 1536  LABOPIA NONE DETECTED  COCAINSCRNUR NONE DETECTED  LABBENZ NONE DETECTED  AMPHETMU NONE DETECTED  THCU NONE DETECTED  LABBARB NONE DETECTED     Alcohol Level  Recent Labs  Lab 10/10/21 1655  ETH <10    IMAGING past 24 hours EEG adult  Result Date: 10/11/2021 Charlsie Quest, MD     10/11/2021  4:29 PM Patient Name: DANNETTA LEKAS MRN: [id number] Epilepsy Attending: Charlsie Quest Referring Physician/Provider: Erick Blinks, MD Date: 10/11/2021 Duration: 22.40 mins Patient history: 81yo F with aphasia. EEG to evaluate for seizure Level of alertness: Awake, asleep AEDs during EEG study: None Technical aspects: This EEG study was done with scalp electrodes positioned according to the 10-20 International system of electrode placement. Electrical  activity was reviewed with band pass filter of 1-70Hz , sensitivity of 7 uV/mm, display speed of 66mm/sec with a  notched filter applied as appropriate. EEG data were recorded continuously and digitally stored.  Video monitoring was available and reviewed as appropriate. Description: The posterior dominant rhythm consists of 8-9 Hz activity of moderate voltage (25-35 uV) seen predominantly in posterior head regions, symmetric and reactive to eye opening and eye closing. Sleep was characterized by vertex waves, sleep spindles (12 to 14 Hz), maximal frontocentral region. EEG showed intermittent generalized and maximal left>right temporal region 3 to 6 Hz theta-delta slowing. Hyperventilation and photic stimulation were not performed.   ABNORMALITY - Intermittent slow, generalized and maximal left>right temporal region IMPRESSION: This study is suggestive of suggestive of cortical dysfunction arising from left> right temporal region. nonspecific etiology. Additionally there is mild diffuse encephalopathy, nonspecific etiology. No seizures or epileptiform discharges were seen throughout the recording. Priyanka O Yadav   VAS US CAROTID (at [facility] and [facility] only)  Result Date: 10/11/2021 Carotid Arterial Duplex Study Patient Name:  GENIENE LIST  Date of Exam:   10/11/2021 Medical Rec #: [id number]          Accession #:    [id number] Date of Birth: [birth year]  Patient Gender: F Patient Age:   81 years [facility]  Procedure:      VAS US CAROTID Referring Phys: ERIC CHEN --------------------------------------------------------------------------------  Indications:       TIA. Risk Factors:      Hypertension. Comparison Study:  No prior study Performing Technologist: Gertie FeyMichelle Simonetti MHA, RDMS, RVT, RDCS  Examination Guidelines: A complete evaluation includes B-mode imaging, spectral Doppler, color Doppler, and power Doppler as needed of all accessible portions of each vessel. Bilateral  testing is considered an integral part of a complete examination. Limited examinations for reoccurring indications may be performed as noted.  Right Carotid Findings: +----------+--------+--------+--------+-----------------------+--------+           PSV cm/sEDV cm/sStenosisPlaque Description     Comments +----------+--------+--------+--------+-----------------------+--------+ CCA Prox  107     19                                              +----------+--------+--------+--------+-----------------------+--------+ CCA Distal69      12              smooth and heterogenous         +----------+--------+--------+--------+-----------------------+--------+ ICA Prox  75      24              smooth and heterogenous         +----------+--------+--------+--------+-----------------------+--------+ ICA Distal79      25                                              +----------+--------+--------+--------+-----------------------+--------+ ECA       85                      heterogenous                    +----------+--------+--------+--------+-----------------------+--------+ +----------+--------+-------+----------------+-------------------+           PSV cm/sEDV cmsDescribe        Arm Pressure (mmHG) +----------+--------+-------+----------------+-------------------+ [id number]      50     Multiphasic, WNL                    +----------+--------+-------+----------------+-------------------+ +---------+--------+--+--------+--+---------+ VertebralPSV cm/s41EDV cm/s11Antegrade +---------+--------+--+--------+--+---------+  Left Carotid Findings: +----------+--------+--------+--------+------------------+------------------+           PSV cm/sEDV cm/sStenosisPlaque DescriptionComments           +----------+--------+--------+--------+------------------+------------------+ CCA Prox  110     25                                intimal thickening  +----------+--------+--------+--------+------------------+------------------+ CCA Distal95      20                                                   +----------+--------+--------+--------+------------------+------------------+ ICA Prox  54      17                                                   +----------+--------+--------+--------+------------------+------------------+  ICA Distal65      18                                tortuous           +----------+--------+--------+--------+------------------+------------------+ ECA       106     9                                                    +----------+--------+--------+--------+------------------+------------------+ +----------+--------+--------+----------------+-------------------+           PSV cm/sEDV cm/sDescribe        Arm Pressure (mmHG) +----------+--------+--------+----------------+-------------------+ [id number]             Multiphasic, WNL                    +----------+--------+--------+----------------+-------------------+ +---------+--------+--+--------+-+---------+ VertebralPSV cm/s39EDV cm/s9Antegrade +---------+--------+--+--------+-+---------+   Summary: Right Carotid: Velocities in the right ICA are consistent with a 1-39% stenosis. Left Carotid: The extracranial vessels were near-normal with only minimal wall               thickening or plaque. Vertebrals:  Bilateral vertebral arteries demonstrate antegrade flow. Subclavians: Normal flow hemodynamics were seen in bilateral subclavian              arteries. *See table(s) above for measurements and observations.  Electronically signed by Heath Lark on 10/11/2021 at 4:11:51 PM.    Final    ECHOCARDIOGRAM COMPLETE  Result Date: 10/11/2021    ECHOCARDIOGRAM REPORT   Patient Name:   SONI KEGEL Date of Exam: 10/11/2021 Medical Rec #:  [id number]         Height:       63.0 in Accession #:    [id number]        Weight:       212.0 lb Date of Birth:   August 29, 1940         BSA:          1.982 m Patient Age:    81 years          BP:           139/53 mmHg Patient Gender: F                 HR:           61 bpm. Exam Location:  Inpatient Procedure: 2D Echo, Cardiac Doppler, Color Doppler and Intracardiac            Opacification Agent Indications:    TIA G45.9  History:        Patient has prior history of Echocardiogram examinations, most                 recent 08/31/2019. Signs/Symptoms:Dyspnea; Risk Factors:Sleep                 Apnea, Hypertension and GERD.  Sonographer:    Aron Baba Referring Phys: [phone] ERIC CHEN  Sonographer Comments: Technically difficult study due to poor echo windows. IMPRESSIONS  1. Left ventricular ejection fraction, by estimation, is 70 to 75%. Left ventricular ejection fraction by 2D MOD biplane is 73.2 %. The left ventricle has hyperdynamic function. The left ventricle has no regional wall motion abnormalities. There is mild  left ventricular hypertrophy. Left ventricular diastolic parameters are  consistent with Grade I diastolic dysfunction (impaired relaxation).  2. Right ventricular systolic function is normal. The right ventricular size is normal. There is normal pulmonary artery systolic pressure. The estimated right ventricular systolic pressure is 17.7 mmHg.  3. The mitral valve was not well visualized. No evidence of mitral valve regurgitation.  4. The aortic valve is tricuspid. Aortic valve regurgitation is not visualized.  5. The inferior vena cava is normal in size with greater than 50% respiratory variability, suggesting right atrial pressure of 3 mmHg. Comparison(s): Changes from prior study are noted. 08/31/2019: LVEF 60-65%. FINDINGS  Left Ventricle: Left ventricular ejection fraction, by estimation, is 70 to 75%. Left ventricular ejection fraction by 2D MOD biplane is 73.2 %. The left ventricle has hyperdynamic function. The left ventricle has no regional wall motion abnormalities. Definity contrast agent was given IV to  delineate the left ventricular endocardial borders. The left ventricular internal cavity size was normal in size. There is mild left ventricular hypertrophy. Left ventricular diastolic parameters are consistent with Grade I diastolic dysfunction (impaired relaxation). Indeterminate filling pressures. Right Ventricle: The right ventricular size is normal. No increase in right ventricular wall thickness. Right ventricular systolic function is normal. There is normal pulmonary artery systolic pressure. The tricuspid regurgitant velocity is 1.92 m/s, and  with an assumed right atrial pressure of 3 mmHg, the estimated right ventricular systolic pressure is 17.7 mmHg. Left Atrium: Left atrial size was normal in size. Right Atrium: Right atrial size was normal in size. Pericardium: There is no evidence of pericardial effusion. Mitral Valve: The mitral valve was not well visualized. No evidence of mitral valve regurgitation. Tricuspid Valve: The tricuspid valve is grossly normal. Tricuspid valve regurgitation is trivial. Aortic Valve: The aortic valve is tricuspid. Aortic valve regurgitation is not visualized. Pulmonic Valve: The pulmonic valve was normal in structure. Pulmonic valve regurgitation is not visualized. Aorta: The aortic root and ascending aorta are structurally normal, with no evidence of dilitation. Venous: The inferior vena cava is normal in size with greater than 50% respiratory variability, suggesting right atrial pressure of 3 mmHg. IAS/Shunts: The interatrial septum was not well visualized.  LEFT VENTRICLE PLAX 2D                        Biplane EF (MOD) LVIDd:         3.90 cm         LV Biplane EF:   Left LVIDs:         2.80 cm                          ventricular LV PW:         1.10 cm                          ejection LV IVS:        0.70 cm                          fraction by LVOT diam:     2.00 cm                          2D MOD LV SV:         71  biplane is LV SV Index:    36                               73.2 %. LVOT Area:     3.14 cm                                Diastology                                LV e' medial:    6.83 cm/s LV Volumes (MOD)               LV E/e' medial:  10.1 LV vol d, MOD    51.8 ml       LV e' lateral:   8.67 cm/s A2C:                           LV E/e' lateral: 7.9 LV vol d, MOD    51.5 ml A4C: LV vol s, MOD    11.1 ml A2C: LV vol s, MOD    16.2 ml A4C: LV SV MOD A2C:   40.7 ml LV SV MOD A4C:   51.5 ml LV SV MOD BP:    37.8 ml RIGHT VENTRICLE RV Basal diam:  4.00 cm RV Mid diam:    4.30 cm RV S prime:     14.80 cm/s TAPSE (M-mode): 1.9 cm LEFT ATRIUM             Index        RIGHT ATRIUM           Index LA diam:        4.20 cm 2.12 cm/m   RA Area:     18.60 cm LA Vol (A2C):   55.5 ml 28.00 ml/m  RA Volume:   52.80 ml  26.64 ml/m LA Vol (A4C):   33.1 ml 16.70 ml/m LA Biplane Vol: 43.4 ml 21.90 ml/m  AORTIC VALVE LVOT Vmax:   96.10 cm/s LVOT Vmean:  67.800 cm/s LVOT VTI:    0.226 m  AORTA Ao Root diam: 3.30 cm Ao Asc diam:  3.50 cm MITRAL VALVE               TRICUSPID VALVE MV Area (PHT): 2.90 cm    TR Peak grad:   14.7 mmHg MV Decel Time: 262 msec    TR Vmax:        192.00 cm/s MV E velocity: 68.90 cm/s MV A velocity: 81.50 cm/s  SHUNTS MV E/A ratio:  0.85        Systemic VTI:  0.23 m                            Systemic Diam: 2.00 cm Zoila Shutter MD Electronically signed by Zoila Shutter MD Signature Date/Time: 10/11/2021/1:33:31 PM    Final     PHYSICAL EXAM Physical Exam  Constitutional: Appears well-developed and well-nourished.  Psych: Affect appropriate to situation, calm and cooperative with examination Cardiovascular: Normal rate and regular rhythm on cardiac monitor Respiratory: Effort normal, non-labored breathing saturations WNL Neuro:  Mental Status: Alert, oriented, thought content appropriate.  Speech fluent. Occasionally has trouble with word-finding with naming objects but correctly states 9/10  objects for examiner. She  does have continued subjective word-finding difficulties that are intermittent.  Able to follow commands without difficulty. Cranial Nerves: II:  Visual fields grossly normal III,IV, VI: ptosis not present, EOMI bilaterally pupils equal, round, reactive to light V,VII: Smile asymmetric, facial light touch sensation normal bilaterally VIII: hearing normal bilaterally IX,X: Palate rises symmetrically XI: bilateral shoulder shrug XII: midline tongue extension Motor: Right : Upper extremity   5/5  Left:     Upper extremity   5/5  Lower extremity   5/5   Lower extremity   5/5 Tone and bulk:normal tone throughout; no atrophy noted Sensory:  light touch intact throughout, bilaterally Cerebellar: FNF intact, HKS intact with some limitation due to reported knee pain on the right > left Gait: deferred  ASSESSMENT/PLAN GISEL VIPOND is a 81 y.o. female with PMH significant for HTN, obesity, DM2, GERD, OSA who presents with intermittent slurred speech and word finding difficulty.   Patient reports back in late July, she was reading her Bible when she had difficulty coming up with words in her head.  She would make errors and substitute them for other words.  She reports that her pronunciation was intact and denies any slurring of her speech.  Reports that back in July, this was persistent for 4 to 5 days.  She also reports that around that time, she noted that serum will drift from the right side of her mouth.  She tells me that since then, she has noted that whenever she is tired or lethargic or stressed out, she has trouble finding words but not as bad as back in July and also feels that her speech slurs.  Most recently she describes that she moved about 2 weeks ago because she got worried about living by herself with these episodes.  She noted that she had a 10-minute episode of word finding difficulty.  The last time she had this episode was yesterday.   She got worried and so she talked to her  daughter and encouraged to come to the ED.  Also reports that she has a bruise/bleed on her left lower extremity that has been present for about a week now.  She does not recall hitting her leg on anything.   No prior history of strokes, sister had 2 strokes, mother had atrial fibrillation.  Does not smoke, does not drink alcohol, does not use any recreational substances.  Reports his diabetes is well controlled and so is her blood pressure. Initial work up revealed EEG with left > right cortical dysfunction in the temporal region. Overnight EEG monitoring initiated for further evaluation.   Seizure vs less likely TIA CT head No acute abnormality. No hemorrhage MRI  no acute abnormality MRA  : no LVO Carotid Doppler  no LVO; RICA 1-39% stenosis 2D Echo LVEF 70-75%, left ventricular hyperdynamic function and mild left ventricular hypertrophy.  EEG 9/14: This study is suggestive of suggestive of cortical dysfunction arising from left> right temporal region. nonspecific etiology. Additionally there is mild diffuse encephalopathy, nonspecific etiology. No seizures or epileptiform discharges were seen throughout the recording. Overnight LTM EEG pending LDL 114 HgbA1c 5.6 VTE prophylaxis - heparin    Diet   Diet Heart Room service appropriate? Yes; Fluid consistency: Thin   aspirin 81 mg daily prior to admission, now on aspirin 81 mg daily. Continue at discharge. Therapy recommendations:  pending Disposition:  pending  Hypertension Home meds:  atenolol Stable Permissive hypertension (OK if < 220/120) but gradually  normalize in 5-7 days Long-term BP goal normotensive  Hyperlipidemia Home meds:  none  LDL 114, goal < 70 Add crestor 20mg  daily Continue statin at discharge  Diabetes type II Controlled (no diagnosis) Home meds:  none HgbA1c 5.6, goal < 7.0 CBGs Recent Labs    10/10/21 1653  GLUCAP 95      Other Stroke Risk Factors Advanced Age >/= 66  Obesity, Body mass index is  37.55 kg/m., BMI >/= 30 associated with increased stroke risk, recommend weight loss, diet and exercise as appropriate  Obstructive sleep apnea, not on CPAP at home Congestive heart failure   Hospital day # 0  -- 76, AGACNP-BC [facility] [phone]  ATTENDING ATTESTATION:   81 year old with episode of slurred speech that is now resolved.  Normal exam.  She tells me she has had another episode or 2 of transient difficulty getting her words out which was brief.  EEG shows cortical dysfunction L>R.  Overnight LTM started this morning due to ongoing intermittent spells and abnormal EEG.  Plan is to initiate AED medication only if LTM is abnormal.  I encouraged her if she has any more episodes to inform the nurse while she is on the EEG monitor so the episode could be timestamp.  Continue aspirin 81 mg   Dr. 96 evaluated pt independently, reviewed imaging, chart, labs. Discussed and formulated plan with the Resident/APP. Changes were made to the note where appropriate. Please see APP/resident note above for details.      Hrishikesh Hoeg,MD   To contact Stroke Continuity provider, please refer to Viviann Spare. After hours, contact General Neurology

## 2021-10-12 NOTE — Progress Notes (Signed)
LTM EEG hooked up and running - no initial skin breakdown - push button tested - neuro notified. Atrium monitoring.  

## 2021-10-12 NOTE — Progress Notes (Signed)
[facility]  PROGRESS NOTE  SANDEEP DELAGARZA QBH:[id number] DOB: 1940/11/29 DOA: 10/10/2021 PCP: Elinor Dodge, MD   Brief HPI:   81 year old female with a history of hyperlipidemia, hypertension with 2-week history of intermittent aphasia and confusion.  Patient said that 2 weeks ago she was reading Bible at home, She got to some words that she knew what they were but she was unable to say them.  This resolved in about 10 minutes. Over the last 2 weeks she had several episodes where she had difficulty with speaking.  No facial droop. CT head was negative for acute abnormality. Neurology was consulted and recommended admission for the patient.   Subjective   Patient seen and examined, LTM EEG started this morning due to cortical dysfunction left more than right on EEG.  Denies dysuria   Assessment/Plan:     TIA -MRI brain/MRA brain were unremarkable -Carotid ultrasound shows bilateral carotid arteries with no significant stenosis -Echocardiogram showed EF of 70 to 75%.  No regional wall motion abnormality.  Mild LVH. -LDL is 114; start rosuvastatin 20 mg daily -Continue aspirin -Neurology following  ?  Seizure -EEG showed cortical dysfunction L>R -Neurology started LTM EEG -No AEDs yet  Hypertension -Blood pressure is stable -Home medications atenolol, furosemide on hold for permissive hypertension  Hyperlipidemia -LDL 114 -Started on rosuvastatin as above   Medications      stroke: early stages of recovery book   Does not apply Once   aspirin EC  81 mg Oral Daily   heparin  5,000 Units Subcutaneous Q8H   rosuvastatin  20 mg Oral Daily     Data Reviewed:   CBG:  Recent Labs  Lab 10/10/21 1653  GLUCAP 95    SpO2: 100 %    Vitals:   10/11/21 2300 10/12/21 0359 10/12/21 0742 10/12/21 1302  BP: (!) 140/60 129/72  133/61  Pulse: 72 80 66 65  Resp:      Temp: 98 F (36.7 C) 98.5 F (36.9 C) 97.8 F (36.6 C) 97.9 F (36.6 C)   TempSrc: Oral Oral Oral Oral  SpO2: 97% 98% 100% 100%  Weight:      Height:          Data Reviewed:  Basic Metabolic Panel: Recent Labs  Lab 10/10/21 1655  NA 138  K 3.8  CL 99  CO2 29  GLUCOSE 96  BUN 19  CREATININE 1.05*  CALCIUM 9.2    CBC: Recent Labs  Lab 10/10/21 1655  WBC 9.2  NEUTROABS 5.9  HGB 14.2  HCT 43.3  MCV 89.1  PLT 314    LFT Recent Labs  Lab 10/10/21 1655  AST 15  ALT 12  ALKPHOS 77  BILITOT 0.5  PROT 7.9  ALBUMIN 4.0     Antibiotics: Anti-infectives (From admission, onward)    Start     Dose/Rate Route Frequency Ordered Stop   10/10/21 1745  amoxicillin-clavulanate (AUGMENTIN) 875-125 MG per tablet 1 tablet        1 tablet Oral  Once 10/10/21 1740 10/10/21 1746        DVT prophylaxis: Heparin  Code Status: Full code  Family Communication: No family at bedside   CONSULTS neurology   Objective    Physical Examination:  General-appears in no acute distress Heart-S1-S2, regular, no murmur auscultated Lungs-clear to auscultation bilaterally, no wheezing or crackles auscultated Abdomen-soft, nontender, no organomegaly Extremities-no edema in the lower extremities Neuro-alert, oriented x3, no focal deficit noted   Status  is: Inpatient:           Meredeth Ide   [facility] If 7PM-7AM, please contact night-coverage at [email or web address], Office  [phone]   10/12/2021, 2:11 PM  LOS: 0 days

## 2021-10-12 NOTE — Progress Notes (Signed)
Due to limited LTM machines, EEG placement for this pt can be held off, per Derry Lory, MD.

## 2021-10-12 NOTE — Progress Notes (Signed)
Pt c/o "vague pain" in chest area and "just don't feel right."  Denies SOB, sharp or pressure like pain.  BP 155/65; 100% on 2 l/Lott; p 91.  C/o slight headache.  Medicated.  MD notified and orders received.

## 2021-10-12 NOTE — Plan of Care (Signed)

## 2021-10-12 NOTE — Care Management Obs Status (Signed)
MEDICARE OBSERVATION STATUS NOTIFICATION   Patient Details  Name: Marissa Dodson MRN: [id number] Date of Birth: [birth year]   Medicare Observation Status Notification Given:  Yes    Kermit Balo, RN 10/12/2021, 3:35 PM

## 2021-10-12 NOTE — Evaluation (Signed)
Physical Therapy Evaluation Patient Details Name: Marissa Dodson MRN: [id number] DOB: Oct 11, 1940 Today's Date: 10/12/2021  History of Present Illness  Pt is an 81 y/o female who presents 10/10/2021 with 2 week history of intermittent aphasia and confusion. MRI negative for acute changes. Admitted with TIA, workup pending. PMH significant for L knee meniscal injury (no date noted), angioedema, diverticulitis, HTN, interstitial cystitis, decreased memory, B knee arthroscopy, R THA 2022.  Clinical Impression  Patient presents with generalized weakness, cognitive deficits and impaired mobility s/p above. Pt lives at home with her daughter and reports being independent for ADLs and using [facility] for ambulation PTA. Today, pt requires Min guard assist for transfers and ambulation using [facility] for support. Noted to have difficulty dual tasking and performing cognitive tasks with walking. Also noted to have word finding difficulties, impaired attention, awareness and easily distracted. Pt reports feeling she is functioning at 50% of baseline. Would benefit from neuro OPPT to address above deficits. Will follow acutely to maximize independence and mobility prior to return home.      Recommendations for follow up therapy are one component of a multi-disciplinary discharge planning process, led by the attending physician.  Recommendations may be updated based on patient status, additional functional criteria and insurance authorization.  Follow Up Recommendations Outpatient PT (neuro)      Assistance Recommended at Discharge PRN  Patient can return home with the following  A little help with walking and/or transfers;A little help with bathing/dressing/bathroom;Direct supervision/assist for financial management;Direct supervision/assist for medications management;Assistance with cooking/housework;Help with stairs or ramp for entrance    Equipment Recommendations None recommended by PT  Recommendations for Other  Services       Functional Status Assessment Patient has had a recent decline in their functional status and demonstrates the ability to make significant improvements in function in a reasonable and predictable amount of time.     Precautions / Restrictions Precautions Precautions: Fall Restrictions Weight Bearing Restrictions: No      Mobility  Bed Mobility Overal bed mobility: Needs Assistance Bed Mobility: Supine to Sit, Sit to Supine     Supine to sit: Modified independent (Device/Increase time), HOB elevated Sit to supine: Modified independent (Device/Increase time), HOB elevated   General bed mobility comments: No assist needed.    Transfers Overall transfer level: Needs assistance Equipment used: Straight cane Transfers: Sit to/from Stand Sit to Stand: Min guard           General transfer comment: Min guard for safety. Stood from [employer].    Ambulation/Gait Ambulation/Gait assistance: Min guard Gait Distance (Feet): 500 Feet Assistive device: Straight cane Gait Pattern/deviations: Step-through pattern, Decreased stride length   Gait velocity interpretation: 1.31 - 2.62 ft/sec, indicative of limited community ambulator   General Gait Details: Slow, mostly steady gait using SPC for support, able to use in both hands. 2/4 DOE. Sp02 97% with activity.  Stairs Stairs: Yes Stairs assistance: Supervision Stair Management: Two rails, Alternating pattern Number of Stairs: 5 (x2 bouts) General stair comments: Cues for safety.  Wheelchair Mobility    Modified Rankin (Stroke Patients Only)       Balance Overall balance assessment: Mild deficits observed, not formally tested                                           Pertinent Vitals/Pain Pain Assessment Pain Assessment: No/denies pain  Home Living Family/patient expects to be discharged to:: Private residence Living Arrangements: Children Available Help at Discharge: Other  (Comment);Friend(s);Family;Available PRN/intermittently Type of Home: Apartment Home Access: Stairs to enter Entrance Stairs-Rails: Right;Left;Can reach both Entrance Stairs-Number of Steps: 15   Home Layout: One level Home Equipment: Cane - single Librarian, academic (2 wheels);Grab bars - tub/shower      Prior Function Prior Level of Function : Independent/Modified Independent;Driving             Mobility Comments: cane ADLs Comments: Moved in with daughter due to difficulty performing home management alone, but reports she still helps out     Hand Dominance   Dominant Hand: Right    Extremity/Trunk Assessment   Upper Extremity Assessment Upper Extremity Assessment: Defer to OT evaluation    Lower Extremity Assessment Lower Extremity Assessment: Generalized weakness (but functional)    Cervical / Trunk Assessment Cervical / Trunk Assessment: Normal  Communication   Communication: No difficulties  Cognition Arousal/Alertness: Awake/alert Behavior During Therapy: WFL for tasks assessed/performed Overall Cognitive Status: Impaired/Different from baseline Area of Impairment: Attention, Following commands, Safety/judgement, Awareness, Problem solving                   Current Attention Level: Selective   Following Commands: Follows one step commands consistently, Follows one step commands with increased time, Follows multi-step commands inconsistently Safety/Judgement: Decreased awareness of safety, Decreased awareness of deficits Awareness: Emergent Problem Solving: Slow processing General Comments: Difficulty with dual tasking during ambulation, also noted to have word finding difficulties. Able to wayfind back to her room. Easily distracted. Impaired STM. "I keep losing my phone." Seems to be getting more aware of deficits stating examples during session.        General Comments General comments (skin integrity, edema, etc.): HR up to 124 bpm max with  activity.    Exercises     Assessment/Plan    PT Assessment Patient needs continued PT services  PT Problem List Decreased mobility;Decreased strength;Decreased safety awareness;Decreased cognition;Decreased balance       PT Treatment Interventions Therapeutic activities;Gait training;Therapeutic exercise;Patient/family education;Balance training;Functional mobility training;Neuromuscular re-education;Stair training    PT Goals (Current goals can be found in the Care Plan section)  Acute Rehab PT Goals Patient Stated Goal: to get better PT Goal Formulation: With patient Time For Goal Achievement: 10/26/21 Potential to Achieve Goals: Good    Frequency Min 4X/week     Co-evaluation               AM-PAC PT "6 Clicks" Mobility  Outcome Measure Help needed turning from your back to your side while in a flat bed without using bedrails?: None Help needed moving from lying on your back to sitting on the side of a flat bed without using bedrails?: None Help needed moving to and from a bed to a chair (including a wheelchair)?: A Little Help needed standing up from a chair using your arms (e.g., wheelchair or bedside chair)?: A Little Help needed to walk in hospital room?: A Little Help needed climbing 3-5 steps with a railing? : A Little 6 Click Score: 20    End of Session Equipment Utilized During Treatment: Gait belt Activity Tolerance: Patient tolerated treatment well Patient left: in bed;with call bell/phone within reach (sitting EOB) Nurse Communication: Mobility status PT Visit Diagnosis: Difficulty in walking, not elsewhere classified (R26.2);Muscle weakness (generalized) (M62.81)    Time: 4854-6270 PT Time Calculation (min) (ACUTE ONLY): 20 min   Charges:   PT  Evaluation $PT Eval Moderate Complexity: 1 Mod          Vale Haven, PT, DPT Acute Rehabilitation Services Secure chat preferred Office [phone]     Blake Divine A Lanier Ensign 10/12/2021, 10:41  AM

## 2021-10-12 NOTE — TOC Initial Note (Signed)
Transition of Care [facility]) - Initial/Assessment Note    Patient Details  Name: Marissa Dodson MRN: [id number] Date of Birth: [birth year]  [facility] CM/SW Contact:    Kermit Balo, RN Phone Number: 10/12/2021, 11:17 AM  Clinical Narrative:                 Patient is from home with her daughter. Daughter works during the daytime.  Pt has a walker but uses a cane on a regular basis.  She was driving self as needed and managing her own medications.  Recommendations for outpatient rehab. Pt lives in [place] and would like to attend at [facility]. CM will fax info to the rehab: [phone]. TOC following for further d/c needs.   Expected Discharge Plan: OP Rehab Barriers to Discharge: Continued Medical Work up   Patient Goals and CMS Choice     Choice offered to / list presented to : Patient  Expected Discharge Plan and Services Expected Discharge Plan: OP Rehab   Discharge Planning Services: CM Consult   Living arrangements for the past 2 months: Single Family Home                                      Prior Living Arrangements/Services Living arrangements for the past 2 months: Single Family Home Lives with:: Adult Children Patient language and need for interpreter reviewed:: Yes Do you feel safe going back to the place where you live?: Yes          Current home services: DME (cane and walker) Criminal Activity/Legal Involvement Pertinent to Current Situation/Hospitalization: No - Comment as needed  Activities of Daily Living Home Assistive Devices/Equipment: Cane (specify quad or straight) ADL Screening (condition at time of admission) Patient's cognitive ability adequate to safely complete daily activities?: Yes Is the patient deaf or have difficulty hearing?: No Does the patient have difficulty seeing, even when wearing glasses/contacts?: No Does the patient have difficulty concentrating, remembering, or making decisions?:  No Patient able to express need for assistance with ADLs?: Yes Does the patient have difficulty dressing or bathing?: No Independently performs ADLs?: Yes (appropriate for developmental age) Does the patient have difficulty walking or climbing stairs?: Yes Weakness of Legs: Both Weakness of Arms/Hands: None  Permission Sought/Granted                  Emotional Assessment Appearance:: Appears stated age Attitude/Demeanor/Rapport: Engaged Affect (typically observed): Accepting Orientation: : Oriented to Self, Oriented to Place, Oriented to  Time, Oriented to Situation   Psych Involvement: No (comment)  Admission diagnosis:  Aphasia [R47.01] Difficulty speaking [R47.9] Patient Active Problem List   Diagnosis Date Noted   Essential hypertension 10/11/2021   TIA (transient ischemic attack) 10/10/2021   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   Chronic rhinitis 08/16/2019   Intolerance, food 08/16/2019   Hyperlipidemia 05/16/2019   Memory difficulties 07/18/2016   PCP:  Elinor Dodge, MD Pharmacy:   [facility] [id number] - [place], [place] - [address] AT [facility] [address] [place] [address] Phone: [phone] Fax: [phone]  [facility] [address] [place] [address] Phone: [phone] Fax: [phone]     Social Determinants of Health (SDOH) Interventions    Readmission Risk Interventions     No data to display

## 2021-10-13 DIAGNOSIS — R569 Unspecified convulsions: Secondary | ICD-10-CM

## 2021-10-13 DIAGNOSIS — I10 Essential (primary) hypertension: Secondary | ICD-10-CM | POA: Diagnosis not present

## 2021-10-13 DIAGNOSIS — E785 Hyperlipidemia, unspecified: Secondary | ICD-10-CM | POA: Diagnosis not present

## 2021-10-13 DIAGNOSIS — G459 Transient cerebral ischemic attack, unspecified: Secondary | ICD-10-CM | POA: Diagnosis not present

## 2021-10-13 MED ORDER — ATENOLOL 50 MG PO TABS: 50.0000 mg | ORAL_TABLET | Freq: Every day | ORAL | 3 refills | Status: DC

## 2021-10-13 MED ORDER — LEVETIRACETAM 500 MG PO TABS: 500.0000 mg | ORAL_TABLET | Freq: Two times a day (BID) | ORAL | Status: DC

## 2021-10-13 MED ORDER — LEVETIRACETAM 500 MG PO TABS: 500.0000 mg | ORAL_TABLET | Freq: Two times a day (BID) | ORAL | 2 refills | Status: DC

## 2021-10-13 MED ORDER — ROSUVASTATIN CALCIUM 20 MG PO TABS: 20.0000 mg | ORAL_TABLET | Freq: Every day | ORAL | 3 refills | Status: DC

## 2021-10-13 MED ORDER — ATENOLOL 25 MG PO TABS: 100.0000 mg | ORAL_TABLET | Freq: Every day | ORAL | Status: DC

## 2021-10-13 MED ORDER — AMITRIPTYLINE HCL 25 MG PO TABS
50.0000 mg | ORAL_TABLET | Freq: Every day | ORAL | Status: DC
  Administered 2021-10-13: 50 mg via ORAL
  Filled 2021-10-13: qty 2

## 2021-10-13 MED ORDER — ASPIRIN 81 MG PO TBEC: 81.0000 mg | DELAYED_RELEASE_TABLET | Freq: Every day | ORAL | 3 refills | Status: AC

## 2021-10-13 NOTE — Discharge Summary (Signed)
Physician Discharge Summary   Patient: Marissa Dodson MRN: [id number] DOB: 1941/01/04  Admit date:     10/10/2021  Discharge date: 10/13/21  Discharge Physician: Oswald Hillock   PCP: Jinny Blossom, [facility]   Recommendations at discharge:   Follow-up PCP in 2 weeks Follow-up with neurology as outpatient in 2 weeks  Discharge Diagnoses: Principal Problem:   TIA (transient ischemic attack) Active Problems:   Hyperlipidemia   Essential hypertension   Seizure ([place])  Resolved Problems:   * No resolved hospital problems. *  Hospital Course:  81 year old female with a history of hyperlipidemia, hypertension with 2-week history of intermittent aphasia and confusion.  Patient said that 2 weeks ago she was reading Bible at home, She got to some words that she knew what they were but she was unable to say them.  This resolved in about 10 minutes. Over the last 2 weeks she had several episodes where she had difficulty with speaking.  No facial droop. CT head was negative for acute abnormality. Neurology was consulted and recommended admission for the patient. Assessment and Plan:  TIA -MRI brain/MRA brain were unremarkable -Carotid ultrasound shows bilateral carotid arteries with no significant stenosis -Echocardiogram showed EF of 70 to 75%.  No regional wall motion abnormality.  Mild LVH. -LDL is 114; start rosuvastatin 20 mg daily -Continue aspirin -Neurology has signed off, patient to discharge home -Follow-up neurology as outpatient  ?  Seizure -EEG showed cortical dysfunction L>R -Neurology started LTM EEG -Neurology recommended discharge on Keppra 500 mg p.o. twice daily   ?  Chest pain/discomfort -Patient is that she becomes anxious and develops discomfort with increased heart rate -EKG obtained yesterday was unremarkable, troponin x2 were negative -Patient has an appointment to see Dr. Tamala Julian as outpatient -She had negative stress test as  outpatient -Follow-up cardiology as outpatient   Hypertension -Blood pressure is stable -Home medications atenolol, furosemide on hold for permissive hypertension -Since patient developed palpitations and chest discomfort, will restart atenolol -Since patient was bradycardic on admission, will cut down the dose of atenolol to 50 mg daily  Bradycardia -Patient had bradycardia with heart rate in 50s -We will cut down the dose of atenolol to 50 mg daily   Hyperlipidemia -LDL 114 -Started on rosuvastatin as above         Consultants: Neurology Procedures performed: Echocardiogram, LTM EEG Disposition: Home Diet recommendation:  Discharge Diet Orders (From admission, onward)     Start     Ordered   10/13/21 0000  Diet - low sodium heart healthy        10/13/21 1622           Cardiac diet DISCHARGE MEDICATION: Allergies as of 10/13/2021       Reactions   Ketek [telithromycin] Shortness Of Breath   Toprol Xl [metoprolol] Other (See Comments)   Caused hands to hurt   Cephalosporins Swelling, Other (See Comments)   Urinary retention   Codeine Itching   Durezol [difluprednate] Other (See Comments)   Eye redness   Flagyl [metronidazole] Swelling   Levaquin [levofloxacin] Swelling, Other (See Comments)   Headache   Maxzide [hydrochlorothiazide W-triamterene] Swelling   Low urine output   Mobic [meloxicam] Swelling, Other (See Comments)   Low urine output   Motrin [ibuprofen] Other (See Comments)   Told to avoid due to kidney function   Nsaids Other (See Comments)   Told to avoid due to kidney function   Prednisone Swelling   Low urine  output    Vistaril [hydroxyzine] Other (See Comments)   Short term memory loss        Medication List     STOP taking these medications    levocetirizine 5 MG tablet Commonly known as: XYZAL       TAKE these medications    acetaminophen 500 MG tablet Commonly known as: TYLENOL Take 500 mg by mouth daily as needed  for headache.   albuterol 108 (90 Base) MCG/ACT inhaler Commonly known as: VENTOLIN HFA Inhale 2 puffs into the lungs every 6 (six) hours as needed for wheezing or shortness of breath.   amitriptyline 50 MG tablet Commonly known as: ELAVIL Take 50 mg by mouth at bedtime.   aspirin EC 81 MG tablet Take 1 tablet (81 mg total) by mouth daily.   atenolol 50 MG tablet Commonly known as: Tenormin Take 1 tablet (50 mg total) by mouth daily. What changed:  medication strength how much to take   cetirizine 10 MG tablet Commonly known as: ZYRTEC Take 10 mg by mouth daily.   furosemide 40 MG tablet Commonly known as: LASIX Take 20 mg by mouth See admin instructions. 1/2 tablet (20 mg) every morning, takes the other 1/2 tablet (20 mg) later in the day if needed for swelling   levETIRAcetam 500 MG tablet Commonly known as: KEPPRA Take 1 tablet (500 mg total) by mouth 2 (two) times daily.   LUBRICATING EYE DROPS OP Place 1 drop into both eyes daily as needed (dry eyes).   omeprazole 20 MG capsule Commonly known as: PRILOSEC Take 20 mg by mouth daily.   polyethylene glycol powder 17 GM/SCOOP powder Commonly known as: GLYCOLAX/MIRALAX Take 17 g by mouth daily as needed for mild constipation.   potassium chloride SA 20 MEQ tablet Commonly known as: KLOR-CON M Take 20 mEq by mouth daily.   rosuvastatin 20 MG tablet Commonly known as: CRESTOR Take 1 tablet (20 mg total) by mouth daily. Start taking on: October 14, 2021        [facility]. Follow up.   Why: The outpatient therapy will contact you for the first appointment Contact information: [address] [place] [address] [phone]         [facility]. Schedule an appointment as soon as possible for a visit in 2 week(s).   Specialty: Neurology Contact information: [address] [place] [phone]        Equiptment, [facility] Follow up in 2 week(s).   Contact information: [address] [place] [address] [phone]         Belva Crome, MD .   Specialty: Cardiology Contact information: [phone] [place] [address] [phone]                Discharge Exam: Danley Danker Weights   10/10/21 1543  Weight: 96.2 kg   General-appears in no acute distress Heart-S1-S2, regular, no murmur auscultated Lungs-clear to auscultation bilaterally, no wheezing or crackles auscultated Abdomen-soft, nontender, no organomegaly Extremities-no edema in the lower extremities Neuro-alert, oriented x3, no focal deficit noted  Condition at discharge: good  The results of significant diagnostics from this hospitalization (including imaging, microbiology, ancillary and laboratory) are listed below for reference.   Imaging Studies: Overnight EEG with video  Result Date: 10/13/2021 Lora Havens, MD  10/13/2021  8:16 AM Patient Name: Marissa Dodson MRN: [id number] Epilepsy Attending: Lora Havens Referring Physician/Provider: Dorene Grebe, MD Duration: 10/12/2021 1000 to 10/13/2021 0810  Patient history: 81yo F with aphasia. EEG to evaluate for seizure  Level of alertness: Awake, asleep  AEDs during EEG study: None  Technical aspects: This EEG study was done with scalp electrodes positioned according to the 10-20 International system of electrode placement. Electrical activity was reviewed with band pass filter of 1-70Hz , sensitivity of 7 uV/mm, display speed of 65mm/sec with a 60Hz  notched filter applied as appropriate. EEG data were recorded continuously and digitally stored.  Video monitoring was available and reviewed as appropriate.  Description: The posterior dominant rhythm consists of 8-9 Hz activity of moderate voltage (25-35 uV) seen predominantly in posterior head regions, symmetric and reactive to eye  opening and eye closing. Sleep was characterized by vertex waves, sleep spindles (12 to 14 Hz), maximal frontocentral region. Hyperventilation and photic stimulation were not performed.    IMPRESSION: This study is within normal limits. No seizures or epileptiform discharges were seen throughout the recording.  Lora Havens    EEG adult  Result Date: 10/11/2021 Lora Havens, MD     10/11/2021  4:29 PM Patient Name: Marissa Dodson MRN: [id number] Epilepsy Attending: Lora Havens Referring Physician/Provider: Donnetta Simpers, MD Date: 10/11/2021 Duration: 22.40 mins Patient history: 81yo F with aphasia. EEG to evaluate for seizure Level of alertness: Awake, asleep AEDs during EEG study: None Technical aspects: This EEG study was done with scalp electrodes positioned according to the 10-20 International system of electrode placement. Electrical activity was reviewed with band pass filter of 1-70Hz , sensitivity of 7 uV/mm, display speed of 14mm/sec with a 60Hz  notched filter applied as appropriate. EEG data were recorded continuously and digitally stored.  Video monitoring was available and reviewed as appropriate. Description: The posterior dominant rhythm consists of 8-9 Hz activity of moderate voltage (25-35 uV) seen predominantly in posterior head regions, symmetric and reactive to eye opening and eye closing. Sleep was characterized by vertex waves, sleep spindles (12 to 14 Hz), maximal frontocentral region. EEG showed intermittent generalized and maximal left>right temporal region 3 to 6 Hz theta-delta slowing. Hyperventilation and photic stimulation were not performed.   ABNORMALITY - Intermittent slow, generalized and maximal left>right temporal region IMPRESSION: This study is suggestive of suggestive of cortical dysfunction arising from left> right temporal region. nonspecific etiology. Additionally there is mild diffuse encephalopathy, nonspecific etiology. No seizures or epileptiform  discharges were seen throughout the recording. Priyanka O Yadav   VAS US CAROTID (at [facility] and [facility] only)  Result Date: 10/11/2021 Carotid Arterial Duplex Study Patient Name:  Marissa Dodson  Date of Exam:   10/11/2021 Medical Rec #: [id number]          Accession #:    [id number] Date of Birth: 17-Dec-1940          Patient Gender: F Patient Age:   61 years Exam Location:  [facility] Procedure:      VAS US CAROTID Referring Phys: ERIC CHEN --------------------------------------------------------------------------------  Indications:       TIA. Risk Factors:      Hypertension. Comparison Study:  No prior study Performing Technologist: Maudry Mayhew MHA, RDMS, RVT, RDCS  Examination Guidelines: A complete evaluation includes B-mode imaging, spectral Doppler, color Doppler, and power Doppler as needed of all accessible portions of each vessel. Bilateral testing is considered an integral part of a complete examination. Limited  examinations for reoccurring indications may be performed as noted.  Right Carotid Findings: +----------+--------+--------+--------+-----------------------+--------+           PSV cm/sEDV cm/sStenosisPlaque Description     Comments +----------+--------+--------+--------+-----------------------+--------+ CCA Prox  107     19                                              +----------+--------+--------+--------+-----------------------+--------+ CCA Distal69      12              smooth and heterogenous         +----------+--------+--------+--------+-----------------------+--------+ ICA Prox  75      24              smooth and heterogenous         +----------+--------+--------+--------+-----------------------+--------+ ICA Distal79      25                                              +----------+--------+--------+--------+-----------------------+--------+ ECA       85                      heterogenous                     +----------+--------+--------+--------+-----------------------+--------+ +----------+--------+-------+----------------+-------------------+           PSV cm/sEDV cmsDescribe        Arm Pressure (mmHG) +----------+--------+-------+----------------+-------------------+ [id number]      50     Multiphasic, WNL                    +----------+--------+-------+----------------+-------------------+ +---------+--------+--+--------+--+---------+ VertebralPSV cm/s41EDV cm/s11Antegrade +---------+--------+--+--------+--+---------+  Left Carotid Findings: +----------+--------+--------+--------+------------------+------------------+           PSV cm/sEDV cm/sStenosisPlaque DescriptionComments           +----------+--------+--------+--------+------------------+------------------+ CCA Prox  110     25                                intimal thickening +----------+--------+--------+--------+------------------+------------------+ CCA Distal95      20                                                   +----------+--------+--------+--------+------------------+------------------+ ICA Prox  54      17                                                   +----------+--------+--------+--------+------------------+------------------+ ICA Distal65      18                                tortuous           +----------+--------+--------+--------+------------------+------------------+ ECA       106     9                                                    +----------+--------+--------+--------+------------------+------------------+ +----------+--------+--------+----------------+-------------------+  PSV cm/sEDV cm/sDescribe        Arm Pressure (mmHG) +----------+--------+--------+----------------+-------------------+ Subclavian224             Multiphasic, WNL                    +----------+--------+--------+----------------+-------------------+  +---------+--------+--+--------+-+---------+ VertebralPSV cm/s39EDV cm/s9Antegrade +---------+--------+--+--------+-+---------+   Summary: Right Carotid: Velocities in the right ICA are consistent with a 1-39% stenosis. Left Carotid: The extracranial vessels were near-normal with only minimal wall               thickening or plaque. Vertebrals:  Bilateral vertebral arteries demonstrate antegrade flow. Subclavians: Normal flow hemodynamics were seen in bilateral subclavian              arteries. *See table(s) above for measurements and observations.  Electronically signed by Jamelle Haring on 10/11/2021 at 4:11:51 PM.    Final    ECHOCARDIOGRAM COMPLETE  Result Date: 10/11/2021    ECHOCARDIOGRAM REPORT   Patient Name:   Marissa Dodson Date of Exam: 10/11/2021 Medical Rec #:  [id number]         Height:       63.0 in Accession #:    [id number]        Weight:       212.0 lb Date of Birth:  06/15/1940         BSA:          1.982 m Patient Age:    74 years          BP:           139/53 mmHg Patient Gender: F                 HR:           61 bpm. Exam Location:  Inpatient Procedure: 2D Echo, Cardiac Doppler, Color Doppler and Intracardiac            Opacification Agent Indications:    TIA G45.9  History:        Patient has prior history of Echocardiogram examinations, most                 recent 08/31/2019. Signs/Symptoms:Dyspnea; Risk Factors:Sleep                 Apnea, Hypertension and GERD.  Sonographer:    Greer Pickerel Referring Phys: [phone] ERIC CHEN  Sonographer Comments: Technically difficult study due to poor echo windows. IMPRESSIONS  1. Left ventricular ejection fraction, by estimation, is 70 to 75%. Left ventricular ejection fraction by 2D MOD biplane is 73.2 %. The left ventricle has hyperdynamic function. The left ventricle has no regional wall motion abnormalities. There is mild  left ventricular hypertrophy. Left ventricular diastolic parameters are consistent with Grade I diastolic dysfunction  (impaired relaxation).  2. Right ventricular systolic function is normal. The right ventricular size is normal. There is normal pulmonary artery systolic pressure. The estimated right ventricular systolic pressure is 93.2 mmHg.  3. The mitral valve was not well visualized. No evidence of mitral valve regurgitation.  4. The aortic valve is tricuspid. Aortic valve regurgitation is not visualized.  5. The inferior vena cava is normal in size with greater than 50% respiratory variability, suggesting right atrial pressure of 3 mmHg. Comparison(s): Changes from prior study are noted. 08/31/2019: LVEF 60-65%. FINDINGS  Left Ventricle: Left ventricular ejection fraction, by estimation, is 70 to 75%. Left ventricular ejection fraction by 2D MOD biplane is 73.2 %. The left  ventricle has hyperdynamic function. The left ventricle has no regional wall motion abnormalities. Definity contrast agent was given IV to delineate the left ventricular endocardial borders. The left ventricular internal cavity size was normal in size. There is mild left ventricular hypertrophy. Left ventricular diastolic parameters are consistent with Grade I diastolic dysfunction (impaired relaxation). Indeterminate filling pressures. Right Ventricle: The right ventricular size is normal. No increase in right ventricular wall thickness. Right ventricular systolic function is normal. There is normal pulmonary artery systolic pressure. The tricuspid regurgitant velocity is 1.92 m/s, and  with an assumed right atrial pressure of 3 mmHg, the estimated right ventricular systolic pressure is 123456 mmHg. Left Atrium: Left atrial size was normal in size. Right Atrium: Right atrial size was normal in size. Pericardium: There is no evidence of pericardial effusion. Mitral Valve: The mitral valve was not well visualized. No evidence of mitral valve regurgitation. Tricuspid Valve: The tricuspid valve is grossly normal. Tricuspid valve regurgitation is trivial. Aortic  Valve: The aortic valve is tricuspid. Aortic valve regurgitation is not visualized. Pulmonic Valve: The pulmonic valve was normal in structure. Pulmonic valve regurgitation is not visualized. Aorta: The aortic root and ascending aorta are structurally normal, with no evidence of dilitation. Venous: The inferior vena cava is normal in size with greater than 50% respiratory variability, suggesting right atrial pressure of 3 mmHg. IAS/Shunts: The interatrial septum was not well visualized.  LEFT VENTRICLE PLAX 2D                        Biplane EF (MOD) LVIDd:         3.90 cm         LV Biplane EF:   Left LVIDs:         2.80 cm                          ventricular LV PW:         1.10 cm                          ejection LV IVS:        0.70 cm                          fraction by LVOT diam:     2.00 cm                          2D MOD LV SV:         71                               biplane is LV SV Index:   36                               73.2 %. LVOT Area:     3.14 cm                                Diastology                                LV e' medial:  6.83 cm/s LV Volumes (MOD)               LV E/e' medial:  10.1 LV vol d, MOD    51.8 ml       LV e' lateral:   8.67 cm/s A2C:                           LV E/e' lateral: 7.9 LV vol d, MOD    51.5 ml A4C: LV vol s, MOD    11.1 ml A2C: LV vol s, MOD    16.2 ml A4C: LV SV MOD A2C:   40.7 ml LV SV MOD A4C:   51.5 ml LV SV MOD BP:    37.8 ml RIGHT VENTRICLE RV Basal diam:  4.00 cm RV Mid diam:    4.30 cm RV S prime:     14.80 cm/s TAPSE (M-mode): 1.9 cm LEFT ATRIUM             Index        RIGHT ATRIUM           Index LA diam:        4.20 cm 2.12 cm/m   RA Area:     18.60 cm LA Vol (A2C):   55.5 ml 28.00 ml/m  RA Volume:   52.80 ml  26.64 ml/m LA Vol (A4C):   33.1 ml 16.70 ml/m LA Biplane Vol: 43.4 ml 21.90 ml/m  AORTIC VALVE LVOT Vmax:   96.10 cm/s LVOT Vmean:  67.800 cm/s LVOT VTI:    0.226 m  AORTA Ao Root diam: 3.30 cm Ao Asc diam:  3.50 cm MITRAL VALVE                TRICUSPID VALVE MV Area (PHT): 2.90 cm    TR Peak grad:   14.7 mmHg MV Decel Time: 262 msec    TR Vmax:        192.00 cm/s MV E velocity: 68.90 cm/s MV A velocity: 81.50 cm/s  SHUNTS MV E/A ratio:  0.85        Systemic VTI:  0.23 m                            Systemic Diam: 2.00 cm Lyman Bishop MD Electronically signed by Lyman Bishop MD Signature Date/Time: 10/11/2021/1:33:31 PM    Final    MR ANGIO HEAD WO CONTRAST  Result Date: 10/11/2021 CLINICAL DATA:  81 year old female with intermittent abnormal speech and word-finding difficulty since late July. EXAM: MRA HEAD WITHOUT CONTRAST TECHNIQUE: Angiographic images of the Circle of Willis were acquired using MRA technique without intravenous contrast. COMPARISON:  Brain MRI today. FINDINGS: Motion degraded exam. Anterior circulation: Motion artifact degrades detail of both ICA siphons which appear to remain patent with antegrade flow bilaterally. Patent carotid termini, MCA and ACA origins. MCA M1 segments and bifurcations are patent although some vessel detail is degraded. When allowing for motion artifact the visible MCA and ACA branches are within normal limits. Posterior circulation: Antegrade flow in the posterior circulation. Distal vertebral arteries and basilar artery to the distal basilar appear patent without stenosis. Both PICA origins are patent. Motion artifact felt responsible for irregularity of the distal basilar artery proximal to the tip (series 1030, image 9). SCA and PCA origins remain patent. Right posterior communicating artery is visible, the left is diminutive or absent. Bilateral PCA branches are within  normal limits. Anatomic variants: None. Other: Brain MRI today reported separately. IMPRESSION: Negative for age intracranial MRA when allowing for motion artifact. Electronically Signed   By: Genevie Ann M.D.   On: 10/11/2021 05:30   MR BRAIN WO CONTRAST  Result Date: 10/11/2021 CLINICAL DATA:  81 year old female with intermittent  abnormal speech and word-finding difficulty since late July. EXAM: MRI HEAD WITHOUT CONTRAST TECHNIQUE: Multiplanar, multiecho pulse sequences of the brain and surrounding structures were obtained without intravenous contrast. COMPARISON:  Head CT 10/10/2021.  Brain MRI 08/24/2016. FINDINGS: Brain: Cerebral volume has not significantly changed since 2018 and appears normal for age. No restricted diffusion to suggest acute infarction. No midline shift, mass effect, evidence of mass lesion, ventriculomegaly, extra-axial collection or acute intracranial hemorrhage. Cervicomedullary junction and pituitary are within normal limits. Pear[place] Cables and white matter signal appears stable since 2018 and within normal limits for age. No cortical encephalomalacia or chronic cerebral blood products identified. Deep gray matter nuclei, brainstem, and cerebellum appear negative. Vascular: Major intracranial vascular flow voids are stable. Skull and upper cervical spine: Negative. Visualized bone marrow signal is within normal limits. Negative for age visible cervical spine. Sinuses/Orbits: Postoperative changes to both globes since 2018. Paranasal sinuses and mastoids are stable and well aerated. Other: Visible scalp and face appear negative. IMPRESSION: No acute intracranial abnormality. Normal for age noncontrast MRI appearance of the brain, stable since 2018. Electronically Signed   By: Genevie Ann M.D.   On: 10/11/2021 05:13   CT HEAD WO CONTRAST  Result Date: 10/10/2021 CLINICAL DATA:  Altered mental status EXAM: CT HEAD WITHOUT CONTRAST TECHNIQUE: Contiguous axial images were obtained from the base of the skull through the vertex without intravenous contrast. RADIATION DOSE REDUCTION: This exam was performed according to the departmental dose-optimization program which includes automated exposure control, adjustment of the mA and/or kV according to patient size and/or use of iterative reconstruction technique. COMPARISON:  MR brain  done on 08/24/2016 FINDINGS: Brain: No acute intracranial findings are seen. There are no signs of bleeding within the cranium. Cortical sulci are prominent. Ventricles are not dilated. Vascular: Unremarkable. Skull: Unremarkable. Sinuses/Orbits: Unremarkable. Other: None. IMPRESSION: No acute intracranial findings are seen in noncontrast CT brain. Atrophy. Electronically Signed   By: Elmer Picker M.D.   On: 10/10/2021 17:33    Microbiology: Results for orders placed or performed in visit on 10/13/20  SARS Coronavirus 2 (TAT 6-24 hrs)     Status: None   Collection Time: 10/13/20 12:00 AM  Result Value Ref Range Status   SARS Coronavirus 2 RESULT: NEGATIVE  Final    Comment: RESULT: NEGATIVESARS-CoV-2 INTERPRETATION:A NEGATIVE  test result means that SARS-CoV-2 RNA was not present in the specimen above the limit of detection of this test. This does not preclude a possible SARS-CoV-2 infection and should not be used as the  sole basis for patient management decisions. Negative results must be combined with clinical observations, patient history, and epidemiological information. Optimum specimen types and timing for peak viral levels during infections caused by SARS-CoV-2  have not been determined. Collection of multiple specimens or types of specimens may be necessary to detect virus. Improper specimen collection and handling, sequence variability under primers/probes, or organism present below the limit of detection may  lead to false negative results. Positive and negative predictive values of testing are highly dependent on prevalence. False negative test results are more likely when prevalence of disease is high.The expected result is NEGATIVE.Fact S heet for  Healthcare  Providers: LocalChronicle.no Sheet for Patients: SalonLookup.es Reference Range - Negative     Labs: CBC: Recent Labs  Lab 10/10/21 1655 10/12/21 1638  WBC  9.2 8.0  NEUTROABS 5.9  --   HGB 14.2 14.1  HCT 43.3 42.5  MCV 89.1 88.5  PLT 314 [id number]   Basic Metabolic Panel: Recent Labs  Lab 10/10/21 1655  NA 138  K 3.8  CL 99  CO2 29  GLUCOSE 96  BUN 19  CREATININE 1.05*  CALCIUM 9.2   Liver Function Tests: Recent Labs  Lab 10/10/21 1655  AST 15  ALT 12  ALKPHOS 77  BILITOT 0.5  PROT 7.9  ALBUMIN 4.0   CBG: Recent Labs  Lab 10/10/21 1653 10/12/21 1647  GLUCAP 95 89    Discharge time spent: greater than 30 minutes.  Signed: Oswald Hillock, MD [facility] 10/13/2021

## 2021-10-13 NOTE — Progress Notes (Signed)
[facility]  PROGRESS NOTE  Marissa Dodson HAL:[id number] DOB: 09/27/1940 DOA: 10/10/2021 PCP: Jinny Blossom, [facility]   Brief HPI:   81 year old female with a history of hyperlipidemia, hypertension with 2-week history of intermittent aphasia and confusion.  Patient said that 2 weeks ago she was reading Bible at home, She got to some words that she knew what they were but she was unable to say them.  This resolved in about 10 minutes. Over the last 2 weeks she had several episodes where she had difficulty with speaking.  No facial droop. CT head was negative for acute abnormality. Neurology was consulted and recommended admission for the patient.   Subjective   Patient complained of chest discomfort yesterday, resolved.  Troponin x2 were negative.  EKG was unremarkable.    Assessment/Plan:     TIA -MRI brain/MRA brain were unremarkable -Carotid ultrasound shows bilateral carotid arteries with no significant stenosis -Echocardiogram showed EF of 70 to 75%.  No regional wall motion abnormality.  Mild LVH. -LDL is 114; start rosuvastatin 20 mg daily -Continue aspirin -Neurology following  ?  Seizure -EEG showed cortical dysfunction L>R -Neurology started LTM EEG -No AEDs yet  ?  Chest pain/discomfort -Patient is that she becomes anxious and develops discomfort with increased heart rate -EKG obtained yesterday was unremarkable, troponin x2 were negative -Patient has an appointment to see Dr. Tamala Julian as outpatient -She had negative stress test as outpatient -Follow-up cardiology as outpatient  Hypertension -Blood pressure is stable -Home medications atenolol, furosemide on hold for permissive hypertension -Since patient developed palpitations and chest discomfort, will restart atenolol  Hyperlipidemia -LDL 114 -Started on rosuvastatin as above   Medications     amitriptyline  50 mg Oral QHS   aspirin EC  81 mg Oral Daily   heparin  5,000 Units  Subcutaneous Q8H   rosuvastatin  20 mg Oral Daily     Data Reviewed:   CBG:  Recent Labs  Lab 10/10/21 1653 10/12/21 1647  GLUCAP 95 89    SpO2: 97 %    Vitals:   10/12/21 2357 10/13/21 0401 10/13/21 0848 10/13/21 1216  BP: (!) 150/95 (!) 125/51 (!) 144/51 (!) 146/62  Pulse: 90 77 92 86  Resp: (!) 25 17 20 16   Temp: 98.1 F (36.7 C) 97.7 F (36.5 C) 98.1 F (36.7 C) 98.1 F (36.7 C)  TempSrc: Oral Oral Oral Oral  SpO2: 99% 100% 95% 97%  Weight:      Height:          Data Reviewed:  Basic Metabolic Panel: Recent Labs  Lab 10/10/21 1655  NA 138  K 3.8  CL 99  CO2 29  GLUCOSE 96  BUN 19  CREATININE 1.05*  CALCIUM 9.2    CBC: Recent Labs  Lab 10/10/21 1655 10/12/21 1638  WBC 9.2 8.0  NEUTROABS 5.9  --   HGB 14.2 14.1  HCT 43.3 42.5  MCV 89.1 88.5  PLT 314 312    LFT Recent Labs  Lab 10/10/21 1655  AST 15  ALT 12  ALKPHOS 77  BILITOT 0.5  PROT 7.9  ALBUMIN 4.0     Antibiotics: Anti-infectives (From admission, onward)    Start     Dose/Rate Route Frequency Ordered Stop   10/10/21 1745  amoxicillin-clavulanate (AUGMENTIN) 875-125 MG per tablet 1 tablet        1 tablet Oral  Once 10/10/21 1740 10/10/21 1746        DVT prophylaxis: Heparin  Code Status: Full code  Family Communication: No family at bedside   CONSULTS neurology   Objective    Physical Examination:  General-appears in no acute distress Heart-S1-S2, regular, no murmur auscultated Lungs-clear to auscultation bilaterally, no wheezing or crackles auscultated Abdomen-soft, nontender, no organomegaly Extremities-no edema in the lower extremities Neuro-alert, oriented x3, no focal deficit noted  Status is: Inpatient:           Meredeth Ide   [facility] If 7PM-7AM, please contact night-coverage at [email or web address], Office  [phone]   10/13/2021, 3:43 PM  LOS: 1 day

## 2021-10-13 NOTE — Progress Notes (Signed)
EEG maint complete.  ?

## 2021-10-13 NOTE — Procedures (Signed)
Patient Name: Marissa Dodson  MRN: [id number]  Epilepsy Attending: Lora Havens  Referring Physician/Provider: Dorene Grebe, MD  Duration: 10/12/2021 1000 to 10/13/2021 1000   Patient history: 81yo F with aphasia. EEG to evaluate for seizure   Level of alertness: Awake, asleep   AEDs during EEG study: None   Technical aspects: This EEG study was done with scalp electrodes positioned according to the 10-20 International system of electrode placement. Electrical activity was reviewed with band pass filter of 1-70Hz , sensitivity of 7 uV/mm, display speed of 20mm/sec with a 60Hz  notched filter applied as appropriate. EEG data were recorded continuously and digitally stored.  Video monitoring was available and reviewed as appropriate.   Description: The posterior dominant rhythm consists of 8-9 Hz activity of moderate voltage (25-35 uV) seen predominantly in posterior head regions, symmetric and reactive to eye opening and eye closing. Sleep was characterized by vertex waves, sleep spindles (12 to 14 Hz), maximal frontocentral region. Hyperventilation and photic stimulation were not performed.      IMPRESSION: This study is within normal limits. No seizures or epileptiform discharges were seen throughout the recording.   Marissa Dodson

## 2021-10-13 NOTE — Progress Notes (Signed)
LTM EEG discontinued - no skin breakdown at unhook.   

## 2021-10-13 NOTE — Progress Notes (Addendum)
STROKE TEAM PROGRESS NOTE   ATTENDING NOTE: I reviewed above note and agree with the assessment and plan. Pt was seen and examined.   81 year old female with history of hypertension, diabetes, OSA admitted for intermittent episodes of word finding difficulty, possible right facial droop, slurring words since July.  Denies any weakness numbness, shaking jerking or loss of consciousness.  Initial EEG showed left more than right temporal slowing but overnight EEG showed normal EEG.  CT/MRI/MRA/carotid Doppler all negative.  EF 70 to 75%, LDL 114, A1c 5.6.  UDS negative.  Creatinine 1.05.  On exam, patient currently neuro intact except delayed recall 2/3 and backward spelling world needed 2 attempts.  Etiology for patient symptoms not quite clear, but presentation not typical for TIA or seizure.  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  Will recommend continue aspirin 81 and add Keppra 500 twice daily and close outpatient neurology follow-up.  If episode decreased with Keppra, seizure can be considered.  Otherwise, may need out negative diagnosis including PPA.  Add Crestor 20 for HLD.  For detailed assessment and plan, please refer to above/below as I have made changes wherever appropriate.   Neurology will sign off. Please call with questions. Pt will follow up with MD at [facility] in about 2-3 weeks. Thanks for the consult.   Marvel Plan, MD PhD Stroke Neurology 10/13/2021 9:51 PM    INTERVAL HISTORY There is no family at bedside. Patient is resting comfortably in bed. Just ate lunch.  Fully alert.  Overnight EEG suggestive of left > right cortical dysfunction but no epileptic events.     Vitals:   10/13/21 0401 10/13/21 0848 10/13/21 1216 10/13/21 1610  BP: (!) 125/51 (!) 144/51 (!) 146/62 (!) 138/93  Pulse: 77 92 86 99  Resp: 17 20 16 18   Temp: 97.7 F (36.5 C) 98.1 F (36.7 C) 98.1 F (36.7 C) 98 F (36.7 C)  TempSrc: Oral Oral Oral Oral  SpO2: 100% 95% 97% 97%  Weight:       Height:       CBC:  Recent Labs  Lab 10/10/21 1655 10/12/21 1638  WBC 9.2 8.0  NEUTROABS 5.9  --   HGB 14.2 14.1  HCT 43.3 42.5  MCV 89.1 88.5  PLT 314 312   Basic Metabolic Panel:  Recent Labs  Lab 10/10/21 1655  NA 138  K 3.8  CL 99  CO2 29  GLUCOSE 96  BUN 19  CREATININE 1.05*  CALCIUM 9.2   Lipid Panel:  Recent Labs  Lab 10/11/21 0526  CHOL 191  TRIG 79  HDL 61  CHOLHDL 3.1  VLDL 16  LDLCALC 10/13/21*   HgbA1c:  Recent Labs  Lab 10/11/21 0526  HGBA1C 5.6   Urine Drug Screen:  Recent Labs  Lab 10/10/21 1536  LABOPIA NONE DETECTED  COCAINSCRNUR NONE DETECTED  LABBENZ NONE DETECTED  AMPHETMU NONE DETECTED  THCU NONE DETECTED  LABBARB NONE DETECTED    Alcohol Level  Recent Labs  Lab 10/10/21 1655  ETH <10   IMAGING past 24 hours Overnight EEG with video  Result Date: 10/13/2021 10/15/2021, MD     10/13/2021  8:16 AM Patient Name: Marissa Dodson MRN: Tiana Loft Epilepsy Attending: [id number] Referring Physician/Provider: Charlsie Quest, MD Duration: 10/12/2021 1000 to 10/13/2021 0810  Patient history: 81yo F with aphasia. EEG to evaluate for seizure  Level of alertness: Awake, asleep  AEDs during EEG study: None  Technical aspects: This EEG study was done  with scalp electrodes positioned according to the 10-20 International system of electrode placement. Electrical activity was reviewed with band pass filter of 1-70Hz , sensitivity of 7 uV/mm, display speed of 44mm/sec with a 60Hz  notched filter applied as appropriate. EEG data were recorded continuously and digitally stored.  Video monitoring was available and reviewed as appropriate.  Description: The posterior dominant rhythm consists of 8-9 Hz activity of moderate voltage (25-35 uV) seen predominantly in posterior head regions, symmetric and reactive to eye opening and eye closing. Sleep was characterized by vertex waves, sleep spindles (12 to 14 Hz), maximal frontocentral region.  Hyperventilation and photic stimulation were not performed.    IMPRESSION: This study is within normal limits. No seizures or epileptiform discharges were seen throughout the recording.  [place]     PHYSICAL EXAM Physical Exam  Constitutional: Appears well-developed and well-nourished.  Psych: Affect appropriate to situation, calm and cooperative with examination Cardiovascular: Normal rate and regular rhythm on cardiac monitor Respiratory: Effort normal, non-labored breathing saturations WNL Neuro:  Mental Status:  Alert, oriented, thought content appropriate.  Speech fluent. She does have continued subjective intermittent word-finding difficulties. Able to follow commands without difficulty. Cranial Nerves: II:  Visual fields grossly normal III,IV, VI: ptosis not present, EOMI bilaterally pupils equal, round, reactive to light V,VII: Smile asymmetric, facial light touch sensation normal bilaterally VIII: hearing normal bilaterally IX,X: Palate rises symmetrically XI: bilateral shoulder shrug XII: midline tongue extension Motor: Right : Upper extremity   5/5  Left:     Upper extremity   5/5  Lower extremity   5/5   Lower extremity   5/5 Tone and bulk:normal tone throughout; no atrophy noted Sensory:  light touch intact throughout, bilaterally Cerebellar: FNF intact, HKS intact with some limitation due to reported knee pain on the right > left Gait: deferred  ASSESSMENT/PLAN Marissa Dodson is a 81 y.o. female with PMH significant for HTN, obesity, DM2, GERD, OSA who presents with intermittent slurred speech and word finding difficulty.  10/09/2021 she had a 76min, self resolving, episode of word finding trouble.  She notes 1 prior episode in 07/2021 of similar word finding difficulties.  She also notes some subjective times of word finding difficulties during stressful periods, not as severe as the episode yesterday, which is what brought her into the ED.    No prior history  of strokes, sister had 2 strokes, mother had atrial fibrillation.  Does not smoke, does not drink alcohol, does not use any recreational substances.  Reports his diabetes is well controlled and so is her blood pressure.   No epileptic activity has been captured on EEG, but there was some abnormal activity which could point to possible seizure.  This is less likely TIA due to frequency of the episodes of word finding issues.    Episodic word finding difficulty, atypical for seizure or TIA.  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 No hemorrhage MRI  no acute abnormality MRA  no LVO Carotid Doppler  no LVO; RICA 1-39% stenosis 2D Echo LVEF 70-75%, left ventricular hyperdynamic function and mild left ventricular hypertrophy.  EEG 9/14: This study is suggestive of suggestive of cortical dysfunction arising from left> right temporal region. nonspecific etiology. Additionally there is mild diffuse encephalopathy, nonspecific etiology. No seizures or epileptiform discharges were seen throughout the recording.  LTM EEG 9/16: Normal EEG, no epileptic events overnight.  Will DC today. Start keppra 500mg  bid.  Can follow up outpatient for effectiveness. LDL  114 HgbA1c 5.6 VTE prophylaxis - heparin aspirin 81 mg daily prior to admission, now on aspirin 81 mg daily. Continue at discharge. Therapy recommendations: None Disposition: Home today  Hypertension Home meds:  atenolol Still slightly elevated, but much improved Long-term BP goal normotensive  Hyperlipidemia Home meds:  none  LDL 114, goal < 70 Rosuvastatin (Crestor) 20mg  daily added Continue statin at discharge  Other Stroke Risk Factors Advanced Age >/= 43  Obesity, Body mass index is 37.55 kg/m., BMI >/= 30 associated with increased stroke risk, recommend weight loss, diet and exercise as appropriate  OSA, not on CPAP at home  Hospital day # 1   Patient seen and examined by NP with MD. MD to update  note as needed.   76, AGNP Neurology Hospitalist  To contact Stroke Continuity provider, please refer to [place] Napoleon. After hours, contact General Neurology

## 2021-10-13 NOTE — Progress Notes (Signed)
Discharge instructions have been printed and reviewed with patient.  Patient verbalizes understanding and has no questions.  Peripheral IV and telemetry have been removed from patient

## 2021-10-14 NOTE — Procedures (Signed)
Patient Name: KIMALA HORNE  MRN: [id number]  Epilepsy Attending: Lora Havens  Referring Physician/Provider: Dorene Grebe, MD  Duration: 10/13/2021 1000 to 10/13/2021 1334   Patient history: 81yo F with aphasia. EEG to evaluate for seizure   Level of alertness: Awake, asleep   AEDs during EEG study: None   Technical aspects: This EEG study was done with scalp electrodes positioned according to the 10-20 International system of electrode placement. Electrical activity was reviewed with band pass filter of 1-70Hz , sensitivity of 7 uV/mm, display speed of 16mm/sec with a 60Hz  notched filter applied as appropriate. EEG data were recorded continuously and digitally stored.  Video monitoring was available and reviewed as appropriate.   Description: The posterior dominant rhythm consists of 8-9 Hz activity of moderate voltage (25-35 uV) seen predominantly in posterior head regions, symmetric and reactive to eye opening and eye closing. Sleep was characterized by vertex waves, sleep spindles (12 to 14 Hz), maximal frontocentral region. Hyperventilation and photic stimulation were not performed.      IMPRESSION: This study is within normal limits. No seizures or epileptiform discharges were seen throughout the recording.   Nechemia Chiappetta Barbra Sarks

## 2021-10-16 ENCOUNTER — Encounter: Payer: Self-pay | Admitting: Neurology

## 2021-10-16 ENCOUNTER — Ambulatory Visit: Payer: Medicare HMO | Admitting: Neurology

## 2021-10-16 VITALS — BP 142/83 | HR 73 | Ht 63.0 in | Wt 214.0 lb

## 2021-10-16 DIAGNOSIS — F418 Other specified anxiety disorders: Secondary | ICD-10-CM

## 2021-10-16 DIAGNOSIS — R4789 Other speech disturbances: Secondary | ICD-10-CM

## 2021-10-16 MED ORDER — LAMOTRIGINE 100 MG PO TABS: 100.0000 mg | ORAL_TABLET | Freq: Two times a day (BID) | ORAL | 11 refills | Status: DC

## 2021-10-16 MED ORDER — LAMOTRIGINE 25 MG PO TABS: ORAL_TABLET | ORAL | 0 refills | Status: DC

## 2021-10-16 NOTE — Progress Notes (Signed)
Chief Complaint  Patient presents with   New Patient (Initial Visit)    Rm 13. Alone. NX Willis and Marissa Dodson-l/s 2019/internal referral for difficulty speaking.      ASSESSMENT AND PLAN  Marissa Dodson is a 81 y.o. female   Confusion and word finding difficulty spells, In the setting of cognitive impairment, anxiety Untreated obstructive sleep apnea  MoCA examination 17/30 today  MRI of the brain was normal  Complete laboratory evaluation to rule out treatable cause for memory loss  Will stop Keppra  Lamotrigine 25 mg titrating to 100 mg, will treat her for 6 months,  I think her word finding difficulties are most likely due to anxiety, in the setting of cognitive impairment, less likely represent partial seizure,  I advised her to bring her children at next visit, if she has no recurrent spells, may taper off lamotrigine,  She would benefit better control of her anxiety, sleep apnea     DIAGNOSTIC DATA (LABS, IMAGING, TESTING) - I reviewed patient records, labs, notes, testing and imaging myself where available. Personally reviewed MRI of the brain October 11, 2021 that was normal  EEG by Dr. Hortense Ramal on October 11, 2021, intermittent slowing, generalized, maximum left more than right temporal region  MEDICAL HISTORY:  Marissa Dodson, is a 81 year old female patient of Dr. Jannifer Franklin in the past, follow-up hospital discharge for transient confusion and word finding difficulties,  I reviewed and summarized the referring note.PMHX. 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, reported increased stress over the past few months, she has to move out of her previous apartment end of July 2023, her apartment has no air conditioning, now lives with her daughter  All this has heightened her anxiety, on October 10, 2021, while she was eating, she felt the milk was coming out of the right mouth corner, she was trying to read  her Bible, the word could not come out of her mouth right, then she looked down at her leg, she noticed a big bruise at left lateral leg,  She was so panic thought she had a big bleeding, she called her daughter, then was sent to emergency room she denies difficulty walking at that time  Personally reviewed MRI of the brain without contrast October 11, 2021, no acute abnormality, and appropriate, stable since 2018  MRA of brain showed no large vessel disease  Echocardiogram ejection fraction 70% no significant abnormality  Overnight EEG monitoring was normal, initial EEG on October 11, 2021 reported intermittent generalized slowing, maximum left more than right temporal region, I personally reviewed the tracing, subtle differences if any  Laboratory evaluation showed normal and active vitamin D, ANA, CPK, ESR, B12, C-reactive protein, RPR, TSH,  She does complains of gradual onset memory loss, MoCA examination 17/30 today, also complains of difficulty sleeping, excessive daytime sleepiness, fatigue   PHYSICAL EXAM:   Vitals:   10/16/21 0915  BP: (!) 142/83  Pulse: 73  Weight: 214 lb (97.1 kg)  Height: 5' 3"  (1.6 m)   Not recorded     Body mass index is 37.91 kg/m.  PHYSICAL EXAMNIATION:  Gen: NAD, conversant, well nourised, well groomed                     Cardiovascular: Regular rate rhythm, no peripheral edema, warm, nontender. Eyes: Conjunctivae clear without exudates or hemorrhage Neck: Supple, no carotid bruits. Pulmonary: Clear to auscultation bilaterally  NEUROLOGICAL EXAM:  MENTAL STATUS: Speech/cognition: Anxious looking elderly female, oriented to history taking and casual conversation     10/16/2021   10:00 AM  Montreal Cognitive Assessment   Visuospatial/ Executive (0/5) 2  Naming (0/3) 2  Attention: Read list of digits (0/2) 2  Attention: Read list of letters (0/1) 1  Attention: Serial 7 subtraction starting at 100 (0/3) 0  Language: Repeat  phrase (0/2) 2  Language : Fluency (0/1) 1  Abstraction (0/2) 1  Delayed Recall (0/5) 0  Orientation (0/6) 6  Total 17    CRANIAL NERVES: CN II: Visual fields are full to confrontation. Pupils are round equal and briskly reactive to light. CN III, IV, VI: extraocular movement are normal. No ptosis. CN V: Facial sensation is intact to light touch CN VII: Face is symmetric with normal eye closure  CN VIII: Hearing is normal to causal conversation. CN IX, X: Phonation is normal. CN XI: Head turning and shoulder shrug are intact  MOTOR: There is no pronator drift of out-stretched arms. Muscle bulk and tone are normal. Muscle strength is normal.  REFLEXES: Reflexes are 2+ and symmetric at the biceps, triceps, knees, and ankles. Plantar responses are flexor.  SENSORY: Intact to light touch, pinprick and vibratory sensation are intact in fingers and toes.  COORDINATION: There is no trunk or limb dysmetria noted.  GAIT/STANCE: Push-up to get up from seated position, rely on her cane, antalgic  REVIEW OF SYSTEMS:  Full 14 system review of systems performed and notable only for as above All other review of systems were negative.   ALLERGIES: Allergies  Allergen Reactions   Ketek [Telithromycin] Shortness Of Breath   Toprol Xl [Metoprolol] Other (See Comments)    Caused hands to hurt   Cephalosporins Swelling and Other (See Comments)    Urinary retention   Codeine Itching   Durezol [Difluprednate] Other (See Comments)    Eye redness   Flagyl [Metronidazole] Swelling   Levaquin [Levofloxacin] Swelling and Other (See Comments)    Headache   Maxzide [Hydrochlorothiazide W-Triamterene] Swelling    Low urine output   Mobic [Meloxicam] Swelling and Other (See Comments)    Low urine output   Motrin [Ibuprofen] Other (See Comments)    Told to avoid due to kidney function   Nsaids Other (See Comments)    Told to avoid due to kidney function   Prednisone Swelling    Low urine  output    Vistaril [Hydroxyzine] Other (See Comments)    Short term memory loss    HOME MEDICATIONS: Current Outpatient Medications  Medication Sig Dispense Refill   acetaminophen (TYLENOL) 500 MG tablet Take 500 mg by mouth daily as needed for headache.     albuterol (VENTOLIN HFA) 108 (90 Base) MCG/ACT inhaler Inhale 2 puffs into the lungs every 6 (six) hours as needed for wheezing or shortness of breath.     amitriptyline (ELAVIL) 50 MG tablet Take 50 mg by mouth at bedtime.     aspirin EC 81 MG tablet Take 1 tablet (81 mg total) by mouth daily. 30 tablet 3   atenolol (TENORMIN) 50 MG tablet Take 1 tablet (50 mg total) by mouth daily. 30 tablet 3   Carboxymethylcellul-Glycerin (LUBRICATING EYE DROPS OP) Place 1 drop into both eyes daily as needed (dry eyes).     cetirizine (ZYRTEC) 10 MG tablet Take 10 mg by mouth daily.     furosemide (LASIX) 40 MG tablet Take 20 mg by mouth See admin instructions.  1/2 tablet (20 mg) every morning, takes the other 1/2 tablet (20 mg) later in the day if needed for swelling     levETIRAcetam (KEPPRA) 500 MG tablet Take 1 tablet (500 mg total) by mouth 2 (two) times daily. 60 tablet 2   omeprazole (PRILOSEC) 20 MG capsule Take 20 mg by mouth daily.     polyethylene glycol powder (GLYCOLAX/MIRALAX) 17 GM/SCOOP powder Take 17 g by mouth daily as needed for mild constipation.     potassium chloride SA (K-DUR,KLOR-CON) 20 MEQ tablet Take 20 mEq by mouth daily.     rosuvastatin (CRESTOR) 20 MG tablet Take 1 tablet (20 mg total) by mouth daily. 30 tablet 3   No current facility-administered medications for this visit.    PAST MEDICAL HISTORY: Past Medical History:  Diagnosis Date   Acute meniscal injury of left knee    Angioedema 03/28/6008   Complication of anesthesia    hypotention   Constipation    Diverticulitis    Dyspnea    spring needs inhaler   GERD (gastroesophageal reflux disease)    Hypertension    Hypoglycemia, unspecified    IC  (interstitial cystitis)    Memory difficulties 07/18/2016   Sleep apnea    STOP-Bang =  5   Swelling of left knee joint     PAST SURGICAL HISTORY: Past Surgical History:  Procedure Laterality Date   HEMORROIDECTOMY  1970's   KNEE ARTHROSCOPY Right 2009   KNEE ARTHROSCOPY Left 10/09/2012   Procedure: LEFT ARTHROSCOPY KNEE WITH medial meiscal DEBRIDEMENT;  Surgeon: Gearlean Alf, MD;  Location: [facility];  Service: Orthopedics;  Laterality: Left;   LAPAROSCOPIC LYSIS OF ADHESIONS  1980'S   TONSILLECTOMY AND ADENOIDECTOMY  AGE 41   TOTAL ABDOMINAL HYSTERECTOMY W/ BILATERAL SALPINGOOPHORECTOMY  AGE 70   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   Procedure: TOTAL HIP ARTHROPLASTY ANTERIOR APPROACH;  Surgeon: Paralee Cancel, MD;  Location: [facility];  Service: Orthopedics;  Laterality: Right;    FAMILY HISTORY: Family History  Problem Relation Age of Onset   Heart disease Mother    Alzheimer's disease Mother    Prostate cancer Father    Diabetes Sister    Diabetes Brother    Liver disease Brother    Asthma Grandson    Allergic rhinitis Grandson    Eczema Grandson    Allergic rhinitis Daughter    Allergic rhinitis Daughter    Allergic rhinitis Son    Food Allergy Granddaughter    Immunodeficiency Neg Hx    Urticaria Neg Hx    Angioedema Neg Hx    Atopy Neg Hx     SOCIAL HISTORY: Social History   Socioeconomic History   Marital status: Legally Separated    Spouse name: Not on file   Number of children: 3   Years of education: 14   Highest education level: Not on file  Occupational History   Occupation: Retired  Tobacco Use   Smoking status: Former    Packs/day: 0.50    Years: 20.00    Total pack years: 10.00    Types: Cigarettes    Quit date: 10/02/1992    Years since quitting: 29.0   Smokeless tobacco: Never  Vaping Use   Vaping Use: Never used  Substance and Sexual Activity   Alcohol use: No   Drug use: No   Sexual activity: Not on file  Other  Topics Concern   Not on file  Social History Narrative   Lives alone  Caffeine use: Drinks water and coffee daily   Soda rare   Right handed    Social Determinants of Health   Financial Resource Strain: Not on file  Food Insecurity: No Food Insecurity (10/11/2021)   Hunger Vital Sign    Worried About Running Out of Food in the Last Year: Never true    Ran Out of Food in the Last Year: Never true  Transportation Needs: No Transportation Needs (10/11/2021)   PRAPARE - Hydrologist (Medical): No    Lack of Transportation (Non-Medical): No  Physical Activity: Not on file  Stress: Not on file  Social Connections: Not on file  Intimate Partner Violence: Not At Risk (10/11/2021)   Humiliation, Afraid, Rape, and Kick questionnaire    Fear of Current or Ex-Partner: No    Emotionally Abused: No    Physically Abused: No    Sexually Abused: No      Marcial Pacas, M.D. Ph.D.  [facility] [address] [place], [place] [address] Ph: [phone] Fax: [phone]  CC:  Rosalin Hawking, MD [place],  [place] [address]  [employer], [facility]

## 2021-10-17 LAB — RPR: RPR Ser Ql: NONREACTIVE

## 2021-10-17 LAB — SEDIMENTATION RATE: Sed Rate: 8 mm/hr (ref 0–40)

## 2021-10-17 LAB — VITAMIN B12: Vitamin B-12: 420 pg/mL (ref 232–1245)

## 2021-10-17 LAB — C-REACTIVE PROTEIN: CRP: 5 mg/L (ref 0–10)

## 2021-10-17 LAB — ANA W/REFLEX IF POSITIVE: Anti Nuclear Antibody (ANA): NEGATIVE

## 2021-10-17 LAB — VITAMIN D 25 HYDROXY (VIT D DEFICIENCY, FRACTURES): Vit D, 25-Hydroxy: 56.1 ng/mL (ref 30.0–100.0)

## 2021-10-17 LAB — CK: Total CK: 75 U/L (ref 32–182)

## 2021-10-17 LAB — TSH: TSH: 1.25 u[IU]/mL (ref 0.450–4.500)

## 2021-10-22 NOTE — Progress Notes (Unsigned)
Cardiology Office Note:    Date:  10/23/2021   ID:  Marissa Dodson, DOB August 29, 1940, MRN [id number]  PCP:  Gustavus Bryant, [facility]   [facility] Providers Cardiologist:  Lesleigh Noe, MD     Referring MD: Gabriel Cirri*   CC: Here for follow-up  History of Present Illness:    Marissa Dodson is a 81 y.o. female with a hx of the following:  Coronary artery calcification HLD, intolerance to statins Carotid artery disease HTN Hx of TIA Depression Memory difficulties/word finding difficulty GERD OSA Bradycardia  Was initially evaluated by Dr. Katrinka Blazing in 2021 for shortness of breath.  Echocardiogram revealed normal EF, mild DD.  Pro BNP was normal.  CT cardiac scoring revealed coronary calcium score of 245, was recommended to start Crestor however patient stopped taking this due to side effects.  She was last seen in the cardiology office for preoperative cardiovascular assessment by Tereso Newcomer, PA on October 03, 2020.  She was to undergo right total hip replacement later that month under spinal anesthesia.  Overall was doing well from a cardiac perspective and was able to do most activities at home alone.  Denied any shortness of breath, syncope, or chest pain.  Did admit to some swelling in her right leg that was progressive the longer she was on her feet, seem to be related to arthritis.  She presented to [facility] ED in December 2022 for atypical chest pain, as well as neck pain and left arm pain that was ongoing for over a day.  EKG revealed sinus rhythm with sinus arrhythmia, 64 bpm, no ischemic changes.  Troponin normal, lab work overall unremarkable.  Chest x-ray revealed some nonspecific possible diffuse bilateral opacity, overall unremarkable.  Was overall suspected to be musculoskeletal in nature.  Was told to follow-up with PCP.    Presented to [facility] ED on October 10, 2021 with chief complaint of leg pain  and altered mental status, reported difficulty speaking 2 weeks prior and thought speech was slurred at that time, symptoms resolved shortly afterwards, admitted to memory difficulties.  Had another episode of difficulty speaking with possible slurred speech, lasted for 10 minutes, occurred the day before ED arrival.  CT head negative for anything acute.  Labs unremarkable, however was given antibiotics for possible UTI.  Was admitted for TIA evaluation to [facility], neurology consulted.  MRI of brain unremarkable.  EEG showed cortical dysfunction left > right.  Neurology started LTM EEG and recommended Keppra 500 mg twice daily on discharge, follow-up with neurology as outpatient.  Carotid ultrasound showed bilateral carotid arteries with no significant stenosis.  Echocardiogram EF 70-75%, and RWMA, mild LVH.  Heart rate in 50s during hospitalization, atenolol dose was decreased to 50 mg daily.  She developed palpitations and chest discomfort during hospitalization, troponin negative x2, EKG was unremarkable, was recommended follow-up with cardiology outpatient.  Today she presents for 1 year follow-up. She states she is doing better since her hospital visit.  Says she is no longer taking Lamictal as advised by her neurologist.  She is just taking Keppra.  Stated she could not handle Lamictal and this was "too much for her".  Does admit to some shortness of breath with activity, has noticed this since August since moving in with daughter.  Attributes this to stress, but says her breathing seems to be better and improved since she was in the hospital.  No longer taking Crestor,  cannot tolerate this.  Has a history of statin intolerance in the past to other statins.  Denies any chest pain, palpitations, syncope, dizziness, presyncope, lightheadedness, orthopnea, PND, claudication, or any bleeding.  She is requesting not to be communicated through [place].  Denies any other questions or concerns today.  She is a  retired Personal assistant.  Past Medical History:  Diagnosis Date   Acute meniscal injury of left knee    Angioedema 3/41/9622   Complication of anesthesia    hypotention   Constipation    Diverticulitis    Dyspnea    spring needs inhaler   GERD (gastroesophageal reflux disease)    Hypertension    Hypoglycemia, unspecified    IC (interstitial cystitis)    Memory difficulties 07/18/2016   Sleep apnea    STOP-Bang =  5   Swelling of left knee joint     Past Surgical History:  Procedure Laterality Date   HEMORROIDECTOMY  1970's   KNEE ARTHROSCOPY Right 2009   KNEE ARTHROSCOPY Left 10/09/2012   Procedure: LEFT ARTHROSCOPY KNEE WITH medial meiscal DEBRIDEMENT;  Surgeon: Gearlean Alf, MD;  Location: [place];  Service: Orthopedics;  Laterality: Left;   LAPAROSCOPIC LYSIS OF ADHESIONS  1980'S   TONSILLECTOMY AND ADENOIDECTOMY  AGE 44   TOTAL ABDOMINAL HYSTERECTOMY W/ BILATERAL SALPINGOOPHORECTOMY  AGE 58   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   Procedure: TOTAL HIP ARTHROPLASTY ANTERIOR APPROACH;  Surgeon: Paralee Cancel, MD;  Location: [facility];  Service: Orthopedics;  Laterality: Right;    Current Medications: Current Meds  Medication Sig   acetaminophen (TYLENOL) 500 MG tablet Take 500 mg by mouth daily as needed for headache.   albuterol (VENTOLIN HFA) 108 (90 Base) MCG/ACT inhaler Inhale 2 puffs into the lungs every 6 (six) hours as needed for wheezing or shortness of breath.   amitriptyline (ELAVIL) 50 MG tablet Take 50 mg by mouth at bedtime.   aspirin EC 81 MG tablet Take 1 tablet (81 mg total) by mouth daily.   atenolol (TENORMIN) 50 MG tablet Take 1 tablet by mouth daily, may take extra 1/2 dose daily as needed for systolic bp over 297   Carboxymethylcellul-Glycerin (LUBRICATING EYE DROPS OP) Place 1 drop into both eyes daily as needed (dry eyes).   cetirizine (ZYRTEC) 10 MG tablet Take 10 mg by mouth daily.   furosemide (LASIX) 40 MG tablet Take 20 mg  by mouth See admin instructions. 1/2 tablet (20 mg) every morning, takes the other 1/2 tablet (20 mg) later in the day if needed for swelling   omeprazole (PRILOSEC) 20 MG capsule Take 20 mg by mouth daily.   polyethylene glycol powder (GLYCOLAX/MIRALAX) 17 GM/SCOOP powder Take 17 g by mouth daily as needed for mild constipation.   potassium chloride SA (K-DUR,KLOR-CON) 20 MEQ tablet Take 20 mEq by mouth daily.   atenolol (TENORMIN) 50 MG tablet Take 1 tablet (50 mg total) by mouth daily.     Allergies:   Ketek [telithromycin], Toprol xl [metoprolol], Cephalosporins, Codeine, Durezol [difluprednate], Flagyl [metronidazole], Levaquin [levofloxacin], Maxzide [hydrochlorothiazide w-triamterene], Mobic [meloxicam], Motrin [ibuprofen], Nsaids, Prednisone, and Vistaril [hydroxyzine]   Social History   Socioeconomic History   Marital status: Legally Separated    Spouse name: Not on file   Number of children: 3   Years of education: 14   Highest education level: Not on file  Occupational History   Occupation: Retired  Tobacco Use   Smoking status: Former    Packs/day: 0.50  Years: 20.00    Total pack years: 10.00    Types: Cigarettes    Quit date: 10/02/1992    Years since quitting: 29.0   Smokeless tobacco: Never  Vaping Use   Vaping Use: Never used  Substance and Sexual Activity   Alcohol use: No   Drug use: No   Sexual activity: Not on file  Other Topics Concern   Not on file  Social History Narrative   Lives alone   Caffeine use: Drinks water and coffee daily   Soda rare   Right handed    Social Determinants of Health   Financial Resource Strain: Not on file  Food Insecurity: No Food Insecurity (10/11/2021)   Hunger Vital Sign    Worried About Running Out of Food in the Last Year: Never true    Ran Out of Food in the Last Year: Never true  Transportation Needs: No Transportation Needs (10/11/2021)   PRAPARE - Administrator, Civil ServiceTransportation    Lack of Transportation (Medical): No    Lack  of Transportation (Non-Medical): No  Physical Activity: Not on file  Stress: Not on file  Social Connections: Not on file     Family History: The patient's family history includes Allergic rhinitis in her daughter, daughter, grandson, and son; Alzheimer's disease in her mother; Asthma in her grandson; Diabetes in her brother and sister; Eczema in her grandson; Food Allergy in her granddaughter; Heart disease in her mother; Liver disease in her brother; Prostate cancer in her father. There is no history of Immunodeficiency, Urticaria, Angioedema, or Atopy.  ROS:   Please see the history of present illness.    All other systems reviewed and are negative.  EKGs/Labs/Other Studies Reviewed:    The following studies were reviewed today:   EKG:  EKG is not ordered today.  Previous EKG in hospital revealed normal sinus rhythm, 85 bpm, nothing acute.    2D echo complete on October 11, 2021: 1. Left ventricular ejection fraction, by estimation, is 70 to 75%. Left  ventricular ejection fraction by 2D MOD biplane is 73.2 %. The left  ventricle has hyperdynamic function. The left ventricle has no regional  wall motion abnormalities. There is mild   left ventricular hypertrophy. Left ventricular diastolic parameters are  consistent with Grade I diastolic dysfunction (impaired relaxation).   2. Right ventricular systolic function is normal. The right ventricular  size is normal. There is normal pulmonary artery systolic pressure. The  estimated right ventricular systolic pressure is 17.7 mmHg.   3. The mitral valve was not well visualized. No evidence of mitral valve  regurgitation.   4. The aortic valve is tricuspid. Aortic valve regurgitation is not  visualized.   5. The inferior vena cava is normal in size with greater than 50%  respiratory variability, suggesting right atrial pressure of 3 mmHg.   Comparison(s): Changes from prior study are noted. 08/31/2019: LVEF 60-65%.   Bilateral  carotid duplex on October 11, 2021: Right carotid: Velocities in the right ICA are consistent with a 1 to 39% stenosis.  Left carotid: The extracranial vessels were near normal with only minimal wall thickening or plaque.  Vertebrals: Bilateral vertebral arteries demonstrate antegrade flow.  Subclavian's: Normal flow hemodynamics were seen in bilateral subclavian arteries.  CT cardiac scoring on August 31, 2019: Impression: Coronary calcium score 245.  This was 5479 percentile for age and sex matched control.  Aortic atherosclerosis and bibasilar scarring noted with no acute findings noted on CT.    Recent Labs:  10/10/2021: ALT 12; BUN 19; Creatinine, Ser 1.05; Potassium 3.8; Sodium 138 10/12/2021: Hemoglobin 14.1; Platelets 312 10/16/2021: TSH 1.250  Recent Lipid Panel    Component Value Date/Time   CHOL 191 10/11/2021 0526   CHOL 165 11/12/2019 1007   TRIG 79 10/11/2021 0526   HDL 61 10/11/2021 0526   HDL 72 11/12/2019 1007   CHOLHDL 3.1 10/11/2021 0526   VLDL 16 10/11/2021 0526   LDLCALC 114 (H) 10/11/2021 0526   LDLCALC 77 11/12/2019 1007     Risk Assessment/Calculations:      HYPERTENSION CONTROL Vitals:   10/23/21 1328 10/23/21 1350  BP: (!) 154/88 (!) 155/80    The patient's blood pressure is elevated above target today.  In order to address the patient's elevated BP: Blood pressure will be monitored at home to determine if medication changes need to be made.; A current anti-hypertensive medication was adjusted today.; The blood pressure is usually elevated in clinic.  Blood pressures monitored at home have been optimal.            Physical Exam:    VS:  BP (!) 155/80 (BP Location: Right Arm, Patient Position: Sitting, Cuff Size: Normal)   Pulse 79   Ht 5\' 3"  (1.6 m)   Wt 213 lb 9.6 oz (96.9 kg)   SpO2 93%   BMI 37.84 kg/m     Wt Readings from Last 3 Encounters:  10/23/21 213 lb 9.6 oz (96.9 kg)  10/16/21 214 lb (97.1 kg)  10/10/21 212 lb (96.2 kg)      GEN: Obese, 81 y.o. African American female in no acute distress HEENT: Normal NECK: No JVD; No carotid bruits CARDIAC: S1/S2, overall regular rate and rhythm, some PVCs noted on exam, no murmurs, rubs, gallops; 2+ peripheral pulses throughout, strong and equal bilaterally RESPIRATORY:  Clear and diminished to auscultation without rales, wheezing or rhonchi  ABDOMEN: Soft, non-tender, non-distended, bowel sounds x4 MUSCULOSKELETAL: Bilateral lower extremity edema, dependent, +1; No deformity  SKIN: Warm and dry NEUROLOGIC:  Alert and oriented x 3 PSYCHIATRIC:  Normal affect   ASSESSMENT:    1. Coronary artery calcification seen on CT scan   2. Hyperlipidemia, unspecified hyperlipidemia type   3. Carotid artery disease, unspecified laterality, unspecified type (HCC)   4. Bradycardia   5. Hypertension, unspecified type   6. TIA (transient ischemic attack)   7. OSA (obstructive sleep apnea)    PLAN:    In order of problems listed above:  Coronary artery calcification seen on CT scan, hyperlipidemia Coronary calcium score of 245 seen in 2021.  Was recommended to start Crestor, however she has stopped taking this and cannot tolerate Crestor or other statins.  Last LDL was 114 on 10/11/2021 and not at goal.  Today she is requesting to work on lifestyle changes to lower cholesterol levels.  We will repeat fasting lipid panel and liver function test at next office visit.  If LDL is still not at goal, plan to initiate Zetia or fenofibrate.  Heart healthy diet and exercise encouraged.  Will remove Crestor from medication list and added to allergy list.  Carotid artery disease Recent carotid duplex revealed right ICA 1 to 39% stenosis, left carotid vessels were near normal with only minimal thickening or plaque.  Unable to tolerate Crestor.  Will work on lifestyle modifications and obtain FLP and LFT at next office visit.  If LDL is still not at goal plan to initiate Zetia or fenofibrate.    Bradycardia Resolved with decreasing  beta-blocker.  Heart rate today is 79.  Denies any recent low heart rates, tachycardia, or palpitations.  Continue atenolol 50 mg daily.  May take additional 25 mg tablet of atenolol as needed for high blood pressure-see below.  Hypertension BP on arrival 154/88, repeat BP by me 155/80.  Does admit to some whitecoat hypertension.  Discussed logging blood pressure and if SBP is consistently greater than 140, plan to switch to low dose metoprolol succinate.  Heart healthy diet and regular physical activity encouraged.  She verbalized understanding.  Continue current medication regimen.  TIA No more recurrent neurological symptoms.  Continue to follow-up with neurology.  Will remove Lamictal from medication list and add to allergy list.  We will add Keppra back to medication list.  OSA Currently not on any CPAP-says she has a history of claustrophobia.  Defers sleep study at this time.    7.  Disposition: Follow-up in 3 months with Dr. Katrinka Blazing or APP or sooner if anything changes.    Medication Adjustments/Labs and Tests Ordered: Current medicines are reviewed at length with the patient today.  Concerns regarding medicines are outlined above.  Orders Placed This Encounter  Procedures   Lipid panel   Hepatic function panel   Meds ordered this encounter  Medications   atenolol (TENORMIN) 50 MG tablet    Sig: Take 1 tablet by mouth daily, may take extra 1/2 dose daily as needed for systolic bp over 161    Dispense:  135 tablet    Refill:  3    Patient Instructions  Medication Instructions:  Your physician has recommended you make the following change in your medication:   You may take extra 1/2 tablet of Atenolol if your top blood pressure is over 140  *If you need a refill on your cardiac medications before your next appointment, please call your pharmacy*   Lab Work: 3 MONTHS:  FASTING LIPID & LFT (can be done at appointment)  If you have  labs (blood work) drawn today and your tests are completely normal, you will receive your results only by: MyChart Message (if you have MyChart) OR A paper copy in the mail If you have any lab test that is abnormal or we need to change your treatment, we will call you to review the results.   Testing/Procedures: None ordered   Follow-Up: At [facility], you and your health needs are our priority.  As part of our continuing mission to provide you with exceptional heart care, we have created designated Provider Care Teams.  These Care Teams include your primary Cardiologist (physician) and Advanced Practice Providers (APPs -  Physician Assistants and Nurse Practitioners) who all work together to provide you with the care you need, when you need it.  We recommend signing up for the patient portal called "MyChart".  Sign up information is provided on this After Visit Summary.  MyChart is used to connect with patients for Virtual Visits (Telemedicine).  Patients are able to view lab/test results, encounter notes, upcoming appointments, etc.  Non-urgent messages can be sent to your provider as well.   To learn more about what you can do with MyChart, go to [email or web address].    Your next appointment:   3 month(s)  The format for your next appointment:   In Person  Provider:   Lesleigh Noe, MD  or Tereso Newcomer, PA-C         Other Instructions Monitor your blood pressure.  Call and let us  know if your top # is consistently over 140   Important Information About Sugar         Signed, Sharlene Dory, NP  10/23/2021 2:15 PM    [facility]

## 2021-10-23 ENCOUNTER — Ambulatory Visit: Payer: Medicare HMO | Attending: Physician Assistant | Admitting: Nurse Practitioner

## 2021-10-23 ENCOUNTER — Encounter: Payer: Self-pay | Admitting: Nurse Practitioner

## 2021-10-23 VITALS — BP 155/80 | HR 79 | Ht 63.0 in | Wt 213.6 lb

## 2021-10-23 DIAGNOSIS — I779 Disorder of arteries and arterioles, unspecified: Secondary | ICD-10-CM

## 2021-10-23 DIAGNOSIS — I2584 Coronary atherosclerosis due to calcified coronary lesion: Secondary | ICD-10-CM

## 2021-10-23 DIAGNOSIS — G4733 Obstructive sleep apnea (adult) (pediatric): Secondary | ICD-10-CM | POA: Insufficient documentation

## 2021-10-23 DIAGNOSIS — I2510 Atherosclerotic heart disease of native coronary artery without angina pectoris: Secondary | ICD-10-CM | POA: Diagnosis not present

## 2021-10-23 DIAGNOSIS — I10 Essential (primary) hypertension: Secondary | ICD-10-CM

## 2021-10-23 DIAGNOSIS — R001 Bradycardia, unspecified: Secondary | ICD-10-CM | POA: Diagnosis not present

## 2021-10-23 DIAGNOSIS — E785 Hyperlipidemia, unspecified: Secondary | ICD-10-CM | POA: Diagnosis not present

## 2021-10-23 DIAGNOSIS — G459 Transient cerebral ischemic attack, unspecified: Secondary | ICD-10-CM

## 2021-10-23 HISTORY — DX: Bradycardia, unspecified: R00.1

## 2021-10-23 HISTORY — DX: Atherosclerotic heart disease of native coronary artery without angina pectoris: I25.10

## 2021-10-23 HISTORY — DX: Disorder of arteries and arterioles, unspecified: I77.9

## 2021-10-23 MED ORDER — ATENOLOL 50 MG PO TABS: ORAL_TABLET | ORAL | 3 refills | Status: DC

## 2021-10-23 NOTE — Patient Instructions (Addendum)
Medication Instructions:  Your physician has recommended you make the following change in your medication:   You may take extra 1/2 tablet of Atenolol if your top blood pressure is over 140  *If you need a refill on your cardiac medications before your next appointment, please call your pharmacy*   Lab Work: 3 MONTHS:  FASTING LIPID & LFT (can be done at appointment)  If you have labs (blood work) drawn today and your tests are completely normal, you will receive your results only by: [place] (if you have MyChart) OR A paper copy in the mail If you have any lab test that is abnormal or we need to change your treatment, we will call you to review the results.   Testing/Procedures: None ordered   Follow-Up: At [facility], you and your health needs are our priority.  As part of our continuing mission to provide you with exceptional heart care, we have created designated Provider Care Teams.  These Care Teams include your primary Cardiologist (physician) and Advanced Practice Providers (APPs -  Physician Assistants and Nurse Practitioners) who all work together to provide you with the care you need, when you need it.  We recommend signing up for the patient portal called "MyChart".  Sign up information is provided on this After Visit Summary.  MyChart is used to connect with patients for Virtual Visits (Telemedicine).  Patients are able to view lab/test results, encounter notes, upcoming appointments, etc.  Non-urgent messages can be sent to your provider as well.   To learn more about what you can do with MyChart, go to [email or web address].    Your next appointment:   3 month(s)  The format for your next appointment:   In Person  Provider:   Sinclair Grooms, MD  or Richardson Dopp, PA-C         Other Instructions Monitor your blood pressure.  Call and let us know if your top # is consistently over 140   Important Information About Sugar

## 2021-11-19 DIAGNOSIS — I509 Heart failure, unspecified: Secondary | ICD-10-CM | POA: Insufficient documentation

## 2021-12-09 ENCOUNTER — Other Ambulatory Visit: Payer: Self-pay | Admitting: Physician Assistant

## 2021-12-09 MED ORDER — POTASSIUM CHLORIDE CRYS ER 20 MEQ PO TBCR: 20.0000 meq | EXTENDED_RELEASE_TABLET | Freq: Every day | ORAL | 3 refills | Status: DC

## 2021-12-21 ENCOUNTER — Encounter (HOSPITAL_BASED_OUTPATIENT_CLINIC_OR_DEPARTMENT_OTHER): Payer: Self-pay | Admitting: Pediatrics

## 2021-12-21 ENCOUNTER — Emergency Department (HOSPITAL_BASED_OUTPATIENT_CLINIC_OR_DEPARTMENT_OTHER): Payer: Medicare HMO

## 2021-12-21 ENCOUNTER — Emergency Department (HOSPITAL_BASED_OUTPATIENT_CLINIC_OR_DEPARTMENT_OTHER)
Admission: EM | Admit: 2021-12-21 | Discharge: 2021-12-21 | Disposition: A | Discharge status: Home / Self Care | Payer: Medicare HMO | Attending: Emergency Medicine | Admitting: Emergency Medicine

## 2021-12-21 ENCOUNTER — Other Ambulatory Visit: Payer: Self-pay

## 2021-12-21 DIAGNOSIS — H81399 Other peripheral vertigo, unspecified ear: Secondary | ICD-10-CM | POA: Diagnosis not present

## 2021-12-21 DIAGNOSIS — Z79899 Other long term (current) drug therapy: Secondary | ICD-10-CM | POA: Insufficient documentation

## 2021-12-21 DIAGNOSIS — R42 Dizziness and giddiness: Secondary | ICD-10-CM | POA: Diagnosis present

## 2021-12-21 DIAGNOSIS — Z7982 Long term (current) use of aspirin: Secondary | ICD-10-CM | POA: Insufficient documentation

## 2021-12-21 DIAGNOSIS — H9201 Otalgia, right ear: Secondary | ICD-10-CM | POA: Insufficient documentation

## 2021-12-21 DIAGNOSIS — I10 Essential (primary) hypertension: Secondary | ICD-10-CM | POA: Insufficient documentation

## 2021-12-21 LAB — BASIC METABOLIC PANEL
Anion gap: 9 (ref 5–15)
BUN: 18 mg/dL (ref 8–23)
CO2: 27 mmol/L (ref 22–32)
Calcium: 8.9 mg/dL (ref 8.9–10.3)
Chloride: 101 mmol/L (ref 98–111)
Creatinine, Ser: 1.07 mg/dL — ABNORMAL HIGH (ref 0.44–1.00)
GFR, Estimated: 53 mL/min — ABNORMAL LOW (ref >60)
Glucose, Bld: 125 mg/dL — ABNORMAL HIGH (ref 70–99)
Potassium: 3.6 mmol/L (ref 3.5–5.1)
Sodium: 137 mmol/L (ref 135–145)

## 2021-12-21 LAB — CBC
HCT: 44.4 % (ref 36.0–46.0)
Hemoglobin: 14.2 g/dL (ref 12.0–15.0)
MCH: 28.6 pg (ref 26.0–34.0)
MCHC: 32 g/dL (ref 30.0–36.0)
MCV: 89.5 fL (ref 80.0–100.0)
Platelets: 321 10*3/uL (ref 150–400)
RBC: 4.96 MIL/uL (ref 3.87–5.11)
RDW: 12.4 % (ref 11.5–15.5)
WBC: 9.3 10*3/uL (ref 4.0–10.5)
nRBC: 0 % (ref 0.0–0.2)

## 2021-12-21 LAB — MAGNESIUM: Magnesium: 2 mg/dL (ref 1.7–2.4)

## 2021-12-21 LAB — PHOSPHORUS: Phosphorus: 2.8 mg/dL (ref 2.5–4.6)

## 2021-12-21 LAB — CBG MONITORING, ED: Glucose-Capillary: 102 mg/dL — ABNORMAL HIGH (ref 70–99)

## 2021-12-21 MED ORDER — ONDANSETRON HCL 4 MG PO TABS: 4.0000 mg | ORAL_TABLET | Freq: Four times a day (QID) | ORAL | 0 refills | Status: AC

## 2021-12-21 MED ORDER — LACTATED RINGERS IV BOLUS
1000.0000 mL | Freq: Once | INTRAVENOUS | Status: AC
  Administered 2021-12-21: 1000 mL via INTRAVENOUS

## 2021-12-21 MED ORDER — MECLIZINE HCL 25 MG PO TABS: 25.0000 mg | ORAL_TABLET | Freq: Three times a day (TID) | ORAL | 0 refills | Status: AC | PRN

## 2021-12-21 MED ORDER — ONDANSETRON HCL 4 MG/2ML IJ SOLN
4.0000 mg | Freq: Once | INTRAMUSCULAR | Status: AC
  Administered 2021-12-21: 4 mg via INTRAVENOUS
  Filled 2021-12-21: qty 2

## 2021-12-21 MED ORDER — IOHEXOL 350 MG/ML SOLN
75.0000 mL | Freq: Once | INTRAVENOUS | Status: AC | PRN
  Administered 2021-12-21: 75 mL via INTRAVENOUS

## 2021-12-21 MED ORDER — MECLIZINE HCL 25 MG PO TABS
25.0000 mg | ORAL_TABLET | Freq: Once | ORAL | Status: AC
  Administered 2021-12-21: 25 mg via ORAL
  Filled 2021-12-21: qty 1

## 2021-12-21 NOTE — ED Provider Notes (Signed)
[facility] EMERGENCY DEPARTMENT Provider Note   CSN: [id number] Arrival date & time: 12/21/21  1503     History  Chief Complaint  Patient presents with   Near Syncope    Tiana LoftLauretta S Husband is a 81 y.o. female.  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  Patient states just prior to arrival she was sitting down watching TV when she had sudden onset of dizziness.  She states that she felt like everything was spinning and also felt lightheaded like she might pass out.  She denied any headache or chest pain.  She states that she did feel short of breath.  She states that she had nausea and vomiting.  She states that she has had some right-sided ear pain with ringing in her ear.  She denies any numbness or weakness or difficulty with walking.  She states that this is never happened to her before.  The history is provided by the patient and a relative.  Near Syncope       Home Medications Prior to Admission medications   Medication Sig Start Date End Date Taking? Authorizing Provider  meclizine (ANTIVERT) 25 MG tablet Take 1 tablet (25 mg total) by mouth 3 (three) times daily as needed for dizziness. 12/21/21  Yes Theresia LoKingsley, TurkeyVictoria K, DO  ondansetron (ZOFRAN) 4 MG tablet Take 1 tablet (4 mg total) by mouth every 6 (six) hours. 12/21/21  Yes Elayne SnareKingsley, Larah Kuntzman K, DO  acetaminophen (TYLENOL) 500 MG tablet Take 500 mg by mouth daily as needed for headache.    [provider]  albuterol (VENTOLIN HFA) 108 (90 Base) MCG/ACT inhaler Inhale 2 puffs into the lungs every 6 (six) hours as needed for wheezing or shortness of breath.    [provider]  amitriptyline (ELAVIL) 50 MG tablet Take 50 mg by mouth at bedtime.    [provider]  aspirin EC 81 MG tablet Take 1 tablet (81 mg total) by mouth daily. 10/13/21   Meredeth IdeLama, Gagan S, MD   atenolol (TENORMIN) 50 MG tablet Take 1 tablet by mouth daily, may take extra 1/2 dose daily as needed for systolic bp over 409140 10/23/21   Sharlene DoryPeck, Elizabeth, NP  Carboxymethylcellul-Glycerin (LUBRICATING EYE DROPS OP) Place 1 drop into both eyes daily as needed (dry eyes).    [provider]  cetirizine (ZYRTEC) 10 MG tablet Take 10 mg by mouth daily.    [provider]  furosemide (LASIX) 40 MG tablet Take 20 mg by mouth See admin instructions. 1/2 tablet (20 mg) every morning, takes the other 1/2 tablet (20 mg) later in the day if needed for swelling    [provider]  lamoTRIgine (LAMICTAL) 100 MG tablet Take 1 tablet (100 mg total) by mouth 2 (two) times daily. Patient not taking: Reported on 10/23/2021 10/16/21   Levert FeinsteinYan, Yijun, MD  omeprazole (PRILOSEC) 20 MG capsule Take 20 mg by mouth daily. 07/17/16   [provider]  polyethylene glycol powder (GLYCOLAX/MIRALAX) 17 GM/SCOOP powder Take 17 g by mouth daily as needed for mild constipation. 08/30/20   [provider]  potassium chloride SA (KLOR-CON M) 20 MEQ tablet Take 1 tablet (20 mEq total) by mouth daily. 12/09/21   Janetta Horahompson, Kathryn R, PA-C  rosuvastatin (CRESTOR) 20 MG tablet Take 1 tablet (20 mg total) by mouth daily. Patient not taking: Reported on 10/23/2021 10/14/21   Meredeth IdeLama, Gagan S,  MD      Allergies    Ketek [telithromycin], Toprol xl [metoprolol], Cephalosporins, Codeine, Crestor [rosuvastatin], Durezol [difluprednate], Flagyl [metronidazole], Lamictal [lamotrigine], Levaquin [levofloxacin], Maxzide [hydrochlorothiazide w-triamterene], Mobic [meloxicam], Motrin [ibuprofen], Nsaids, Prednisone, and Vistaril [hydroxyzine]    Review of Systems   Review of Systems  Cardiovascular:  Positive for near-syncope.    Physical Exam Updated Vital Signs BP 133/65   Pulse 67   Temp 97.6 F (36.4 C)   Resp 17   Ht 5\' 3"  (1.6 m)   Wt 122.5 kg   SpO2 93%   BMI 47.83 kg/m  Physical Exam Vitals and  nursing note reviewed.  Constitutional:      General: She is not in acute distress.    Appearance: Normal appearance.  HENT:     Head: Normocephalic and atraumatic.     Right Ear: Tympanic membrane, ear canal and external ear normal.     Left Ear: Tympanic membrane, ear canal and external ear normal.     Nose: Nose normal.     Mouth/Throat:     Mouth: Mucous membranes are moist.     Pharynx: Oropharynx is clear.  Eyes:     Extraocular Movements: Extraocular movements intact.     Conjunctiva/sclera: Conjunctivae normal.     Pupils: Pupils are equal, round, and reactive to light.     Comments: No nystagmus though symptomatic dizziness with EOM  Cardiovascular:     Rate and Rhythm: Normal rate and regular rhythm.     Pulses: Normal pulses.     Heart sounds: Normal heart sounds.  Pulmonary:     Effort: Pulmonary effort is normal.     Breath sounds: Normal breath sounds.  Abdominal:     General: Abdomen is flat.     Palpations: Abdomen is soft.     Tenderness: There is no abdominal tenderness.  Musculoskeletal:        General: Normal range of motion.     Cervical back: Normal range of motion and neck supple.     Right lower leg: No edema.     Left lower leg: No edema.  Skin:    General: Skin is warm and dry.  Neurological:     General: No focal deficit present.     Mental Status: She is alert and oriented to person, place, and time.     Cranial Nerves: No cranial nerve deficit.     Sensory: No sensory deficit.     Motor: No weakness.     Coordination: Coordination normal.  Psychiatric:        Mood and Affect: Mood normal.        Behavior: Behavior normal.     ED Results / Procedures / Treatments   Labs (all labs ordered are listed, but only abnormal results are displayed) Labs Reviewed  BASIC METABOLIC PANEL - Abnormal; Notable for the following components:      Result Value   Glucose, Bld 125 (*)    Creatinine, Ser 1.07 (*)    GFR, Estimated 53 (*)    All other  components within normal limits  CBG MONITORING, ED - Abnormal; Notable for the following components:   Glucose-Capillary 102 (*)    All other components within normal limits  CBC  MAGNESIUM  PHOSPHORUS    EKG EKG Interpretation  Date/Time:  Friday December 21 2021 15:14:35 EST Ventricular Rate:  86 PR Interval:  144 QRS Duration: 92 QT Interval:  372 QTC Calculation: 445 R Axis:   -  42 Text Interpretation: Normal sinus rhythm Left axis deviation Possible Anterior infarct , age undetermined Abnormal ECG No significant change since last tracing Confirmed by Elayne Snare ([id number]) on 12/21/2021 3:39:00 PM  Radiology CT ANGIO HEAD NECK W WO CM  Result Date: 12/21/2021 CLINICAL DATA:  Near-syncope EXAM: CT ANGIOGRAPHY HEAD AND NECK TECHNIQUE: Multidetector CT imaging of the head and neck was performed using the standard protocol during bolus administration of intravenous contrast. Multiplanar CT image reconstructions and MIPs were obtained to evaluate the vascular anatomy. Carotid stenosis measurements (when applicable) are obtained utilizing NASCET criteria, using the distal internal carotid diameter as the denominator. RADIATION DOSE REDUCTION: This exam was performed according to the departmental dose-optimization program which includes automated exposure control, adjustment of the mA and/or kV according to patient size and/or use of iterative reconstruction technique. CONTRAST:  68mL OMNIPAQUE IOHEXOL 350 MG/ML SOLN COMPARISON:  MR/MRA head 10/11/2021 FINDINGS: CT HEAD FINDINGS Brain: There is no acute intracranial hemorrhage, extra-axial fluid collection, or acute infarct. Parenchymal volume is normal for age. The ventricles are normal in size. Gray-white differentiation is preserved. There is no mass lesion.  There is no mass effect or midline shift. Vascular: See below. Skull: Normal. Negative for fracture or focal lesion. Sinuses/Orbits: The paranasal sinuses are clear. Bilateral lens  implants are in place. The globes and orbits are otherwise unremarkable. Other: None. Review of the MIP images confirms the above findings CTA NECK FINDINGS Aortic arch: The imaged aortic arch is normal. The origins of the major branch vessels are patent. The subclavian arteries are patent to the level imaged. Right carotid system: The right common, internal, and external carotid arteries are patent, without hemodynamically significant stenosis or occlusion. There is no dissection or aneurysm. Left carotid system: The left common, internal, and external carotid arteries are patent, without hemodynamically significant stenosis or occlusion. There is no dissection or aneurysm. Vertebral arteries: The vertebral arteries are patent, without hemodynamically significant stenosis or occlusion. There is no dissection or aneurysm. Skeleton: There is no acute osseous abnormality or suspicious osseous lesion. There is no visible canal hematoma. Other neck: The soft tissues of the neck are unremarkable. Upper chest: The imaged lung apices are clear. Review of the MIP images confirms the above findings CTA HEAD FINDINGS Anterior circulation: There is mild calcified plaque in the carotid siphons without significant stenosis or occlusion. The bilateral MCAs are patent, without proximal stenosis or occlusion. The bilateral ACAs are patent, without proximal stenosis or occlusion. The anterior communicating artery is normal. There is no aneurysm or AVM. Posterior circulation: The bilateral V4 segments are patent. The basilar artery is patent. The major cerebellar arteries appear patent. The bilateral PCAs are patent, without proximal stenosis or occlusion. Posterior communicating artery is seen on the right but not definitely seen on the left. There is no aneurysm or AVM. Venous sinuses: Patent. Anatomic variants: None. Review of the MIP images confirms the above findings IMPRESSION: 1. Unremarkable for age noncontrast head CT with  no acute intracranial pathology. 2. Normal vasculature of the head and neck with no hemodynamically significant stenosis or occlusion. Electronically Signed   By: Lesia Hausen M.D.   On: 12/21/2021 17:42   DG Chest 2 View  Result Date: 12/21/2021 CLINICAL DATA:  Near syncopal episode. EXAM: CHEST - 2 VIEW COMPARISON:  01/17/2021 FINDINGS: The cardiac silhouette, mediastinal and hilar contours are within normal limits and stable. No acute pulmonary findings. No pleural effusions or pulmonary lesions. The bony thorax is intact. IMPRESSION: No  acute cardiopulmonary findings. Electronically Signed   By: Rudie Meyer M.D.   On: 12/21/2021 17:42    Procedures Procedures    Medications Ordered in ED Medications  ondansetron (ZOFRAN) injection 4 mg (4 mg Intravenous Given 12/21/21 1725)  meclizine (ANTIVERT) tablet 25 mg (25 mg Oral Given 12/21/21 1745)  lactated ringers bolus 1,000 mL (1,000 mLs Intravenous New Bag/Given 12/21/21 1745)  iohexol (OMNIPAQUE) 350 MG/ML injection 75 mL (75 mLs Intravenous Contrast Given 12/21/21 1658)    ED Course/ Medical Decision Making/ A&P Clinical Course as of 12/21/21 1806  Fri Dec 21, 2021  1802 On reassessment, the patient's labs and imaging are within normal range.  The patient's symptoms have resolved.  She is stable for discharge home with treatment of peripheral vertigo.  She was given strict return precautions. [VK]    Clinical Course User Index [VK] Rexford Maus, DO                           Medical Decision Making This patient presents to the ED with chief complaint(s) of dizziness with pertinent past medical history of HTN, prior TIA, ?seizures on Keppra which further complicates the presenting complaint. The complaint involves an extensive differential diagnosis and also carries with it a high risk of complications and morbidity.    The differential diagnosis includes peripheral versus central vertigo, arrhythmia, anemia, electrolyte  abnormality, dehydration  Additional history obtained: Additional history obtained from family Records reviewed outpatient cardiology and neurology records  ED Course and Reassessment: On patient's arrival to the emergency department she is awake alert and mildly uncomfortable appearing nauseous on exam.  She has no nystagmus on exam but did have reproducible symptoms with extraocular motions.  She has no obvious focal neurologic deficits, however due to her age and history of TIA, she will have CTA performed to evaluate for central cause of her dizziness.  She will additionally have labs and EKG.  She will be given meclizine and Zofran for symptomatic relief and will be reassessed.  Independent labs interpretation:  The following labs were independently interpreted: within normal range  Independent visualization of imaging: - I independently visualized the following imaging with scope of interpretation limited to determining acute life threatening conditions related to emergency care: CXR, CTA head/neck, which revealed No acute disease  Consultation: - Consulted or discussed management/test interpretation w/ external professional: N/A  Consideration for admission or further workup: Patient has no emergent condition requiring admission or further work up at this time and is stable for discharge with primary care follow up Social Determinants of health: N/A    Amount and/or Complexity of Data Reviewed Labs: ordered. Radiology: ordered.  Risk Prescription drug management.          Final Clinical Impression(s) / ED Diagnoses Final diagnoses:  Peripheral vertigo, unspecified laterality    Rx / DC Orders ED Discharge Orders          Ordered    meclizine (ANTIVERT) 25 MG tablet  3 times daily PRN        12/21/21 1804    ondansetron (ZOFRAN) 4 MG tablet  Every 6 hours        12/21/21 1804              Rexford Maus, DO 12/21/21 1806

## 2021-12-21 NOTE — Discharge Instructions (Signed)
You were seen in the emergency department for your dizziness.  Your work-up showed no signs of abnormal heart rhythms, you had normal electrolytes, no signs of anemia and no signs of stroke.  You likely have vertigo which is commonly caused by an inner ear issue.  You can take the dizziness medicine as needed for recurrent episodes of dizziness as well as the nausea medication.  You should follow-up with your primary doctor to have your symptoms rechecked.  You should return to the emergency department if you have worsening dizziness and you pass out, you have changes in your speech or have numbness or weakness on one side of the body compared to the other or if you have any other new or concerning symptoms.

## 2021-12-21 NOTE — ED Triage Notes (Signed)
C/O near syncope when seating down about 30 mins ago; reports feeling nauseous, disoriented and "my head is turned around"

## 2021-12-26 HISTORY — PX: MOUTH SURGERY: SHX715

## 2022-01-01 ENCOUNTER — Encounter: Payer: Self-pay | Admitting: Neurology

## 2022-01-01 ENCOUNTER — Ambulatory Visit: Payer: Medicare HMO | Admitting: Neurology

## 2022-01-01 VITALS — BP 157/83 | HR 72 | Ht 63.0 in | Wt 220.0 lb

## 2022-01-01 DIAGNOSIS — F418 Other specified anxiety disorders: Secondary | ICD-10-CM | POA: Diagnosis not present

## 2022-01-01 DIAGNOSIS — R4789 Other speech disturbances: Secondary | ICD-10-CM | POA: Diagnosis not present

## 2022-01-01 MED ORDER — CITALOPRAM HYDROBROMIDE 10 MG PO TABS: 10.0000 mg | ORAL_TABLET | Freq: Every day | ORAL | 11 refills | Status: DC
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
## 2022-01-13 NOTE — Progress Notes (Unsigned)
Cardiology Office Note:    Date:  01/15/2022   ID:  Marissa Dodson, DOB 1940/07/27, MRN [id number]  PCP:  Shanon Ace, MD  Cardiologist:  Sinclair Grooms, MD   Referring MD: Jinny Blossom, [facility]*   Chief Complaint  Patient presents with   Coronary Artery Disease   Hyperlipidemia    History of Present Illness:    Marissa Dodson is a 81 y.o. female with a hx of OSA (not being treated), primary hypertension, hyperlipidemia, bradycardia, GERD, bilateral carotid disease, and Coronary artery calcification with failed trial of statin therapy  Has risk factors, most of which are not treated due to intolerance and desires lifestyle changes in lieu of active therapy.  Questions that have to do with primary care related problems including possibility of UTI, positional vertigo, she does not have a primary physician this is trying to find 1.  We discussed the findings of her recent cardiac workup.  She does have elevated coronary calcium score.  She is not excited about treating cholesterol because all the medications make her feel bad.  She does have diastolic dysfunction and has dyspnea on exertion but states that it is not lifestyle altering and would prefer not to be on any particular therapy for right now.  Past Medical History:  Diagnosis Date   Acute meniscal injury of left knee    Angioedema [id number]   Complication of anesthesia    hypotention   Constipation    Diverticulitis    Dyspnea    spring needs inhaler   GERD (gastroesophageal reflux disease)    Hypertension    Hypoglycemia, unspecified    IC (interstitial cystitis)    Memory difficulties 07/18/2016   Sleep apnea    STOP-Bang =  5   Swelling of left knee joint     Past Surgical History:  Procedure Laterality Date   HEMORROIDECTOMY  1970's   KNEE ARTHROSCOPY Right 2009   KNEE ARTHROSCOPY Left 10/09/2012   Procedure: LEFT ARTHROSCOPY KNEE WITH medial meiscal DEBRIDEMENT;  Surgeon: Gearlean Alf, MD;  Location: [facility];  Service: Orthopedics;  Laterality: Left;   LAPAROSCOPIC LYSIS OF ADHESIONS  1980'S   MOUTH SURGERY  12/26/2021   TONSILLECTOMY AND ADENOIDECTOMY  AGE 36   TOTAL ABDOMINAL HYSTERECTOMY W/ BILATERAL SALPINGOOPHORECTOMY  AGE 65   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   Procedure: TOTAL HIP ARTHROPLASTY ANTERIOR APPROACH;  Surgeon: Paralee Cancel, MD;  Location: [facility];  Service: Orthopedics;  Laterality: Right;    Current Medications: Current Meds  Medication Sig   acetaminophen (TYLENOL) 500 MG tablet Take 500 mg by mouth daily as needed for headache.   albuterol (VENTOLIN HFA) 108 (90 Base) MCG/ACT inhaler Inhale 2 puffs into the lungs every 6 (six) hours as needed for wheezing or shortness of breath.   amitriptyline (ELAVIL) 50 MG tablet Take 50 mg by mouth at bedtime.   aspirin EC 81 MG tablet Take 1 tablet (81 mg total) by mouth daily.   atenolol (TENORMIN) 50 MG tablet Take 1 tablet by mouth daily, may take extra 1/2 dose daily as needed for systolic bp over [id number]   Carboxymethylcellul-Glycerin (LUBRICATING EYE DROPS OP) Place 1 drop into both eyes daily as needed (dry eyes).   cetirizine (ZYRTEC) 10 MG tablet Take 10 mg by mouth daily.   citalopram (CELEXA) 10 MG tablet Take 1 tablet (10 mg total) by mouth daily.   furosemide (LASIX) 40 MG tablet Take 20 mg  by mouth See admin instructions. 1/2 tablet (20 mg) every morning, takes the other 1/2 tablet (20 mg) later in the day if needed for swelling   meclizine (ANTIVERT) 25 MG tablet Take 1 tablet (25 mg total) by mouth 3 (three) times daily as needed for dizziness.   omeprazole (PRILOSEC) 20 MG capsule Take 20 mg by mouth daily.   ondansetron (ZOFRAN) 4 MG tablet Take 1 tablet (4 mg total) by mouth every 6 (six) hours.   polyethylene glycol powder (GLYCOLAX/MIRALAX) 17 GM/SCOOP powder Take 17 g by mouth daily as needed for mild constipation.   potassium chloride SA (KLOR-CON M) 20 MEQ  tablet Take 1 tablet (20 mEq total) by mouth daily.     Allergies:   Ketek [telithromycin], Toprol xl [metoprolol], Cephalosporins, Codeine, Durezol [difluprednate], Flagyl [metronidazole], Lamictal [lamotrigine], Levaquin [levofloxacin], Maxzide [hydrochlorothiazide w-triamterene], Mobic [meloxicam], Motrin [ibuprofen], Nsaids, Prednisone, Rosuvastatin, and Vistaril [hydroxyzine]   Social History   Socioeconomic History   Marital status: Legally Separated    Spouse name: Not on file   Number of children: 3   Years of education: 14   Highest education level: Not on file  Occupational History   Occupation: Retired  Tobacco Use   Smoking status: Former    Packs/day: 0.50    Years: 20.00    Total pack years: 10.00    Types: Cigarettes    Quit date: 10/02/1992    Years since quitting: 29.3   Smokeless tobacco: Never  Vaping Use   Vaping Use: Never used  Substance and Sexual Activity   Alcohol use: No   Drug use: No   Sexual activity: Not on file  Other Topics Concern   Not on file  Social History Narrative   Lives alone   Caffeine use: Drinks water and coffee daily   Soda rare   Right handed    Social Determinants of Health   Financial Resource Strain: Not on file  Food Insecurity: No Food Insecurity (10/11/2021)   Hunger Vital Sign    Worried About Running Out of Food in the Last Year: Never true    Ran Out of Food in the Last Year: Never true  Transportation Needs: No Transportation Needs (10/11/2021)   PRAPARE - Hydrologist (Medical): No    Lack of Transportation (Non-Medical): No  Physical Activity: Not on file  Stress: Not on file  Social Connections: Not on file     Family History: The patient's family history includes Allergic rhinitis in her daughter, daughter, grandson, and son; Alzheimer's disease in her mother; Asthma in her grandson; Diabetes in her brother and sister; Eczema in her grandson; Food Allergy in her granddaughter;  Heart disease in her mother; Liver disease in her brother; Prostate cancer in her father. There is no history of Immunodeficiency, Urticaria, Angioedema, or Atopy.  ROS:   Please see the history of present illness.    Multiple internal medicine complaints all other systems reviewed and are negative.  EKGs/Labs/Other Studies Reviewed:    The following studies were reviewed today: 2D Doppler echocardiogram September 2023: MPRESSIONS     1. Left ventricular ejection fraction, by estimation, is 70 to 75%. Left  ventricular ejection fraction by 2D MOD biplane is 73.2 %. The left  ventricle has hyperdynamic function. The left ventricle has no regional  wall motion abnormalities. There is mild   left ventricular hypertrophy. Left ventricular diastolic parameters are  consistent with Grade I diastolic dysfunction (impaired relaxation).  2. Right ventricular systolic function is normal. The right ventricular  size is normal. There is normal pulmonary artery systolic pressure. The  estimated right ventricular systolic pressure is 123456 mmHg.   3. The mitral valve was not well visualized. No evidence of mitral valve  regurgitation.   4. The aortic valve is tricuspid. Aortic valve regurgitation is not  visualized.   5. The inferior vena cava is normal in size with greater than 50%  respiratory variability, suggesting right atrial pressure of 3 mmHg.   Comparison(s): Changes from prior study are noted. 08/31/2019: LVEF 60-6   EKG:  EKG not repeated  Recent Labs: 10/10/2021: ALT 12 10/16/2021: TSH 1.250 12/21/2021: BUN 18; Creatinine, Ser 1.07; Hemoglobin 14.2; Magnesium 2.0; Platelets 321; Potassium 3.6; Sodium 137  Recent Lipid Panel    Component Value Date/Time   CHOL 191 10/11/2021 0526   CHOL 165 11/12/2019 1007   TRIG 79 10/11/2021 0526   HDL 61 10/11/2021 0526   HDL 72 11/12/2019 1007   CHOLHDL 3.1 10/11/2021 0526   VLDL 16 10/11/2021 0526   LDLCALC 114 (H) 10/11/2021 0526    LDLCALC 77 11/12/2019 1007    Physical Exam:    VS:  BP 138/76   Pulse 63   Ht 5\' 3"  (1.6 m)   Wt 218 lb (98.9 kg)   SpO2 95%   BMI 38.62 kg/m     Wt Readings from Last 3 Encounters:  01/15/22 218 lb (98.9 kg)  01/01/22 220 lb (99.8 kg)  12/21/21 270 lb (122.5 kg)     GEN: Overweight. No acute distress HEENT: Normal NECK: No JVD. LYMPHATICS: No lymphadenopathy CARDIAC: No murmur. RRR no gallop, or edema. VASCULAR:  Normal Pulses. No bruits. RESPIRATORY:  Clear to auscultation without rales, wheezing or rhonchi  ABDOMEN: Soft, non-tender, non-distended, No pulsatile mass, MUSCULOSKELETAL: No deformity  SKIN: Warm and dry NEUROLOGIC:  Alert and oriented x 3 PSYCHIATRIC:  Normal affect   ASSESSMENT:    1. Coronary artery calcification seen on CT scan   2. Hyperlipidemia, unspecified hyperlipidemia type   3. Hypertension, unspecified type   4. OSA (obstructive sleep apnea)   5. Carotid artery disease, unspecified laterality, unspecified type ([place])   6. TIA (transient ischemic attack)    PLAN:    In order of problems listed above:  Leads risk reduction with treating lipids if possible.  She is ambivalent about therapy.  She has not been able to tolerate statins.  A lipid panel today with the patient on diet and values will be used in the lipid clinic.  Target LDL less than 70. See 1 above Continue current therapy as blood pressure is reasonably controlled. CPAP use is encouraged. Secondary prevention Secondary prevention   Clinical follow-up in 1 year.  [place] lipid clinic with LDL target less than 70 given elevated coronary calcium score and recent data that lipid-lowering in octogenarians decreases the relative risk of cardiovascular events by greater than 30%   Medication Adjustments/Labs and Tests Ordered: Current medicines are reviewed at length with the patient today.  Concerns regarding medicines are outlined above.  Orders Placed This Encounter   Procedures   AMB Referral to [facility]   No orders of the defined types were placed in this encounter.   Patient Instructions  Medication Instructions:  Your physician recommends that you continue on your current medications as directed. Please refer to the Current Medication list given to you today.  *If you need a refill on your cardiac  medications before your next appointment, please call your pharmacy*  Follow-Up: At [facility], you and your health needs are our priority.  As part of our continuing mission to provide you with exceptional heart care, we have created designated Provider Care Teams.  These Care Teams include your primary Cardiologist (physician) and Advanced Practice Providers (APPs -  Physician Assistants and Nurse Practitioners) who all work together to provide you with the care you need, when you need it.  Your next appointment:   1 year(s)  The format for your next appointment:   In Person  Provider:   Donato Schultz, MD or Riley Lam, MD or Laurance Flatten, MD  Other Instructions Your physician has referred you to our Lipid Clinic here at our [facility] office. You will meet with our pharmacist to discuss treatment options for cholesterol management.  Important Information About Sugar         Signed, Lesleigh Noe, MD  01/15/2022 2:46 PM    [facility]

## 2022-01-15 ENCOUNTER — Ambulatory Visit: Payer: Medicare HMO

## 2022-01-15 ENCOUNTER — Encounter: Payer: Self-pay | Admitting: Interventional Cardiology

## 2022-01-15 ENCOUNTER — Ambulatory Visit: Payer: Medicare HMO | Attending: Interventional Cardiology | Admitting: Interventional Cardiology

## 2022-01-15 VITALS — BP 138/76 | HR 63 | Ht 63.0 in | Wt 218.0 lb

## 2022-01-15 DIAGNOSIS — I10 Essential (primary) hypertension: Secondary | ICD-10-CM | POA: Diagnosis not present

## 2022-01-15 DIAGNOSIS — G4733 Obstructive sleep apnea (adult) (pediatric): Secondary | ICD-10-CM | POA: Diagnosis not present

## 2022-01-15 DIAGNOSIS — I779 Disorder of arteries and arterioles, unspecified: Secondary | ICD-10-CM

## 2022-01-15 DIAGNOSIS — G459 Transient cerebral ischemic attack, unspecified: Secondary | ICD-10-CM

## 2022-01-15 DIAGNOSIS — I2510 Atherosclerotic heart disease of native coronary artery without angina pectoris: Secondary | ICD-10-CM | POA: Diagnosis not present

## 2022-01-15 DIAGNOSIS — E785 Hyperlipidemia, unspecified: Secondary | ICD-10-CM

## 2022-01-15 NOTE — Patient Instructions (Addendum)
Medication Instructions:  Your physician recommends that you continue on your current medications as directed. Please refer to the Current Medication list given to you today.  *If you need a refill on your cardiac medications before your next appointment, please call your pharmacy*  Follow-Up: At [facility], you and your health needs are our priority.  As part of our continuing mission to provide you with exceptional heart care, we have created designated Provider Care Teams.  These Care Teams include your primary Cardiologist (physician) and Advanced Practice Providers (APPs -  Physician Assistants and Nurse Practitioners) who all work together to provide you with the care you need, when you need it.  Your next appointment:   1 year(s)  The format for your next appointment:   In Person  Provider:   Donato Schultz, MD or Riley Lam, MD or Laurance Flatten, MD  Other Instructions Your physician has referred you to our Lipid Clinic here at our [facility] office. You will meet with our pharmacist to discuss treatment options for cholesterol management.  Important Information About Sugar

## 2022-01-16 LAB — HEPATIC FUNCTION PANEL
ALT: 11 IU/L (ref 0–32)
AST: 17 IU/L (ref 0–40)
Albumin: 4.9 g/dL — ABNORMAL HIGH (ref 3.8–4.8)
Alkaline Phosphatase: 110 IU/L (ref 44–121)
Bilirubin Total: 0.3 mg/dL (ref 0.0–1.2)
Bilirubin, Direct: <0.1 mg/dL (ref 0.00–0.40)
Total Protein: 7.5 g/dL (ref 6.0–8.5)

## 2022-01-16 LAB — LIPID PANEL
Chol/HDL Ratio: 3.4 ratio (ref 0.0–4.4)
Cholesterol, Total: 239 mg/dL — ABNORMAL HIGH (ref 100–199)
HDL: 71 mg/dL (ref >39)
LDL Chol Calc (NIH): 146 mg/dL — ABNORMAL HIGH (ref 0–99)
Triglycerides: 126 mg/dL (ref 0–149)
VLDL Cholesterol Cal: 22 mg/dL (ref 5–40)

## 2022-01-31 ENCOUNTER — Telehealth: Payer: Self-pay | Admitting: Interventional Cardiology

## 2022-01-31 MED ORDER — ATENOLOL 50 MG PO TABS: ORAL_TABLET | ORAL | 3 refills | Status: AC

## 2022-01-31 NOTE — Telephone Encounter (Signed)
*  STAT* If patient is at the pharmacy, call can be transferred to refill team.   1. Which medications need to be refilled? (please list name of each medication and dose if known) atenolol (TENORMIN) 50 MG tablet   2. Which pharmacy/location (including street and city if local pharmacy) is medication to be sent to? [id number] - [place], [place] - [address] AT [address]   3. Do they need a 30 day or 90 day supply? 90 day supply    Patient runs out of medication this week.

## 2022-01-31 NOTE — Telephone Encounter (Signed)
Pt's medication was sent to pt's pharmacy as requested. Confirmation received.  °

## 2022-02-07 ENCOUNTER — Telehealth: Payer: Self-pay | Admitting: Nurse Practitioner

## 2022-02-07 DIAGNOSIS — Z789 Other specified health status: Secondary | ICD-10-CM

## 2022-02-07 DIAGNOSIS — E782 Mixed hyperlipidemia: Secondary | ICD-10-CM

## 2022-02-07 NOTE — Telephone Encounter (Signed)
Called and spoke with patient. She states that she has taken Zetia in the past prescribed by Dr Inda Merlin and she had a "terrible experience" with it. She does not wish to be started on this, but does agree to come in and see lipid clinic. I do not see an active referral for this, so placed at this time. Understands she will be called to schedule.

## 2022-02-07 NOTE — Telephone Encounter (Signed)
-----   Message from Merlene Laughter, RN sent at 02/06/2022 11:10 AM EST -----  ----- Message ----- From: Finis Bud, NP Sent: 02/06/2022  10:24 AM EST To: Merlene Laughter, RN  I last saw this patient on 09/2021. Hx of statin intolerance. Please initiate Zetia 10 mg daily and repeat FLP and LFT in 2 months. Was referred to [facility] by Dr. Tamala Julian. Please make sure she follows with them. She will see Cardiologist back in 1 year.   Thanks!   Best, Finis Bud, AGNP-C

## 2022-02-08 DIAGNOSIS — N1832 Chronic kidney disease, stage 3b: Secondary | ICD-10-CM | POA: Insufficient documentation

## 2022-02-20 ENCOUNTER — Ambulatory Visit: Payer: Medicare HMO | Admitting: Adult Health

## 2022-03-01 ENCOUNTER — Telehealth: Payer: Self-pay | Admitting: Neurology

## 2022-03-01 ENCOUNTER — Telehealth: Payer: Self-pay | Admitting: Adult Health

## 2022-03-01 NOTE — Telephone Encounter (Signed)
Pt is calling. Requesting a referral to [facility]. Stated she is not happy with her provider.

## 2022-03-04 NOTE — Telephone Encounter (Signed)
Called and left detailed vm ok per DPR informing the pt that she would have to request referral from PCP. Left office number for her to call back if she had any questions.

## 2022-03-10 ENCOUNTER — Emergency Department (HOSPITAL_BASED_OUTPATIENT_CLINIC_OR_DEPARTMENT_OTHER): Payer: Medicare HMO

## 2022-03-10 ENCOUNTER — Other Ambulatory Visit: Payer: Self-pay

## 2022-03-10 ENCOUNTER — Emergency Department (HOSPITAL_BASED_OUTPATIENT_CLINIC_OR_DEPARTMENT_OTHER)
Admission: EM | Admit: 2022-03-10 | Discharge: 2022-03-10 | Disposition: A | Discharge status: Home / Self Care | Payer: Medicare HMO | Attending: Emergency Medicine | Admitting: Emergency Medicine

## 2022-03-10 DIAGNOSIS — I10 Essential (primary) hypertension: Secondary | ICD-10-CM | POA: Diagnosis not present

## 2022-03-10 DIAGNOSIS — I159 Secondary hypertension, unspecified: Secondary | ICD-10-CM

## 2022-03-10 DIAGNOSIS — Z7982 Long term (current) use of aspirin: Secondary | ICD-10-CM | POA: Insufficient documentation

## 2022-03-10 DIAGNOSIS — H538 Other visual disturbances: Secondary | ICD-10-CM | POA: Diagnosis not present

## 2022-03-10 DIAGNOSIS — Z79899 Other long term (current) drug therapy: Secondary | ICD-10-CM | POA: Diagnosis not present

## 2022-03-10 DIAGNOSIS — R519 Headache, unspecified: Secondary | ICD-10-CM | POA: Insufficient documentation

## 2022-03-10 LAB — CBC WITH DIFFERENTIAL/PLATELET
Abs Immature Granulocytes: 0.04 10*3/uL (ref 0.00–0.07)
Basophils Absolute: 0 10*3/uL (ref 0.0–0.1)
Basophils Relative: 0 %
Eosinophils Absolute: 0 10*3/uL (ref 0.0–0.5)
Eosinophils Relative: 0 %
HCT: 44.7 % (ref 36.0–46.0)
Hemoglobin: 14.6 g/dL (ref 12.0–15.0)
Immature Granulocytes: 0 %
Lymphocytes Relative: 25 %
Lymphs Abs: 2.6 10*3/uL (ref 0.7–4.0)
MCH: 29.1 pg (ref 26.0–34.0)
MCHC: 32.7 g/dL (ref 30.0–36.0)
MCV: 89.2 fL (ref 80.0–100.0)
Monocytes Absolute: 0.4 10*3/uL (ref 0.1–1.0)
Monocytes Relative: 4 %
Neutro Abs: 7.5 10*3/uL (ref 1.7–7.7)
Neutrophils Relative %: 71 %
Platelets: 380 10*3/uL (ref 150–400)
RBC: 5.01 MIL/uL (ref 3.87–5.11)
RDW: 12.1 % (ref 11.5–15.5)
WBC: 10.7 10*3/uL — ABNORMAL HIGH (ref 4.0–10.5)
nRBC: 0 % (ref 0.0–0.2)

## 2022-03-10 LAB — BASIC METABOLIC PANEL
Anion gap: 7 (ref 5–15)
BUN: 13 mg/dL (ref 8–23)
CO2: 28 mmol/L (ref 22–32)
Calcium: 9.2 mg/dL (ref 8.9–10.3)
Chloride: 95 mmol/L — ABNORMAL LOW (ref 98–111)
Creatinine, Ser: 0.95 mg/dL (ref 0.44–1.00)
GFR, Estimated: >60 mL/min (ref >60)
Glucose, Bld: 100 mg/dL — ABNORMAL HIGH (ref 70–99)
Potassium: 3.9 mmol/L (ref 3.5–5.1)
Sodium: 130 mmol/L — ABNORMAL LOW (ref 135–145)

## 2022-03-10 LAB — CBG MONITORING, ED: Glucose-Capillary: 100 mg/dL — ABNORMAL HIGH (ref 70–99)

## 2022-03-10 MED ORDER — SODIUM CHLORIDE 0.9 % IV BOLUS
500.0000 mL | Freq: Once | INTRAVENOUS | Status: AC
  Administered 2022-03-10: 500 mL via INTRAVENOUS

## 2022-03-10 MED ORDER — ACETAMINOPHEN 500 MG PO TABS
1000.0000 mg | ORAL_TABLET | Freq: Once | ORAL | Status: AC
  Administered 2022-03-10: 1000 mg via ORAL
  Filled 2022-03-10: qty 2

## 2022-03-10 MED ORDER — HYDRALAZINE HCL 20 MG/ML IJ SOLN
5.0000 mg | Freq: Once | INTRAMUSCULAR | Status: DC
  Filled 2022-03-10: qty 1

## 2022-03-10 NOTE — ED Provider Notes (Signed)
[facility] Provider Note   CSN: [id number] Arrival date & time: 03/10/22  1647     History {Add pertinent medical, surgical, social history, OB history to HPI:1} Chief Complaint  Patient presents with   Headache    Marissa Dodson is a 82 y.o. female.  HPI   82 year old female with past medical history of HTN presents emergency department with concern for high blood pressure, headache and blurred vision.  Patient states that her headache began earlier today around 10 in the morning.  It was gradual, located in the frontal area.  No associated neck pain.  Later in the morning she developed intermittent blurred vision but no vision loss, facial droop, speech difficulty or focal weakness/numbness.  Currently her vision is baseline.  States has been compliant with her medications, no recent illness.  Home Medications Prior to Admission medications   Medication Sig Start Date End Date Taking? Authorizing Provider  acetaminophen (TYLENOL) 500 MG tablet Take 500 mg by mouth daily as needed for headache.    [provider]  albuterol (VENTOLIN HFA) 108 (90 Base) MCG/ACT inhaler Inhale 2 puffs into the lungs every 6 (six) hours as needed for wheezing or shortness of breath.    [provider]  amitriptyline (ELAVIL) 50 MG tablet Take 50 mg by mouth at bedtime.    [provider]  aspirin EC 81 MG tablet Take 1 tablet (81 mg total) by mouth daily. 10/13/21   Oswald Hillock, MD  atenolol (TENORMIN) 50 MG tablet Take 1 tablet by mouth daily, may take extra 1/2 dose daily as needed for systolic bp over [id number]   Belva Crome, MD  Carboxymethylcellul-Glycerin (LUBRICATING EYE DROPS OP) Place 1 drop into both eyes daily as needed (dry eyes).    [provider]  cetirizine (ZYRTEC) 10 MG tablet Take 10 mg by mouth daily.    [provider]  citalopram (CELEXA) 10 MG tablet Take 1 tablet (10 mg total) by  mouth daily. 01/01/22   Marcial Pacas, MD  furosemide (LASIX) 40 MG tablet Take 20 mg by mouth See admin instructions. 1/2 tablet (20 mg) every morning, takes the other 1/2 tablet (20 mg) later in the day if needed for swelling    [provider]  meclizine (ANTIVERT) 25 MG tablet Take 1 tablet (25 mg total) by mouth 3 (three) times daily as needed for dizziness. 12/21/21   Leanord Asal K, DO  omeprazole (PRILOSEC) 20 MG capsule Take 20 mg by mouth daily. 07/17/16   [provider]  ondansetron (ZOFRAN) 4 MG tablet Take 1 tablet (4 mg total) by mouth every 6 (six) hours. 12/21/21   Leanord Asal K, DO  polyethylene glycol powder (GLYCOLAX/MIRALAX) 17 GM/SCOOP powder Take 17 g by mouth daily as needed for mild constipation. 08/30/20   [provider]  potassium chloride SA (KLOR-CON M) 20 MEQ tablet Take 1 tablet (20 mEq total) by mouth daily. 12/09/21   Eileen Stanford, PA-C      Allergies    Ketek [telithromycin], Toprol xl [metoprolol], Cephalosporins, Codeine, Durezol [difluprednate], Flagyl [metronidazole], Lamictal [lamotrigine], Levaquin [levofloxacin], Maxzide [hydrochlorothiazide w-triamterene], Mobic [meloxicam], Motrin [ibuprofen], Nsaids, Prednisone, Rosuvastatin, and Vistaril [hydroxyzine]    Review of Systems   Review of Systems  Constitutional:  Positive for fatigue. Negative for fever.  Eyes:  Positive for visual disturbance. Negative for pain and redness.  Respiratory:  Negative for shortness of breath.   Cardiovascular:  Negative for chest pain.  Gastrointestinal:  Negative for abdominal pain, diarrhea and vomiting.  Musculoskeletal:  Negative for neck stiffness.  Skin:  Negative for rash.  Neurological:  Positive for headaches. Negative for dizziness, syncope, speech difficulty, weakness and light-headedness.    Physical Exam Updated Vital Signs BP (!) 130/57   Pulse (!) 59   Temp 98.3 F (36.8 C) (Oral)   Resp 16   SpO2 96%   Physical Exam Vitals and nursing note reviewed.  Constitutional:      Appearance: Normal appearance.  HENT:     Head: Normocephalic.     Mouth/Throat:     Mouth: Mucous membranes are moist.  Eyes:     General: No visual field deficit.    Extraocular Movements: Extraocular movements intact.     Pupils: Pupils are equal, round, and reactive to light.  Cardiovascular:     Rate and Rhythm: Normal rate.  Pulmonary:     Effort: Pulmonary effort is normal. No respiratory distress.  Abdominal:     Palpations: Abdomen is soft.     Tenderness: There is no abdominal tenderness.  Musculoskeletal:     Cervical back: Normal range of motion.  Skin:    General: Skin is warm.  Neurological:     Mental Status: She is alert and oriented to person, place, and time. Mental status is at baseline.     Cranial Nerves: No cranial nerve deficit or facial asymmetry.  Psychiatric:        Mood and Affect: Mood normal.     ED Results / Procedures / Treatments   Labs (all labs ordered are listed, but only abnormal results are displayed) Labs Reviewed  CBC WITH DIFFERENTIAL/PLATELET - Abnormal; Notable for the following components:      Result Value   WBC 10.7 (*)    All other components within normal limits  BASIC METABOLIC PANEL - Abnormal; Notable for the following components:   Sodium 130 (*)    Chloride 95 (*)    Glucose, Bld 100 (*)    All other components within normal limits  CBG MONITORING, ED - Abnormal; Notable for the following components:   Glucose-Capillary 100 (*)    All other components within normal limits    EKG None  Radiology CT Head Wo Contrast  Result Date: 03/10/2022 CLINICAL DATA:  Sudden onset of severe headache. EXAM: CT HEAD WITHOUT CONTRAST TECHNIQUE: Contiguous axial images were obtained from the base of the skull through the vertex without intravenous contrast. RADIATION DOSE REDUCTION: This exam was performed according to the departmental dose-optimization  program which includes automated exposure control, adjustment of the mA and/or kV according to patient size and/or use of iterative reconstruction technique. COMPARISON:  12/21/2021 FINDINGS: Brain: No evidence of intracranial hemorrhage, acute infarction, hydrocephalus, extra-axial collection, or mass lesion/mass effect. Vascular:  No hyperdense vessel or other acute findings. Skull: No evidence of fracture or other significant bone abnormality. Sinuses/Orbits:  No acute findings. Other: None. IMPRESSION: Negative noncontrast head CT. Electronically Signed   By: Marlaine Hind M.D.   On: 03/10/2022 17:47    Procedures Procedures  {Document cardiac monitor, telemetry assessment procedure when appropriate:1}  Medications Ordered in ED Medications  sodium chloride 0.9 % bolus 500 mL (500 mLs Intravenous New Bag/Given 03/10/22 1909)  acetaminophen (TYLENOL) tablet 1,000 mg (1,000 mg Oral Given 03/10/22 1909)    ED Course/ Medical Decision Making/ A&P   {   Click here for ABCD2, HEART and other calculatorsREFRESH Note before  signing :1}                          Medical Decision Making Amount and/or Complexity of Data Reviewed Labs: ordered. Radiology: ordered.  Risk OTC drugs.   82 year old female presents emergency department with gradual frontal headache along with HTN and intermittent blurred vision.  Currently vision is baseline.  Patient is hypertensive on arrival.  Denies any neck pain, chest pain, difficulty breathing.  She is nonfocal on her neuroexam.  CT of the head is unremarkable.  Blood work is reassuring besides mild hyponatremia.  Blood pressure downtrending without any medical intervention.  After Tylenol and fluids headache resolved.  Patient is in the process of establishing outpatient primary care.  Gave her further recommendations and she has no other complaints at this time.  Patient at this time appears safe and stable for discharge and close outpatient follow up.  Discharge plan and strict return to ED precautions discussed, patient verbalizes understanding and agreement.  {Document critical care time when appropriate:1} {Document review of labs and clinical decision tools ie heart score, Chads2Vasc2 etc:1}  {Document your independent review of radiology images, and any outside records:1} {Document your discussion with family members, caretakers, and with consultants:1} {Document social determinants of health affecting pt's care:1} {Document your decision making why or why not admission, treatments were needed:1} Final Clinical Impression(s) / ED Diagnoses Final diagnoses:  None    Rx / DC Orders ED Discharge Orders     None

## 2022-03-10 NOTE — Discharge Instructions (Signed)
You have been seen and discharged from the emergency department.  You are found to have high blood pressure and headache.  CT of your head was normal.  Your blood work showed slightly low sodium but was baseline for you.  Your blood pressure down trended on its own.  You were given IV fluids and other medicine.  Establish care and follow-up with the primary doctor.   Take home medications as prescribed. If you have any worsening symptoms or further concerns for your health please return to an emergency department for further evaluation.

## 2022-03-10 NOTE — ED Triage Notes (Signed)
Pt c/o headache that began at 1000. Pt states she got diaphoretic with the headache. States she got blurry vision and light headed. Pt states her BP was running high as well.

## 2022-03-26 ENCOUNTER — Telehealth: Payer: Self-pay

## 2022-03-26 NOTE — Telephone Encounter (Signed)
POC faxed to OP rehab

## 2022-04-09 DIAGNOSIS — R0600 Dyspnea, unspecified: Secondary | ICD-10-CM | POA: Insufficient documentation

## 2022-04-09 DIAGNOSIS — Z780 Asymptomatic menopausal state: Secondary | ICD-10-CM | POA: Insufficient documentation

## 2022-06-20 ENCOUNTER — Other Ambulatory Visit: Payer: Self-pay

## 2022-06-20 ENCOUNTER — Emergency Department (HOSPITAL_COMMUNITY)
Admission: EM | Admit: 2022-06-20 | Discharge: 2022-06-20 | Disposition: A | Discharge status: Home / Self Care | Payer: Medicare HMO | Attending: Emergency Medicine | Admitting: Emergency Medicine

## 2022-06-20 ENCOUNTER — Emergency Department (HOSPITAL_COMMUNITY): Payer: Medicare HMO

## 2022-06-20 DIAGNOSIS — R519 Headache, unspecified: Secondary | ICD-10-CM | POA: Diagnosis not present

## 2022-06-20 DIAGNOSIS — Z7982 Long term (current) use of aspirin: Secondary | ICD-10-CM | POA: Insufficient documentation

## 2022-06-20 DIAGNOSIS — R29898 Other symptoms and signs involving the musculoskeletal system: Secondary | ICD-10-CM

## 2022-06-20 DIAGNOSIS — R0602 Shortness of breath: Secondary | ICD-10-CM | POA: Insufficient documentation

## 2022-06-20 DIAGNOSIS — R531 Weakness: Secondary | ICD-10-CM | POA: Insufficient documentation

## 2022-06-20 LAB — HEPATIC FUNCTION PANEL
ALT: 13 U/L (ref 0–44)
AST: 15 U/L (ref 15–41)
Albumin: 3.5 g/dL (ref 3.5–5.0)
Alkaline Phosphatase: 67 U/L (ref 38–126)
Bilirubin, Direct: <0.1 mg/dL (ref 0.0–0.2)
Total Bilirubin: 0.7 mg/dL (ref 0.3–1.2)
Total Protein: 6.4 g/dL — ABNORMAL LOW (ref 6.5–8.1)

## 2022-06-20 LAB — BASIC METABOLIC PANEL
Anion gap: 10 (ref 5–15)
BUN: 15 mg/dL (ref 8–23)
CO2: 27 mmol/L (ref 22–32)
Calcium: 8.6 mg/dL — ABNORMAL LOW (ref 8.9–10.3)
Chloride: 95 mmol/L — ABNORMAL LOW (ref 98–111)
Creatinine, Ser: 0.95 mg/dL (ref 0.44–1.00)
GFR, Estimated: >60 mL/min (ref >60)
Glucose, Bld: 92 mg/dL (ref 70–99)
Potassium: 4 mmol/L (ref 3.5–5.1)
Sodium: 132 mmol/L — ABNORMAL LOW (ref 135–145)

## 2022-06-20 LAB — CBC
HCT: 38.8 % (ref 36.0–46.0)
Hemoglobin: 12.6 g/dL (ref 12.0–15.0)
MCH: 28.5 pg (ref 26.0–34.0)
MCHC: 32.5 g/dL (ref 30.0–36.0)
MCV: 87.8 fL (ref 80.0–100.0)
Platelets: 292 10*3/uL (ref 150–400)
RBC: 4.42 MIL/uL (ref 3.87–5.11)
RDW: 12.8 % (ref 11.5–15.5)
WBC: 9.6 10*3/uL (ref 4.0–10.5)
nRBC: 0 % (ref 0.0–0.2)

## 2022-06-20 LAB — URINALYSIS, ROUTINE W REFLEX MICROSCOPIC
Bacteria, UA: NONE SEEN
Bilirubin Urine: NEGATIVE
Glucose, UA: NEGATIVE mg/dL
Hgb urine dipstick: NEGATIVE
Ketones, ur: NEGATIVE mg/dL
Leukocytes,Ua: NEGATIVE
Nitrite: NEGATIVE
Protein, ur: NEGATIVE mg/dL
Specific Gravity, Urine: 1.002 — ABNORMAL LOW (ref 1.005–1.030)
pH: 7 (ref 5.0–8.0)

## 2022-06-20 LAB — TROPONIN I (HIGH SENSITIVITY)
Troponin I (High Sensitivity): 4 ng/L (ref <18)
Troponin I (High Sensitivity): 6 ng/L (ref <18)

## 2022-06-20 MED ORDER — LORAZEPAM 2 MG/ML IJ SOLN
0.5000 mg | Freq: Once | INTRAMUSCULAR | Status: AC
  Administered 2022-06-20: 0.5 mg via INTRAVENOUS
  Filled 2022-06-20: qty 1

## 2022-06-20 NOTE — Discharge Instructions (Signed)
1.  Try to rest as needed but get some light exercise and activity.  If you do feel lightheaded or weak sit down immediately and elevate your legs. 2.  Try to eat small and frequent nutritious meals. 3.  Follow-up with your doctor for recheck.  Return to the emergency department if you are getting new worsening or concerning symptoms. 4.  Your MRI did not show evidence of a stroke.  Your EKG and labs did not show evidence of a heart attack.

## 2022-06-20 NOTE — ED Provider Notes (Signed)
[facility] EMERGENCY [facility] AT [facility] Provider Note   CSN: [id number] Arrival date & time: 06/20/22  1655     History  Chief Complaint  Patient presents with   Hypertension    Marissa Dodson is a 82 y.o. female.  HPI Patient reports for over 3 days, almost up to a week, she is feeling weak and not very well.  She does report that 3 days ago she had an onset of weakness in her arms.  She reports it was more pronounced in the right and she felt like she has some difficulty using the right arm.  Since that time both arms feel weaker than normal but the right still slightly more than left.  She reports at baseline she does ambulate independently.  She has been able to get up and move around but has felt more weak and unsteady.  She reports she started to get a headache today and took her blood pressure which was elevated at 170/90.  She took an additional half dose of her blood pressure medication and it started to come down.  She has not had any loss of vision.  No fever no nausea no vomiting.  She reports she does get some shortness of breath with lying flat and exertion.  No active chest pain.  Although she does qualify that when she had the headache it seemed to radiate somewhat down her neck towards her shoulder on the left.    Home Medications Prior to Admission medications   Medication Sig Start Date End Date Taking? Authorizing Provider  acetaminophen (TYLENOL) 500 MG tablet Take 500 mg by mouth daily as needed for headache.    [provider]  albuterol (VENTOLIN HFA) 108 (90 Base) MCG/ACT inhaler Inhale 2 puffs into the lungs every 6 (six) hours as needed for wheezing or shortness of breath.    [provider]  amitriptyline (ELAVIL) 50 MG tablet Take 50 mg by mouth at bedtime.    [provider]  aspirin EC 81 MG tablet Take 1 tablet (81 mg total) by mouth daily. 10/13/21   Meredeth Ide, MD  atenolol (TENORMIN) 50 MG tablet Take 1  tablet by mouth daily, may take extra 1/2 dose daily as needed for systolic bp over 109 01/31/22   Lyn Records, MD  Carboxymethylcellul-Glycerin (LUBRICATING EYE DROPS OP) Place 1 drop into both eyes daily as needed (dry eyes).    [provider]  cetirizine (ZYRTEC) 10 MG tablet Take 10 mg by mouth daily.    [provider]  citalopram (CELEXA) 10 MG tablet Take 1 tablet (10 mg total) by mouth daily. 01/01/22   Levert Feinstein, MD  furosemide (LASIX) 40 MG tablet Take 20 mg by mouth See admin instructions. 1/2 tablet (20 mg) every morning, takes the other 1/2 tablet (20 mg) later in the day if needed for swelling    [provider]  meclizine (ANTIVERT) 25 MG tablet Take 1 tablet (25 mg total) by mouth 3 (three) times daily as needed for dizziness. 12/21/21   Elayne Snare K, DO  omeprazole (PRILOSEC) 20 MG capsule Take 20 mg by mouth daily. 07/17/16   [provider]  ondansetron (ZOFRAN) 4 MG tablet Take 1 tablet (4 mg total) by mouth every 6 (six) hours. 12/21/21   Elayne Snare K, DO  polyethylene glycol powder (GLYCOLAX/MIRALAX) 17 GM/SCOOP powder Take 17 g by mouth daily as needed for mild constipation. 08/30/20   [provider]  potassium chloride SA (KLOR-CON M) 20 MEQ tablet Take 1 tablet (20 mEq total) by mouth daily. 12/09/21   Janetta Hora, PA-C      Allergies    Ketek [telithromycin], Toprol xl [metoprolol], Cephalosporins, Codeine, Durezol [difluprednate], Flagyl [metronidazole], Lamictal [lamotrigine], Levaquin [levofloxacin], Maxzide [hydrochlorothiazide w-triamterene], Mobic [meloxicam], Motrin [ibuprofen], Nsaids, Prednisone, Rosuvastatin, and Vistaril [hydroxyzine]    Review of Systems   Review of Systems  Physical Exam Updated Vital Signs BP (!) 147/73   Pulse (!) 55   Temp 98.6 F (37 C) (Oral)   Resp 17   Ht 5\' 3"  (1.6 m)   Wt 98.9 kg   SpO2 99%   BMI 38.62 kg/m  Physical Exam Constitutional:      Comments:  Alert nontoxic no respiratory distress at rest.  HENT:     Head: Normocephalic and atraumatic.     Mouth/Throat:     Mouth: Mucous membranes are moist.     Pharynx: Oropharynx is clear.  Eyes:     Extraocular Movements: Extraocular movements intact.     Pupils: Pupils are equal, round, and reactive to light.  Cardiovascular:     Rate and Rhythm: Normal rate and regular rhythm.  Pulmonary:     Effort: Pulmonary effort is normal.     Breath sounds: Normal breath sounds.  Abdominal:     General: There is no distension.     Palpations: Abdomen is soft.     Tenderness: There is no abdominal tenderness. There is no guarding.  Musculoskeletal:        General: No swelling or tenderness. Normal range of motion.     Cervical back: Neck supple.     Right lower leg: No edema.     Left lower leg: No edema.  Skin:    General: Skin is warm and dry.  Neurological:     General: No focal deficit present.     Mental Status: She is oriented to person, place, and time.     Comments: No focal weakness.  Patient is alert and oriented.  No cranial nerve asymmetry.  Grip strength bilateral 5\5.  No pronator drift.  Patient does struggle somewhat to elevate each leg off the bed independently and hold however she does have some body habitus and deconditioning.  She does have symmetric plantarflexion against resistance.     ED Results / Procedures / Treatments   Labs (all labs ordered are listed, but only abnormal results are displayed) Labs Reviewed  BASIC METABOLIC PANEL - Abnormal; Notable for the following components:      Result Value   Sodium 132 (*)    Chloride 95 (*)    Calcium 8.6 (*)    All other components within normal limits  HEPATIC FUNCTION PANEL - Abnormal; Notable for the following components:   Total Protein 6.4 (*)    All other components within normal limits  URINALYSIS, ROUTINE W REFLEX MICROSCOPIC - Abnormal; Notable for the following components:   Color, Urine COLORLESS (*)     Specific Gravity, Urine 1.002 (*)    All other components within normal limits  CBC  TROPONIN I (HIGH SENSITIVITY)  TROPONIN I (HIGH SENSITIVITY)    EKG None  Radiology MR BRAIN WO CONTRAST  Result Date: 06/20/2022 CLINICAL DATA:  Acute neurologic deficit EXAM: MRI HEAD WITHOUT CONTRAST TECHNIQUE: Multiplanar, multiecho pulse sequences of the brain and surrounding structures were obtained without intravenous contrast. COMPARISON:  None Available. FINDINGS: Brain: No acute infarct, mass effect or extra-axial  collection. No acute or chronic hemorrhage. There is multifocal hyperintense T2-weighted signal within the white matter. Parenchymal volume and CSF spaces are normal. The midline structures are normal. Vascular: Major flow voids are preserved. Skull and upper cervical spine: Normal calvarium and skull base. Visualized upper cervical spine and soft tissues are normal. Sinuses/Orbits:No paranasal sinus fluid levels or advanced mucosal thickening. No mastoid or middle ear effusion. Normal orbits. IMPRESSION: 1. No acute intracranial abnormality. 2. Findings of chronic small vessel ischemia. Electronically Signed   By: Deatra Robinson M.D.   On: 06/20/2022 22:08   CT Head Wo Contrast  Result Date: 06/20/2022 CLINICAL DATA:  Generalized weakness, headache, hypertension EXAM: CT HEAD WITHOUT CONTRAST TECHNIQUE: Contiguous axial images were obtained from the base of the skull through the vertex without intravenous contrast. RADIATION DOSE REDUCTION: This exam was performed according to the departmental dose-optimization program which includes automated exposure control, adjustment of the mA and/or kV according to patient size and/or use of iterative reconstruction technique. COMPARISON:  03/10/2022 FINDINGS: Brain: No acute infarct or hemorrhage. Lateral ventricles and midline structures are unremarkable. No acute extra-axial fluid collections. No mass effect. Vascular: No hyperdense vessel or  unexpected calcification. Skull: Normal. Negative for fracture or focal lesion. Sinuses/Orbits: No acute finding. Other: None. IMPRESSION: 1. No acute intracranial process. Electronically Signed   By: Sharlet Salina M.D.   On: 06/20/2022 18:14   DG Chest Port 1 View  Result Date: 06/20/2022 CLINICAL DATA:  Generalized weakness, headache, hypertension EXAM: PORTABLE CHEST 1 VIEW COMPARISON:  04/10/2022 FINDINGS: Single frontal view of the chest demonstrates an unremarkable cardiac silhouette. Continued ectasia of the thoracic aorta. No airspace disease, effusion, or pneumothorax. No acute bony abnormalities. IMPRESSION: 1. No acute intrathoracic process. Electronically Signed   By: Sharlet Salina M.D.   On: 06/20/2022 18:05    Procedures Procedures    Medications Ordered in ED Medications  LORazepam (ATIVAN) injection 0.5 mg (0.5 mg Intravenous Given 06/20/22 2105)    ED Course/ Medical Decision Making/ A&P                             Medical Decision Making Amount and/or Complexity of Data Reviewed Labs: ordered. Radiology: ordered.  Risk Prescription drug management.   Patient presents with weakness.  She noted it first in the upper extremities and right greater than left.  On examination at this time patient does not have focal neurologic deficit.  Does not meet criteria for code stroke.  Will proceed with broad diagnostic evaluation for combination of generalized weakness and malaise with report of some more localizing weakness.  CT no acute findings.  Renal function stable GFR greater than 60 no leukocytosis or anemia.  Urinalysis negative for UTI.  2 sets of troponins negative.  MRI obtained to definitively rule out stroke.  MRI shows no acute findings.  His blood pressures have improved to normotensive spontaneously.  No evidence of hypertensive encephalopathy or emergency.  At this time patient is stable for discharge.  Her daughter is at bedside.  I have reviewed diagnostic  results and plan with patient and her daughter.  They voiced understanding.        Final Clinical Impression(s) / ED Diagnoses Final diagnoses:  General weakness  Upper extremity weakness    Rx / DC Orders ED Discharge Orders     None         Arby Barrette, MD 06/20/22 2219

## 2022-06-20 NOTE — ED Triage Notes (Signed)
Pt coming from home, c/o generalized weakness and headache. High BP of 170/90, took half of her BP medication and her BP came down to 156/82 and her symptoms have started going away.

## 2022-06-26 DIAGNOSIS — H35363 Drusen (degenerative) of macula, bilateral: Secondary | ICD-10-CM | POA: Insufficient documentation

## 2022-06-26 DIAGNOSIS — H18513 Endothelial corneal dystrophy, bilateral: Secondary | ICD-10-CM | POA: Insufficient documentation

## 2022-06-26 DIAGNOSIS — H43813 Vitreous degeneration, bilateral: Secondary | ICD-10-CM | POA: Insufficient documentation

## 2022-06-26 DIAGNOSIS — Z961 Presence of intraocular lens: Secondary | ICD-10-CM | POA: Insufficient documentation

## 2022-06-26 DIAGNOSIS — H11153 Pinguecula, bilateral: Secondary | ICD-10-CM | POA: Insufficient documentation

## 2022-07-26 DIAGNOSIS — J029 Acute pharyngitis, unspecified: Secondary | ICD-10-CM | POA: Insufficient documentation

## 2022-08-05 ENCOUNTER — Other Ambulatory Visit: Payer: Self-pay | Admitting: Physician Assistant

## 2022-11-29 DIAGNOSIS — R300 Dysuria: Secondary | ICD-10-CM | POA: Insufficient documentation

## 2023-01-23 ENCOUNTER — Encounter: Payer: Self-pay | Admitting: Physician Assistant
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
## 2023-02-25 ENCOUNTER — Emergency Department (HOSPITAL_COMMUNITY)
Admission: EM | Admit: 2023-02-25 | Discharge: 2023-02-26 | Discharge status: Against Medical Advice | Payer: Medicare HMO | Attending: Emergency Medicine | Admitting: Emergency Medicine

## 2023-02-25 ENCOUNTER — Encounter (HOSPITAL_COMMUNITY): Payer: Self-pay | Admitting: Emergency Medicine

## 2023-02-25 ENCOUNTER — Emergency Department (HOSPITAL_COMMUNITY): Payer: Medicare HMO

## 2023-02-25 DIAGNOSIS — Z8673 Personal history of transient ischemic attack (TIA), and cerebral infarction without residual deficits: Secondary | ICD-10-CM | POA: Insufficient documentation

## 2023-02-25 DIAGNOSIS — R42 Dizziness and giddiness: Secondary | ICD-10-CM | POA: Diagnosis present

## 2023-02-25 DIAGNOSIS — Z7982 Long term (current) use of aspirin: Secondary | ICD-10-CM | POA: Insufficient documentation

## 2023-02-25 DIAGNOSIS — I2510 Atherosclerotic heart disease of native coronary artery without angina pectoris: Secondary | ICD-10-CM | POA: Diagnosis not present

## 2023-02-25 DIAGNOSIS — Z5329 Procedure and treatment not carried out because of patient's decision for other reasons: Secondary | ICD-10-CM | POA: Insufficient documentation

## 2023-02-25 DIAGNOSIS — Z20822 Contact with and (suspected) exposure to covid-19: Secondary | ICD-10-CM | POA: Insufficient documentation

## 2023-02-25 LAB — URINALYSIS, ROUTINE W REFLEX MICROSCOPIC
Bilirubin Urine: NEGATIVE
Glucose, UA: NEGATIVE mg/dL
Hgb urine dipstick: NEGATIVE
Ketones, ur: NEGATIVE mg/dL
Leukocytes,Ua: NEGATIVE
Nitrite: NEGATIVE
Protein, ur: NEGATIVE mg/dL
Specific Gravity, Urine: 1.003 — ABNORMAL LOW (ref 1.005–1.030)
pH: 7 (ref 5.0–8.0)

## 2023-02-25 LAB — CBC
HCT: 43.2 % (ref 36.0–46.0)
Hemoglobin: 14.1 g/dL (ref 12.0–15.0)
MCH: 28.8 pg (ref 26.0–34.0)
MCHC: 32.6 g/dL (ref 30.0–36.0)
MCV: 88.3 fL (ref 80.0–100.0)
Platelets: 320 10*3/uL (ref 150–400)
RBC: 4.89 MIL/uL (ref 3.87–5.11)
RDW: 13.2 % (ref 11.5–15.5)
WBC: 9.2 10*3/uL (ref 4.0–10.5)
nRBC: 0 % (ref 0.0–0.2)

## 2023-02-25 LAB — RESP PANEL BY RT-PCR (RSV, FLU A&B, COVID)  RVPGX2
Influenza A by PCR: NEGATIVE
Influenza B by PCR: NEGATIVE
Resp Syncytial Virus by PCR: NEGATIVE
SARS Coronavirus 2 by RT PCR: NEGATIVE

## 2023-02-25 LAB — BASIC METABOLIC PANEL
Anion gap: 10 (ref 5–15)
BUN: 8 mg/dL (ref 8–23)
CO2: 29 mmol/L (ref 22–32)
Calcium: 9.4 mg/dL (ref 8.9–10.3)
Chloride: 97 mmol/L — ABNORMAL LOW (ref 98–111)
Creatinine, Ser: 0.97 mg/dL (ref 0.44–1.00)
GFR, Estimated: 59 mL/min — ABNORMAL LOW (ref >60)
Glucose, Bld: 84 mg/dL (ref 70–99)
Potassium: 4.2 mmol/L (ref 3.5–5.1)
Sodium: 136 mmol/L (ref 135–145)

## 2023-02-25 MED ORDER — MECLIZINE HCL 25 MG PO TABS
50.0000 mg | ORAL_TABLET | Freq: Once | ORAL | Status: AC
  Administered 2023-02-25: 50 mg via ORAL
  Filled 2023-02-25: qty 2

## 2023-02-25 NOTE — ED Triage Notes (Signed)
Pt complains of dizziness that started today. Pt states that this happens every now and again. Pt states she lives in an apartment and there is a scent that comes through her vent that will trigger her dizziness. Pt denies any falls.

## 2023-02-25 NOTE — ED Provider Triage Note (Signed)
Emergency Medicine Provider Triage Evaluation Note  Marissa Dodson , a 83 y.o. female  was evaluated in triage.  Pt complains of dizziness.  She reports intermittent dizziness for the past several days.  She states that there is an odor in her house that triggers her to feel dizzy.  Patient reports that she feels still feels dizzy at this time.  Endorses nausea without vomiting.  No recent head injuries.  History of stroke in the past.  Review of Systems  Positive: Dizziness Negative: Shortness of breath, chest pain  Physical Exam  BP 135/70 (BP Location: Left Arm)   Pulse 66   Temp 98 F (36.7 C) (Oral)   Resp 17   SpO2 94%  Gen:   Awake, no distress   Resp:  Normal effort  MSK:   Moves extremities without difficulty  Other:    Medical Decision Making  Medically screening exam initiated at 5:21 PM.  Appropriate orders placed.  KAILEIA FLOW was informed that the remainder of the evaluation will be completed by another provider, this initial triage assessment does not replace that evaluation, and the importance of remaining in the ED until their evaluation is complete.   Maxwell Marion, PA-C 02/25/23 1735

## 2023-02-26 ENCOUNTER — Other Ambulatory Visit: Payer: Medicare HMO

## 2023-02-26 ENCOUNTER — Encounter: Payer: Self-pay | Admitting: Physician Assistant

## 2023-02-26 ENCOUNTER — Ambulatory Visit: Payer: Medicare HMO

## 2023-02-26 ENCOUNTER — Ambulatory Visit: Payer: Medicare HMO | Admitting: Physician Assistant

## 2023-02-26 VITALS — BP 131/67 | HR 67 | Resp 20 | Ht 63.0 in | Wt 225.0 lb

## 2023-02-26 DIAGNOSIS — R413 Other amnesia: Secondary | ICD-10-CM | POA: Diagnosis not present

## 2023-02-26 DIAGNOSIS — R4789 Other speech disturbances: Secondary | ICD-10-CM

## 2023-02-26 MED ORDER — LORAZEPAM 1 MG PO TABS: 2.0000 mg | ORAL_TABLET | Freq: Once | ORAL | Status: DC | PRN

## 2023-02-26 NOTE — ED Notes (Signed)
Pt did not want to wait any longer and husband picked her up. Pt signed out AMA. Educated pt of benefits and risks.

## 2023-02-26 NOTE — ED Provider Notes (Signed)
[place] EMERGENCY DEPARTMENT AT [facility] Provider Note   CSN: [id number] Arrival date & time: 02/25/23  1608     History  Chief Complaint  Patient presents with   Dizziness   HPI Marissa Dodson is a 83 y.o. female with history of TIA in 2023 and CAD presenting for dizziness.  States it has been intermittent for about a month.  She states that she moved into a new apartment and there is a "new odor" that she states is causing her dizziness.  Endorsing generalized weakness and at times issues with balance.  Denies any preceding chest pain or shortness of breath or palpitations.  States that after the meclizine that she received on arrival dizziness had improved but still present. Worse with turning her head. Denies recent URI symptoms.  Continues to take a daily aspirin.  Dizziness      Home Medications Prior to Admission medications   Medication Sig Start Date End Date Taking? Authorizing Provider  acetaminophen (TYLENOL) 500 MG tablet Take 500 mg by mouth daily as needed for headache.    [provider]  albuterol (VENTOLIN HFA) 108 (90 Base) MCG/ACT inhaler Inhale 2 puffs into the lungs every 6 (six) hours as needed for wheezing or shortness of breath.    [provider]  amitriptyline (ELAVIL) 50 MG tablet Take 50 mg by mouth at bedtime.    [provider]  aspirin EC 81 MG tablet Take 1 tablet (81 mg total) by mouth daily. 10/13/21   Meredeth Ide, MD  atenolol (TENORMIN) 50 MG tablet Take 1 tablet by mouth daily, may take extra 1/2 dose daily as needed for systolic bp over 409 01/31/22   Lyn Records, MD  Carboxymethylcellul-Glycerin (LUBRICATING EYE DROPS OP) Place 1 drop into both eyes daily as needed (dry eyes).    [provider]  cetirizine (ZYRTEC) 10 MG tablet Take 10 mg by mouth daily.    [provider]  citalopram (CELEXA) 10 MG tablet Take 1 tablet (10 mg total) by mouth daily. 01/01/22   Levert Feinstein, MD   furosemide (LASIX) 40 MG tablet Take 20 mg by mouth See admin instructions. 1/2 tablet (20 mg) every morning, takes the other 1/2 tablet (20 mg) later in the day if needed for swelling    [provider]  meclizine (ANTIVERT) 25 MG tablet Take 1 tablet (25 mg total) by mouth 3 (three) times daily as needed for dizziness. 12/21/21   Elayne Snare K, DO  omeprazole (PRILOSEC) 20 MG capsule Take 20 mg by mouth daily. 07/17/16   [provider]  ondansetron (ZOFRAN) 4 MG tablet Take 1 tablet (4 mg total) by mouth every 6 (six) hours. 12/21/21   Elayne Snare K, DO  polyethylene glycol powder (GLYCOLAX/MIRALAX) 17 GM/SCOOP powder Take 17 g by mouth daily as needed for mild constipation. 08/30/20   [provider]  potassium chloride SA (KLOR-CON M) 20 MEQ tablet TAKE 1 TABLET(20 MEQ) BY MOUTH DAILY 08/06/22   Janetta Hora, PA-C      Allergies    Ketek [telithromycin], Toprol xl [metoprolol], Cephalosporins, Codeine, Durezol [difluprednate], Flagyl [metronidazole], Lamictal [lamotrigine], Levaquin [levofloxacin], Maxzide [hydrochlorothiazide w-triamterene], Mobic [meloxicam], Motrin [ibuprofen], Nsaids, Prednisone, Rosuvastatin, and Vistaril [hydroxyzine]    Review of Systems   Review of Systems  Neurological:  Positive for dizziness.    Physical Exam Updated Vital Signs BP (!) 150/84 (BP Location: Right Arm)   Pulse 71   Temp 97.9 F (  36.6 C) (Oral)   Resp 20   SpO2 100%  Physical Exam Vitals and nursing note reviewed.  HENT:     Head: Normocephalic and atraumatic.     Mouth/Throat:     Mouth: Mucous membranes are moist.  Eyes:     General:        Right eye: No discharge.        Left eye: No discharge.     Conjunctiva/sclera: Conjunctivae normal.  Cardiovascular:     Rate and Rhythm: Normal rate and regular rhythm.     Pulses: Normal pulses.     Heart sounds: Normal heart sounds.  Pulmonary:     Effort: Pulmonary effort is normal.      Breath sounds: Normal breath sounds.  Abdominal:     General: Abdomen is flat.     Palpations: Abdomen is soft.  Skin:    General: Skin is warm and dry.  Neurological:     General: No focal deficit present.     Comments: GCS 15. Speech is goal oriented. No deficits appreciated to CN III-XII; symmetric eyebrow raise, no facial drooping, tongue midline. Patient has equal grip strength bilaterally with 5/5 strength against resistance in all major muscle groups bilaterally. Sensation to light touch intact. Patient moves extremities without ataxia. Normal finger-nose-finger. Patient ambulatory with steady gait.  Psychiatric:        Mood and Affect: Mood normal.     ED Results / Procedures / Treatments   Labs (all labs ordered are listed, but only abnormal results are displayed) Labs Reviewed  BASIC METABOLIC PANEL - Abnormal; Notable for the following components:      Result Value   Chloride 97 (*)    GFR, Estimated 59 (*)    All other components within normal limits  URINALYSIS, ROUTINE W REFLEX MICROSCOPIC - Abnormal; Notable for the following components:   Color, Urine STRAW (*)    Specific Gravity, Urine 1.003 (*)    All other components within normal limits  RESP PANEL BY RT-PCR (RSV, FLU A&B, COVID)  [id number]  CBC    EKG EKG Interpretation Date/Time:  Tuesday February 25 2023 17:11:59 EST Ventricular Rate:  70 PR Interval:  170 QRS Duration:  82 QT Interval:  408 QTC Calculation: 440 R Axis:   -34  Text Interpretation: Normal sinus rhythm Left axis deviation Possible Anterior infarct , age undetermined Abnormal ECG When compared with ECG of 20-Jun-2022 19:17, No significant change was found Confirmed by Dione Booze ([id number]) on 02/25/2023 11:32:37 PM  Radiology CT Head Wo Contrast Result Date: 02/25/2023 CLINICAL DATA:  Vertigo and peripheral dizziness. EXAM: CT HEAD WITHOUT CONTRAST TECHNIQUE: Contiguous axial images were obtained from the base of the skull through the  vertex without intravenous contrast. RADIATION DOSE REDUCTION: This exam was performed according to the departmental dose-optimization program which includes automated exposure control, adjustment of the mA and/or kV according to patient size and/or use of iterative reconstruction technique. COMPARISON:  MRI brain and CT head 06/20/2022 FINDINGS: Brain: No evidence of acute infarction, hemorrhage, hydrocephalus, extra-axial collection or mass lesion/mass effect. Mild ventricular dilatation consistent with central atrophy. Patchy low-attenuation changes in the deep white matter consistent with small vessel ischemia. Similar appearance to prior studies. Vascular: No hyperdense vessel or unexpected calcification. Skull: Normal. Negative for fracture or focal lesion. Sinuses/Orbits: No acute finding. Other: None. IMPRESSION: No acute intracranial abnormalities. Mild cerebral atrophy and small vessel ischemic changes. Electronically Signed   By: Marisa Cyphers.D.  On: 02/25/2023 19:54    Procedures Procedures    Medications Ordered in ED Medications  LORazepam (ATIVAN) tablet 2 mg (has no administration in time range)  meclizine (ANTIVERT) tablet 50 mg (50 mg Oral Given 02/25/23 1739)    ED Course/ Medical Decision Making/ A&P                                 Medical Decision Making Amount and/or Complexity of Data Reviewed Labs: ordered.   Initial Impression and Ddx 83 year old well-appearing female presenting for dizziness.  Exam was unremarkable.  DDx includes stroke, electrolyte derangement, UTI, dehydration, other. Patient PMH that increases complexity of ED encounter:  history of TIA in 2023 and CAD  Interpretation of Diagnostics - I independent reviewed and interpreted the labs as followed: No acute derangements  - I independently visualized the following imaging with scope of interpretation limited to determining acute life threatening conditions related to emergency care: CT head  non con, which revealed no acute intracranial pathology  -I personally reviewed and interpreted EKG which revealed normal sinus rhythm  Patient Reassessment and Ultimate Disposition/Management Given her risk factors for stroke and advanced age along with persistence of her symptoms, felt it warranted further evaluation with MRI.  Patient refused the MRI and was "ready to leave".  Stating she did not want to miss her neurology appointment this afternoon.  Patient discharged AGAINST MEDICAL ADVICE.  Patient management required discussion with the following services or consulting groups:  None  Complexity of Problems Addressed Acute complicated illness or Injury  Additional Data Reviewed and Analyzed Further history obtained from: Past medical history and medications listed in the EMR and Prior ED visit notes  Patient Encounter Risk Assessment Prescriptions         Final Clinical Impression(s) / ED Diagnoses Final diagnoses:  Dizziness    Rx / DC Orders ED Discharge Orders     None         Gareth Eagle, PA-C 02/26/23 0731    Dione Booze, MD 02/26/23 [phone]

## 2023-02-26 NOTE — Patient Instructions (Addendum)
It was a pleasure to see you today at our office.   Recommendations:  Neurocognitive evaluation at our office   MRI of the brain, the radiology office will call you to arrange you appointment  [phone] Check labs today  [address] Follow up in  3  months    For psychiatric meds, mood meds: Please have your primary care physician manage these medications.  If you have any severe symptoms of a stroke, or other severe issues such as confusion,severe chills or fever, etc call 911 or go to the ER as you may need to be evaluated further   For guidance regarding [facility] and if placement were needed at the facility, contact Social Worker tel: [phone]  For assessment of decision of mental capacity and competency:  Call Dr. Erick Blinks, geriatric psychiatrist at [phone]  Counseling regarding caregiver distress, including caregiver depression, anxiety and issues regarding community resources, adult day care programs, adult living facilities, or memory care questions:  please contact your  Primary Doctor's Social Worker   Whom to call: Memory  decline, memory medications: Call our office [phone]    [email or web address]   RECOMMENDATIONS FOR ALL PATIENTS WITH MEMORY PROBLEMS: 1. Continue to exercise (Recommend 30 minutes of walking everyday, or 3 hours every week) 2. Increase social interactions - continue going to [place] and enjoy social gatherings with friends and family 3. Eat healthy, avoid fried foods and eat more fruits and vegetables 4. Maintain adequate blood pressure, blood sugar, and blood cholesterol level. Reducing the risk of stroke and cardiovascular disease also helps promoting better memory. 5. Avoid stressful situations. Live a simple life and avoid aggravations. Organize your time and prepare for the next day in anticipation. 6. Sleep well, avoid any interruptions of sleep and avoid  any distractions in the bedroom that may interfere with adequate sleep quality 7. Avoid sugar, avoid sweets as there is a strong link between excessive sugar intake, diabetes, and cognitive impairment We discussed the Mediterranean diet, which has been shown to help patients reduce the risk of progressive memory disorders and reduces cardiovascular risk. This includes eating fish, eat fruits and green leafy vegetables, nuts like almonds and hazelnuts, walnuts, and also use olive oil. Avoid fast foods and fried foods as much as possible. Avoid sweets and sugar as sugar use has been linked to worsening of memory function.  There is always a concern of gradual progression of memory problems. If this is the case, then we may need to adjust level of care according to patient needs. Support, both to the patient and caregiver, should then be put into place.      You have been referred for a neuropsychological evaluation (i.e., evaluation of memory and thinking abilities). Please bring someone with you to this appointment if possible, as it is helpful for the doctor to hear from both you and another adult who knows you well. Please bring eyeglasses and hearing aids if you wear them.    The evaluation will take approximately 3 hours and has two parts:   The first part is a clinical interview with the neuropsychologist (Dr. Milbert Coulter or Dr. Roseanne Reno). During the interview, the neuropsychologist will speak with you and the individual you brought to the appointment.    The second part of the evaluation is testing with the doctor's technician Annabelle Harman or Selena Batten). During the testing, the technician will ask you to remember different types of material, solve problems, and answer some questionnaires. Your family member will  not be present for this portion of the evaluation.   Please note: We must reserve several hours of the neuropsychologist's time and the psychometrician's time for your evaluation appointment. As such, there  is a No-Show fee of $100. If you are unable to attend any of your appointments, please contact our office as soon as possible to reschedule.      DRIVING: Regarding driving, in patients with progressive memory problems, driving will be impaired. We advise to have someone else do the driving if trouble finding directions or if minor accidents are reported. Independent driving assessment is available to determine safety of driving.   If you are interested in the driving assessment, you can contact the following:  [employer] in [place] [phone]  [facility] [phone]  [facility] [place] [phone]  [facility] [phone] or [phone]   FALL PRECAUTIONS: Be cautious when walking. Scan the area for obstacles that may increase the risk of trips and falls. When getting up in the mornings, sit up at the edge of the bed for a few minutes before getting out of bed. Consider elevating the bed at the head end to avoid drop of blood pressure when getting up. Walk always in a well-lit room (use night lights in the walls). Avoid area rugs or power cords from appliances in the middle of the walkways. Use a walker or a cane if necessary and consider physical therapy for balance exercise. Get your eyesight checked regularly.  FINANCIAL OVERSIGHT: Supervision, especially oversight when making financial decisions or transactions is also recommended.  HOME SAFETY: Consider the safety of the kitchen when operating appliances like stoves, microwave oven, and blender. Consider having supervision and share cooking responsibilities until no longer able to participate in those. Accidents with firearms and other hazards in the house should be identified and addressed as well.   ABILITY TO BE LEFT ALONE: If patient is unable to contact 911 operator, consider using LifeLine, or when the need is there, arrange for someone to stay with patients. Smoking is a fire  hazard, consider supervision or cessation. Risk of wandering should be assessed by caregiver and if detected at any point, supervision and safe proof recommendations should be instituted.  MEDICATION SUPERVISION: Inability to self-administer medication needs to be constantly addressed. Implement a mechanism to ensure safe administration of the medications.      Mediterranean Diet A Mediterranean diet refers to food and lifestyle choices that are based on the traditions of countries located on the [place]. This way of eating has been shown to help prevent certain conditions and improve outcomes for people who have chronic diseases, like kidney disease and heart disease. What are tips for following this plan? Lifestyle  Cook and eat meals together with your family, when possible. Drink enough fluid to keep your urine clear or pale yellow. Be physically active every day. This includes: Aerobic exercise like running or swimming. Leisure activities like gardening, walking, or housework. Get 7-8 hours of sleep each night. If recommended by your health care provider, drink red wine in moderation. This means 1 glass a day for nonpregnant women and 2 glasses a day for men. A glass of wine equals 5 oz (150 mL). Reading food labels  Check the serving size of packaged foods. For foods such as rice and pasta, the serving size refers to the amount of cooked product, not dry. Check the total fat in packaged foods. Avoid foods that have saturated fat or trans fats. Check  the ingredients list for added sugars, such as corn syrup. Shopping  At the grocery store, buy most of your food from the areas near the walls of the store. This includes: Fresh fruits and vegetables (produce). Grains, beans, nuts, and seeds. Some of these may be available in unpackaged forms or large amounts (in bulk). Fresh seafood. Poultry and eggs. Low-fat dairy products. Buy whole ingredients instead of prepackaged  foods. Buy fresh fruits and vegetables in-season from local farmers markets. Buy frozen fruits and vegetables in resealable bags. If you do not have access to quality fresh seafood, buy precooked frozen shrimp or canned fish, such as tuna, salmon, or sardines. Buy small amounts of raw or cooked vegetables, salads, or olives from the deli or salad bar at your store. Stock your pantry so you always have certain foods on hand, such as olive oil, canned tuna, canned tomatoes, rice, pasta, and beans. Cooking  Cook foods with extra-virgin olive oil instead of using butter or other vegetable oils. Have meat as a side dish, and have vegetables or grains as your main dish. This means having meat in small portions or adding small amounts of meat to foods like pasta or stew. Use beans or vegetables instead of meat in common dishes like chili or lasagna. Experiment with different cooking methods. Try roasting or broiling vegetables instead of steaming or sauteing them. Add frozen vegetables to soups, stews, pasta, or rice. Add nuts or seeds for added healthy fat at each meal. You can add these to yogurt, salads, or vegetable dishes. Marinate fish or vegetables using olive oil, lemon juice, garlic, and fresh herbs. Meal planning  Plan to eat 1 vegetarian meal one day each week. Try to work up to 2 vegetarian meals, if possible. Eat seafood 2 or more times a week. Have healthy snacks readily available, such as: Vegetable sticks with hummus. Greek yogurt. Fruit and nut trail mix. Eat balanced meals throughout the week. This includes: Fruit: 2-3 servings a day Vegetables: 4-5 servings a day Low-fat dairy: 2 servings a day Fish, poultry, or lean meat: 1 serving a day Beans and legumes: 2 or more servings a week Nuts and seeds: 1-2 servings a day Whole grains: 6-8 servings a day Extra-virgin olive oil: 3-4 servings a day Limit red meat and sweets to only a few servings a month What are my food  choices? Mediterranean diet Recommended Grains: Whole-grain pasta. Brown rice. Bulgar wheat. Polenta. Couscous. Whole-wheat bread. Orpah Cobb. Vegetables: Artichokes. Beets. Broccoli. Cabbage. Carrots. Eggplant. Green beans. Chard. Kale. Spinach. Onions. Leeks. Peas. Squash. Tomatoes. Peppers. Radishes. Fruits: Apples. Apricots. Avocado. Berries. Bananas. Cherries. Dates. Figs. Grapes. Lemons. Melon. Oranges. Peaches. Plums. Pomegranate. Meats and other protein foods: Beans. Almonds. Sunflower seeds. Pine nuts. Peanuts. Cod. Salmon. Scallops. Shrimp. Tuna. Tilapia. Clams. Oysters. Eggs. Dairy: Low-fat milk. Cheese. Greek yogurt. Beverages: Water. Red wine. Herbal tea. Fats and oils: Extra virgin olive oil. Avocado oil. Grape seed oil. Sweets and desserts: [place] yogurt with honey. Baked apples. Poached pears. Trail mix. Seasoning and other foods: Basil. Cilantro. Coriander. Cumin. Mint. Parsley. Sage. Rosemary. Tarragon. Garlic. Oregano. Thyme. Pepper. Balsalmic vinegar. Tahini. Hummus. Tomato sauce. Olives. Mushrooms. Limit these Grains: Prepackaged pasta or rice dishes. Prepackaged cereal with added sugar. Vegetables: Deep fried potatoes (french fries). Fruits: Fruit canned in syrup. Meats and other protein foods: Beef. Pork. Lamb. Poultry with skin. Hot dogs. Tomasa Blase. Dairy: Ice cream. Sour cream. Whole milk. Beverages: Juice. Sugar-sweetened soft drinks. Beer. Liquor and spirits. Fats and oils: Butter.  Canola oil. Vegetable oil. Beef fat (tallow). Lard. Sweets and desserts: Cookies. Cakes. Pies. Candy. Seasoning and other foods: Mayonnaise. Premade sauces and marinades. The items listed may not be a complete list. Talk with your dietitian about what dietary choices are right for you. Summary The Mediterranean diet includes both food and lifestyle choices. Eat a variety of fresh fruits and vegetables, beans, nuts, seeds, and whole grains. Limit the amount of red meat and sweets that  you eat. Talk with your health care provider about whether it is safe for you to drink red wine in moderation. This means 1 glass a day for nonpregnant women and 2 glasses a day for men. A glass of wine equals 5 oz (150 mL). This information is not intended to replace advice given to you by your health care provider. Make sure you discuss any questions you have with your health care provider. Document Released: 09/07/2015 Document Revised: 10/10/2015 Document Reviewed: 09/07/2015 Elsevier Interactive Patient Education  2017 [employer].

## 2023-02-27 LAB — VITAMIN B12: Vitamin B-12: 335 pg/mL (ref 200–1100)

## 2023-02-27 LAB — TSH: TSH: 0.96 m[IU]/L (ref 0.40–4.50)

## 2023-02-27 NOTE — Progress Notes (Signed)
B12 on the low side,  Recommend starting on vitamin B12 1000 mcg daily.  Thyroid normal ,Follow-up with PCP.  Thank you

## 2023-03-10 ENCOUNTER — Telehealth: Payer: Self-pay | Admitting: Physician Assistant

## 2023-03-10 NOTE — Telephone Encounter (Signed)
 Called pt and no answer could not leave message.

## 2023-03-10 NOTE — Telephone Encounter (Signed)
 Pt called in and left a message with the access nurse on 03/08/23. Caller states there is an odor coming through her vents. Since it started she is having shaking, headache, and SOB. She says it feels like her heart is racing.  Full access nurse report is in [employer]

## 2023-03-11 NOTE — Telephone Encounter (Signed)
Called pt back and she reported feeling better and she was thankful for [place] to call and check on her.

## 2023-03-20 ENCOUNTER — Telehealth: Payer: Self-pay | Admitting: Physician Assistant

## 2023-03-20 NOTE — Telephone Encounter (Signed)
 Pt Daughter who is on the DPR called she is going to call her moms PCP to try and get her seen for a UTI, She did not know her moms PCP she was given the number to call, if she can not get her in with the PCP they will go to urgent care,

## 2023-03-20 NOTE — Telephone Encounter (Signed)
 The patients daughter called to see if there was any way someone could call and help her with her mother. Her mothers appt for an MRI is 04/10/23. Wants to know if they can possibly get this STAT. Her mother keeps calling the law stating people are coming in her home. Saying her neighbor is going to kill her. Put chemicals in the air ducts. The daughter states they have cameras every where so they know that is not true. They don't know what to do. The mother will not allow her to do anything. I did ask if she had been testing for a UTI, she has not. I told her  would mark this HIGH priority and have someone call

## 2023-03-22 ENCOUNTER — Encounter (HOSPITAL_BASED_OUTPATIENT_CLINIC_OR_DEPARTMENT_OTHER): Payer: Self-pay | Admitting: *Deleted

## 2023-03-22 ENCOUNTER — Emergency Department (HOSPITAL_BASED_OUTPATIENT_CLINIC_OR_DEPARTMENT_OTHER)
Admission: EM | Admit: 2023-03-22 | Discharge: 2023-03-22 | Disposition: A | Discharge status: Home / Self Care | Payer: Medicare HMO | Attending: Emergency Medicine | Admitting: Emergency Medicine

## 2023-03-22 ENCOUNTER — Emergency Department (HOSPITAL_BASED_OUTPATIENT_CLINIC_OR_DEPARTMENT_OTHER): Payer: Medicare HMO

## 2023-03-22 ENCOUNTER — Other Ambulatory Visit: Payer: Self-pay

## 2023-03-22 DIAGNOSIS — R609 Edema, unspecified: Secondary | ICD-10-CM

## 2023-03-22 DIAGNOSIS — Z7982 Long term (current) use of aspirin: Secondary | ICD-10-CM | POA: Diagnosis not present

## 2023-03-22 DIAGNOSIS — I10 Essential (primary) hypertension: Secondary | ICD-10-CM | POA: Diagnosis not present

## 2023-03-22 DIAGNOSIS — Z79899 Other long term (current) drug therapy: Secondary | ICD-10-CM | POA: Insufficient documentation

## 2023-03-22 DIAGNOSIS — R600 Localized edema: Secondary | ICD-10-CM | POA: Diagnosis not present

## 2023-03-22 DIAGNOSIS — R0789 Other chest pain: Secondary | ICD-10-CM | POA: Insufficient documentation

## 2023-03-22 LAB — CBC
HCT: 40.2 % (ref 36.0–46.0)
Hemoglobin: 13.4 g/dL (ref 12.0–15.0)
MCH: 29.3 pg (ref 26.0–34.0)
MCHC: 33.3 g/dL (ref 30.0–36.0)
MCV: 87.8 fL (ref 80.0–100.0)
Platelets: 300 10*3/uL (ref 150–400)
RBC: 4.58 MIL/uL (ref 3.87–5.11)
RDW: 12.9 % (ref 11.5–15.5)
WBC: 9.1 10*3/uL (ref 4.0–10.5)
nRBC: 0 % (ref 0.0–0.2)

## 2023-03-22 LAB — BASIC METABOLIC PANEL
Anion gap: 11 (ref 5–15)
BUN: 10 mg/dL (ref 8–23)
CO2: 24 mmol/L (ref 22–32)
Calcium: 8.9 mg/dL (ref 8.9–10.3)
Chloride: 95 mmol/L — ABNORMAL LOW (ref 98–111)
Creatinine, Ser: 0.83 mg/dL (ref 0.44–1.00)
GFR, Estimated: >60 mL/min (ref >60)
Glucose, Bld: 138 mg/dL — ABNORMAL HIGH (ref 70–99)
Potassium: 3.6 mmol/L (ref 3.5–5.1)
Sodium: 130 mmol/L — ABNORMAL LOW (ref 135–145)

## 2023-03-22 LAB — BRAIN NATRIURETIC PEPTIDE: B Natriuretic Peptide: 129.4 pg/mL — ABNORMAL HIGH (ref 0.0–100.0)

## 2023-03-22 LAB — TROPONIN I (HIGH SENSITIVITY)
Troponin I (High Sensitivity): 4 ng/L (ref <18)
Troponin I (High Sensitivity): 4 ng/L (ref <18)

## 2023-03-22 MED ORDER — FUROSEMIDE 40 MG PO TABS: 20.0000 mg | ORAL_TABLET | Freq: Every day | ORAL | 0 refills | Status: AC

## 2023-03-22 NOTE — ED Notes (Signed)
 Pt denies CP at this time; she is most concerned with BLE edema

## 2023-03-22 NOTE — Discharge Instructions (Signed)
 Follow-up with your primary care doctor regarding leg swelling early next week.  Return if symptoms worsen.

## 2023-03-22 NOTE — ED Triage Notes (Signed)
 Pt is here for chest pain with sob which began today around noon.

## 2023-03-22 NOTE — ED Provider Notes (Signed)
 [place] [facility] Provider Note   CSN: [id number] Arrival date & time: 03/22/23  1655     History  Chief Complaint  Patient presents with   Chest Pain    Marissa Dodson is a 83 y.o. female.  Patient here with an episode of chest pain shortness of breath that started around noon now resolved.  She states that she just moved and living in a new place feels a little bit stressed.  She walks daily does not have pain.  She is feeling better now.  She has history of hypertension diverticulitis.  Denies any recent surgery travel.  Denies any active symptoms.  She has no nausea vomiting diarrhea abdominal pain.  Denies any headache vision changes stroke symptoms.  The history is provided by the patient.       Home Medications Prior to Admission medications   Medication Sig Start Date End Date Taking? Authorizing Provider  acetaminophen (TYLENOL) 500 MG tablet Take 500 mg by mouth daily as needed for headache.    [provider]  albuterol (VENTOLIN HFA) 108 (90 Base) MCG/ACT inhaler Inhale 2 puffs into the lungs every 6 (six) hours as needed for wheezing or shortness of breath.    [provider]  amitriptyline (ELAVIL) 50 MG tablet Take 50 mg by mouth at bedtime.    [provider]  aspirin EC 81 MG tablet Take 1 tablet (81 mg total) by mouth daily. 10/13/21   Meredeth Ide, MD  atenolol (TENORMIN) 50 MG tablet Take 1 tablet by mouth daily, may take extra 1/2 dose daily as needed for systolic bp over 782 01/31/22   Lyn Records, MD  Carboxymethylcellul-Glycerin (LUBRICATING EYE DROPS OP) Place 1 drop into both eyes daily as needed (dry eyes).    [provider]  cetirizine (ZYRTEC) 10 MG tablet Take 10 mg by mouth daily.    [provider]  citalopram (CELEXA) 10 MG tablet Take 1 tablet (10 mg total) by mouth daily. Patient not taking: Reported on 02/26/2023 01/01/22   Levert Feinstein, MD  furosemide (LASIX)  40 MG tablet Take 0.5 tablets (20 mg total) by mouth daily for 3 days. 1/2 tablet (20 mg) every morning, takes the other 1/2 tablet (20 mg) later in the day if needed for swelling 03/22/23 03/25/23  Janisa Labus, DO  levETIRAcetam (KEPPRA) 500 MG tablet Take 500 mg by mouth 2 (two) times daily.    [provider]  meclizine (ANTIVERT) 25 MG tablet Take 1 tablet (25 mg total) by mouth 3 (three) times daily as needed for dizziness. 12/21/21   Elayne Snare K, DO  omeprazole (PRILOSEC) 20 MG capsule Take 20 mg by mouth daily. 07/17/16   [provider]  ondansetron (ZOFRAN) 4 MG tablet Take 1 tablet (4 mg total) by mouth every 6 (six) hours. 12/21/21   Elayne Snare K, DO  polyethylene glycol powder (GLYCOLAX/MIRALAX) 17 GM/SCOOP powder Take 17 g by mouth daily as needed for mild constipation. 08/30/20   [provider]  potassium chloride SA (KLOR-CON M) 20 MEQ tablet TAKE 1 TABLET(20 MEQ) BY MOUTH DAILY 08/06/22   Janetta Hora, PA-C      Allergies    Ketek [telithromycin], Toprol xl [metoprolol], Cephalosporins, Codeine, Durezol [difluprednate], Flagyl [metronidazole], Lamictal [lamotrigine], Levaquin [levofloxacin], Maxzide [hydrochlorothiazide w-triamterene], Mobic [meloxicam], Motrin [ibuprofen], Nsaids, Prednisone, Rosuvastatin, and Vistaril [hydroxyzine]    Review of Systems   Review of Systems  Physical Exam Updated Vital  Signs BP (!) 135/55   Pulse (!) 52   Temp 98.1 F (36.7 C)   Resp 17   SpO2 100%  Physical Exam Vitals and nursing note reviewed.  Constitutional:      General: She is not in acute distress.    Appearance: She is well-developed.  HENT:     Head: Normocephalic and atraumatic.  Eyes:     Extraocular Movements: Extraocular movements intact.     Conjunctiva/sclera: Conjunctivae normal.     Pupils: Pupils are equal, round, and reactive to light.  Cardiovascular:     Rate and Rhythm: Normal rate and regular rhythm.      Pulses:          Radial pulses are 2+ on the right side and 2+ on the left side.     Heart sounds: Normal heart sounds. No murmur heard. Pulmonary:     Effort: Pulmonary effort is normal. No respiratory distress.     Breath sounds: Normal breath sounds.  Abdominal:     Palpations: Abdomen is soft.     Tenderness: There is no abdominal tenderness.  Musculoskeletal:        General: No swelling. Normal range of motion.     Cervical back: Normal range of motion and neck supple.     Right lower leg: No edema.     Left lower leg: No edema.  Skin:    General: Skin is warm and dry.     Capillary Refill: Capillary refill takes less than 2 seconds.  Neurological:     General: No focal deficit present.     Mental Status: She is alert.  Psychiatric:        Mood and Affect: Mood normal.     ED Results / Procedures / Treatments   Labs (all labs ordered are listed, but only abnormal results are displayed) Labs Reviewed  BASIC METABOLIC PANEL - Abnormal; Notable for the following components:      Result Value   Sodium 130 (*)    Chloride 95 (*)    Glucose, Bld 138 (*)    All other components within normal limits  BRAIN NATRIURETIC PEPTIDE - Abnormal; Notable for the following components:   B Natriuretic Peptide 129.4 (*)    All other components within normal limits  CBC  TROPONIN I (HIGH SENSITIVITY)  TROPONIN I (HIGH SENSITIVITY)    EKG EKG Interpretation Date/Time:  Saturday March 22 2023 17:01:53 EST Ventricular Rate:  69 PR Interval:  181 QRS Duration:  98 QT Interval:  394 QTC Calculation: 423 R Axis:   -30  Text Interpretation: Sinus rhythm Left axis deviation Low voltage, precordial leads Confirmed by Virgina Norfolk [phone]) on 03/22/2023 5:14:25 PM  Radiology DG Chest 2 View Result Date: 03/22/2023 CLINICAL DATA:  Squeezing chest pain and shortness of breath since noon today EXAM: CHEST - 2 VIEW COMPARISON:  06/20/2022 FINDINGS: Frontal and lateral views of the chest  demonstrate a stable cardiac silhouette. Stable ectasia of the thoracic aorta. No airspace disease, effusion, or pneumothorax. No acute bony abnormality. IMPRESSION: 1. Stable chest, no acute process. Electronically Signed   By: Sharlet Salina M.D.   On: 03/22/2023 18:07    Procedures Procedures    Medications Ordered in ED Medications - No data to display  ED Course/ Medical Decision Making/ A&P  Medical Decision Making Amount and/or Complexity of Data Reviewed Labs: ordered. Radiology: ordered.  Risk Prescription drug management.   Marissa Dodson is here with some left-sided chest discomfort or shortness of breath but mostly resolved.  She is concerned about some swelling to her lower legs.  She used to take Lasix but takes it as needed.  She is not having any active chest pain shortness of breath.  She had a brief episode today which she thought was may be anxiety but she was concerned and wanted to be evaluated.  She has a history of memory issues interstitial cystitis hypertension.  I do not notice any gross signs of volume overload on exam.  Maybe trace edema in the legs.  She has had some prior kidney issues in the past but she is overall well-appearing.  Vital signs are reassuring.  EKG shows sinus rhythm.  No ischemic changes.  Differential diagnosis likely musculoskeletal process, atypical process but will evaluate for ACS volume overload heart failure infectious process.  Will get CBC BMP troponin BNP chest x-ray.  Lab work is overall unremarkable.  Troponin negative x 2.  BNP 129.  Chest x-ray without any signs of volume overload or pneumonia.  No significant anemia electrolyte abnormality kidney injury or leukocytosis.  I reviewed interpreted labs and imaging.  Will put her back on Lasix for a few days to help with some mild swelling in her legs.  Discharged in good condition.  Understands return precautions.  This chart was dictated using  voice recognition software.  Despite best efforts to proofread,  errors can occur which can change the documentation meaning.         Final Clinical Impression(s) / ED Diagnoses Final diagnoses:  Atypical chest pain  Edema, unspecified type    Rx / DC Orders ED Discharge Orders          Ordered    furosemide (LASIX) 40 MG tablet  Daily        03/22/23 2128              Virgina Norfolk, DO 03/22/23 2130

## 2023-04-03 ENCOUNTER — Emergency Department (HOSPITAL_COMMUNITY)

## 2023-04-03 ENCOUNTER — Emergency Department (HOSPITAL_COMMUNITY): Admission: EM | Admit: 2023-04-03 | Discharge: 2023-04-04 | Disposition: A | Discharge status: Home / Self Care

## 2023-04-03 ENCOUNTER — Encounter (HOSPITAL_COMMUNITY): Payer: Self-pay

## 2023-04-03 DIAGNOSIS — R2243 Localized swelling, mass and lump, lower limb, bilateral: Secondary | ICD-10-CM | POA: Diagnosis not present

## 2023-04-03 DIAGNOSIS — R0602 Shortness of breath: Secondary | ICD-10-CM | POA: Diagnosis not present

## 2023-04-03 DIAGNOSIS — Z7982 Long term (current) use of aspirin: Secondary | ICD-10-CM | POA: Diagnosis not present

## 2023-04-03 DIAGNOSIS — R0789 Other chest pain: Secondary | ICD-10-CM | POA: Diagnosis not present

## 2023-04-03 DIAGNOSIS — Z79899 Other long term (current) drug therapy: Secondary | ICD-10-CM | POA: Diagnosis not present

## 2023-04-03 DIAGNOSIS — F419 Anxiety disorder, unspecified: Secondary | ICD-10-CM | POA: Diagnosis not present

## 2023-04-03 DIAGNOSIS — R0600 Dyspnea, unspecified: Secondary | ICD-10-CM | POA: Diagnosis present

## 2023-04-03 DIAGNOSIS — R413 Other amnesia: Secondary | ICD-10-CM | POA: Diagnosis present

## 2023-04-03 LAB — COMPREHENSIVE METABOLIC PANEL
ALT: 14 U/L (ref 0–44)
AST: 18 U/L (ref 15–41)
Albumin: 4 g/dL (ref 3.5–5.0)
Alkaline Phosphatase: 81 U/L (ref 38–126)
Anion gap: 10 (ref 5–15)
BUN: 7 mg/dL — ABNORMAL LOW (ref 8–23)
CO2: 27 mmol/L (ref 22–32)
Calcium: 9.1 mg/dL (ref 8.9–10.3)
Chloride: 96 mmol/L — ABNORMAL LOW (ref 98–111)
Creatinine, Ser: 0.82 mg/dL (ref 0.44–1.00)
GFR, Estimated: >60 mL/min (ref >60)
Glucose, Bld: 102 mg/dL — ABNORMAL HIGH (ref 70–99)
Potassium: 3.5 mmol/L (ref 3.5–5.1)
Sodium: 133 mmol/L — ABNORMAL LOW (ref 135–145)
Total Bilirubin: 0.8 mg/dL (ref 0.0–1.2)
Total Protein: 6.9 g/dL (ref 6.5–8.1)

## 2023-04-03 LAB — CBC WITH DIFFERENTIAL/PLATELET
Abs Immature Granulocytes: 0.05 10*3/uL (ref 0.00–0.07)
Basophils Absolute: 0 10*3/uL (ref 0.0–0.1)
Basophils Relative: 0 %
Eosinophils Absolute: 0.1 10*3/uL (ref 0.0–0.5)
Eosinophils Relative: 1 %
HCT: 44.9 % (ref 36.0–46.0)
Hemoglobin: 14.8 g/dL (ref 12.0–15.0)
Immature Granulocytes: 1 %
Lymphocytes Relative: 29 %
Lymphs Abs: 2.7 10*3/uL (ref 0.7–4.0)
MCH: 28.9 pg (ref 26.0–34.0)
MCHC: 33 g/dL (ref 30.0–36.0)
MCV: 87.7 fL (ref 80.0–100.0)
Monocytes Absolute: 0.5 10*3/uL (ref 0.1–1.0)
Monocytes Relative: 6 %
Neutro Abs: 6 10*3/uL (ref 1.7–7.7)
Neutrophils Relative %: 63 %
Platelets: 366 10*3/uL (ref 150–400)
RBC: 5.12 MIL/uL — ABNORMAL HIGH (ref 3.87–5.11)
RDW: 13 % (ref 11.5–15.5)
WBC: 9.4 10*3/uL (ref 4.0–10.5)
nRBC: 0 % (ref 0.0–0.2)

## 2023-04-03 LAB — URINALYSIS, ROUTINE W REFLEX MICROSCOPIC
Bilirubin Urine: NEGATIVE
Glucose, UA: NEGATIVE mg/dL
Hgb urine dipstick: NEGATIVE
Ketones, ur: NEGATIVE mg/dL
Leukocytes,Ua: NEGATIVE
Nitrite: NEGATIVE
Protein, ur: NEGATIVE mg/dL
Specific Gravity, Urine: 1.004 — ABNORMAL LOW (ref 1.005–1.030)
pH: 6 (ref 5.0–8.0)

## 2023-04-03 LAB — RAPID URINE DRUG SCREEN, HOSP PERFORMED
Amphetamines: NOT DETECTED
Barbiturates: NOT DETECTED
Benzodiazepines: NOT DETECTED
Cocaine: NOT DETECTED
Opiates: NOT DETECTED
Tetrahydrocannabinol: NOT DETECTED

## 2023-04-03 LAB — TROPONIN I (HIGH SENSITIVITY): Troponin I (High Sensitivity): 4 ng/L (ref <18)

## 2023-04-03 LAB — AMMONIA: Ammonia: 12 umol/L (ref 9–35)

## 2023-04-03 LAB — TSH: TSH: 1.412 u[IU]/mL (ref 0.350–4.500)

## 2023-04-03 LAB — ETHANOL: Alcohol, Ethyl (B): <10 mg/dL (ref <10)

## 2023-04-03 MED ORDER — ALBUTEROL SULFATE HFA 108 (90 BASE) MCG/ACT IN AERS: 2.0000 | INHALATION_SPRAY | Freq: Four times a day (QID) | RESPIRATORY_TRACT | 0 refills | Status: AC | PRN
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
## 2023-04-03 NOTE — ED Notes (Signed)
 E-Signature pad not working, pt gives verbal consent

## 2023-04-03 NOTE — Discharge Instructions (Addendum)
 Please follow-up with the allergist at the number provided.  Return to the ER for worsening symptoms.  Your Keppra blood levels are pending and will likely not be back until tomorrow.  Please follow-up with your doctor regarding this and continue to take your Keppra as prescribed.
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
## 2023-04-04 ENCOUNTER — Encounter: Payer: Self-pay | Admitting: Physician Assistant

## 2023-04-04 NOTE — ED Notes (Signed)
 Patient informed this RN not to call her son upon discharge. Inquiring how patient would get home, she stated "I will figure it out." Pt encouraged to call son who lives in [place] area, pt states "we had a falling out before I came and I don't want to call him, because he thinks I will need to stay at is house, and it's that wife of his that is the problem." This RN attempted to set up transportation via [place] or [place], pt declined. Pt car is at the [employer] police station, she states, "I don't like driving at night, I'll wait here in the waiting room until it morning, I don't mind."

## 2023-04-04 NOTE — ED Notes (Signed)
Pt placed in lobby

## 2023-04-07 ENCOUNTER — Telehealth: Payer: Self-pay | Admitting: Physician Assistant

## 2023-04-07 ENCOUNTER — Encounter (HOSPITAL_BASED_OUTPATIENT_CLINIC_OR_DEPARTMENT_OTHER): Payer: Self-pay | Admitting: Family Medicine

## 2023-04-07 LAB — LEVETIRACETAM LEVEL: Levetiracetam Lvl: 12.4 ug/mL (ref 10.0–40.0)

## 2023-04-07 NOTE — Telephone Encounter (Signed)
 Caller returning call on behalf of mother. Patient has new phone number and has been updated on chart. Patients daughter would like a call back from nurse.

## 2023-04-07 NOTE — Telephone Encounter (Signed)
 I already spoke to patient Marissa Dodson

## 2023-04-07 NOTE — Telephone Encounter (Signed)
 Daughter notified to take her to ER for evaluation and treatment, Marissa Dodson is one [facility]. Agreed to do so, no further questions were needed and she thanked me for calling.
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
## 2023-04-09 ENCOUNTER — Other Ambulatory Visit: Payer: Self-pay | Admitting: Physician Assistant

## 2023-04-09 ENCOUNTER — Telehealth: Payer: Self-pay | Admitting: Physician Assistant

## 2023-04-09 MED ORDER — DIAZEPAM 2 MG PO TABS: ORAL_TABLET | ORAL | 0 refills | Status: DC

## 2023-04-09 NOTE — Telephone Encounter (Signed)
 Pt called in wanting to get some medication to keep her calm during her MRI tomorrow. Her pharmacy is [phone]

## 2023-04-10 ENCOUNTER — Ambulatory Visit
Admission: RE | Admit: 2023-04-10 | Discharge: 2023-04-10 | Disposition: A | Discharge status: Home / Self Care | Payer: Medicare HMO | Source: Ambulatory Visit | Attending: Physician Assistant | Admitting: Physician Assistant

## 2023-04-10 ENCOUNTER — Other Ambulatory Visit: Payer: Self-pay | Admitting: Physician Assistant

## 2023-04-10 MED ORDER — DIAZEPAM 2 MG PO TABS: ORAL_TABLET | ORAL | 0 refills | Status: AC

## 2023-04-10 NOTE — Telephone Encounter (Signed)
 Left message with the after hour service on 04-09-23 at 5:42 pm  Caller states that her mom is having a MRI on 04-10-23 and they are needing a medication called in to help her relax   [facility] on [address] in [place]  [phone]

## 2023-04-10 NOTE — Telephone Encounter (Signed)
 Since the medication is controlled, the pharmacy needs a new RX. The pharmacy that it was sent to does not have it in stock She needs it sent to [facility]  [address] Her appt is 1:30 today The daughter said she apologies for all this mess but she appreciates yall working with her

## 2023-04-10 NOTE — Telephone Encounter (Signed)
 Nurse Neysa Bonito provided that the Rx was at the pharmacy, caller is contacting pharmacy

## 2023-04-11 NOTE — Progress Notes (Signed)
 The MRI of the brain  showed mild hardening of the small blood vessels in the brain in patients with high cholesterol, blood pressure or sugar control issues, sometimes age as well. It did not show any tumors, stroke or bleed. This is similar tot he prior MRI 1 year ago, Thanks

## 2023-05-15 ENCOUNTER — Encounter: Payer: Self-pay | Admitting: Physician Assistant

## 2023-05-15 ENCOUNTER — Ambulatory Visit: Payer: Medicare HMO | Admitting: Physician Assistant

## 2023-05-15 VITALS — BP 136/69 | HR 74 | Resp 18 | Wt 224.0 lb

## 2023-05-15 DIAGNOSIS — F339 Major depressive disorder, recurrent, unspecified: Secondary | ICD-10-CM

## 2023-05-15 DIAGNOSIS — F411 Generalized anxiety disorder: Secondary | ICD-10-CM

## 2023-05-15 DIAGNOSIS — R413 Other amnesia: Secondary | ICD-10-CM
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
## 2023-05-15 NOTE — Patient Instructions (Addendum)
 It was a pleasure to see you today at our office.   Recommendations:  Neurocognitive evaluation at our office   Referral to psychiatry   Monitor driving Follow up in  6  months    For psychiatric meds, mood meds: Please have your primary care physician manage these medications.  If you have any severe symptoms of a stroke, or other severe issues such as confusion,severe chills or fever, etc call 911 or go to the ER as you may need to be evaluated further   For guidance regarding [facility] and if placement were needed at the facility, contact Social Worker tel: [phone]  For assessment of decision of mental capacity and competency:  Call Dr. Laverne Potter, geriatric psychiatrist at [phone]  Counseling regarding caregiver distress, including caregiver depression, anxiety and issues regarding community resources, adult day care programs, adult living facilities, or memory care questions:  please contact your  Primary Doctor's Social Worker   Whom to call: Memory  decline, memory medications: Call our office [phone]    [email or web address]   RECOMMENDATIONS FOR ALL PATIENTS WITH MEMORY PROBLEMS: 1. Continue to exercise (Recommend 30 minutes of walking everyday, or 3 hours every week) 2. Increase social interactions - continue going to [place] and enjoy social gatherings with friends and family 3. Eat healthy, avoid fried foods and eat more fruits and vegetables 4. Maintain adequate blood pressure, blood sugar, and blood cholesterol level. Reducing the risk of stroke and cardiovascular disease also helps promoting better memory. 5. Avoid stressful situations. Live a simple life and avoid aggravations. Organize your time and prepare for the next day in anticipation. 6. Sleep well, avoid any interruptions of sleep and avoid any distractions in the bedroom that may interfere with adequate sleep  quality 7. Avoid sugar, avoid sweets as there is a strong link between excessive sugar intake, diabetes, and cognitive impairment We discussed the Mediterranean diet, which has been shown to help patients reduce the risk of progressive memory disorders and reduces cardiovascular risk. This includes eating fish, eat fruits and green leafy vegetables, nuts like almonds and hazelnuts, walnuts, and also use olive oil. Avoid fast foods and fried foods as much as possible. Avoid sweets and sugar as sugar use has been linked to worsening of memory function.  There is always a concern of gradual progression of memory problems. If this is the case, then we may need to adjust level of care according to patient needs. Support, both to the patient and caregiver, should then be put into place.      You have been referred for a neuropsychological evaluation (i.e., evaluation of memory and thinking abilities). Please bring someone with you to this appointment if possible, as it is helpful for the doctor to hear from both you and another adult who knows you well. Please bring eyeglasses and hearing aids if you wear them.    The evaluation will take approximately 3 hours and has two parts:   The first part is a clinical interview with the neuropsychologist (Dr. Kitty Perkins or Dr. Annette Barters). During the interview, the neuropsychologist will speak with you and the individual you brought to the appointment.    The second part of the evaluation is testing with the doctor's technician Bernabe Brew or Burdette Carolin). During the testing, the technician will ask you to remember different types of material, solve problems, and answer some questionnaires. Your family member will not be present for this portion of the evaluation.   Please note: We must  reserve several hours of the neuropsychologist's time and the psychometrician's time for your evaluation appointment. As such, there is a No-Show fee of $100. If you are unable to attend any of your  appointments, please contact our office as soon as possible to reschedule.      DRIVING: Regarding driving, in patients with progressive memory problems, driving will be impaired. We advise to have someone else do the driving if trouble finding directions or if minor accidents are reported. Independent driving assessment is available to determine safety of driving.   If you are interested in the driving assessment, you can contact the following:  [employer] in [place] [phone]  [facility] [phone]  [facility] [phone]  [facility] [phone] or [phone]   FALL PRECAUTIONS: Be cautious when walking. Scan the area for obstacles that may increase the risk of trips and falls. When getting up in the mornings, sit up at the edge of the bed for a few minutes before getting out of bed. Consider elevating the bed at the head end to avoid drop of blood pressure when getting up. Walk always in a well-lit room (use night lights in the walls). Avoid area rugs or power cords from appliances in the middle of the walkways. Use a walker or a cane if necessary and consider physical therapy for balance exercise. Get your eyesight checked regularly.  FINANCIAL OVERSIGHT: Supervision, especially oversight when making financial decisions or transactions is also recommended.  HOME SAFETY: Consider the safety of the kitchen when operating appliances like stoves, microwave oven, and blender. Consider having supervision and share cooking responsibilities until no longer able to participate in those. Accidents with firearms and other hazards in the house should be identified and addressed as well.   ABILITY TO BE LEFT ALONE: If patient is unable to contact 911 operator, consider using Life[employer], or when the need is there, arrange for someone to stay with patients. Smoking is a fire hazard, consider supervision or cessation. Risk of wandering should  be assessed by caregiver and if detected at any point, supervision and safe proof recommendations should be instituted.  MEDICATION SUPERVISION: Inability to self-administer medication needs to be constantly addressed. Implement a mechanism to ensure safe administration of the medications.      Mediterranean Diet A Mediterranean diet refers to food and lifestyle choices that are based on the traditions of countries located on the [employer]. This way of eating has been shown to help prevent certain conditions and improve outcomes for people who have chronic diseases, like kidney disease and heart disease. What are tips for following this plan? Lifestyle  Cook and eat meals together with your family, when possible. Drink enough fluid to keep your urine clear or pale yellow. Be physically active every day. This includes: Aerobic exercise like running or swimming. Leisure activities like gardening, walking, or housework. Get 7-8 hours of sleep each night. If recommended by your health care provider, drink red wine in moderation. This means 1 glass a day for nonpregnant women and 2 glasses a day for men. A glass of wine equals 5 oz (150 mL). Reading food labels  Check the serving size of packaged foods. For foods such as rice and pasta, the serving size refers to the amount of cooked product, not dry. Check the total fat in packaged foods. Avoid foods that have saturated fat or trans fats. Check the ingredients list for added sugars, such as corn syrup. Shopping  At the grocery  store, buy most of your food from the areas near the walls of the store. This includes: Fresh fruits and vegetables (produce). Grains, beans, nuts, and seeds. Some of these may be available in unpackaged forms or large amounts (in bulk). Fresh seafood. Poultry and eggs. Low-fat dairy products. Buy whole ingredients instead of prepackaged foods. Buy fresh fruits and vegetables in-season from local farmers  markets. Buy frozen fruits and vegetables in resealable bags. If you do not have access to quality fresh seafood, buy precooked frozen shrimp or canned fish, such as tuna, salmon, or sardines. Buy small amounts of raw or cooked vegetables, salads, or olives from the deli or salad bar at your store. Stock your pantry so you always have certain foods on hand, such as olive oil, canned tuna, canned tomatoes, rice, pasta, and beans. Cooking  Cook foods with extra-virgin olive oil instead of using butter or other vegetable oils. Have meat as a side dish, and have vegetables or grains as your main dish. This means having meat in small portions or adding small amounts of meat to foods like pasta or stew. Use beans or vegetables instead of meat in common dishes like chili or lasagna. Experiment with different cooking methods. Try roasting or broiling vegetables instead of steaming or sauteing them. Add frozen vegetables to soups, stews, pasta, or rice. Add nuts or seeds for added healthy fat at each meal. You can add these to yogurt, salads, or vegetable dishes. Marinate fish or vegetables using olive oil, lemon juice, garlic, and fresh herbs. Meal planning  Plan to eat 1 vegetarian meal one day each week. Try to work up to 2 vegetarian meals, if possible. Eat seafood 2 or more times a week. Have healthy snacks readily available, such as: Vegetable sticks with hummus. Greek yogurt. Fruit and nut trail mix. Eat balanced meals throughout the week. This includes: Fruit: 2-3 servings a day Vegetables: 4-5 servings a day Low-fat dairy: 2 servings a day Fish, poultry, or lean meat: 1 serving a day Beans and legumes: 2 or more servings a week Nuts and seeds: 1-2 servings a day Whole grains: 6-8 servings a day Extra-virgin olive oil: 3-4 servings a day Limit red meat and sweets to only a few servings a month What are my food choices? Mediterranean diet Recommended Grains: Whole-grain pasta. Brown  rice. Bulgar wheat. Polenta. Couscous. Whole-wheat bread. Dwyane Glad. Vegetables: Artichokes. Beets. Broccoli. Cabbage. Carrots. Eggplant. Green beans. Chard. Kale. Spinach. Onions. Leeks. Peas. Squash. Tomatoes. Peppers. Radishes. Fruits: Apples. Apricots. Avocado. Berries. Bananas. Cherries. Dates. Figs. Grapes. Lemons. Melon. Oranges. Peaches. Plums. Pomegranate. Meats and other protein foods: Beans. Almonds. Sunflower seeds. Pine nuts. Peanuts. Cod. Salmon. Scallops. Shrimp. Tuna. Tilapia. Clams. Oysters. Eggs. Dairy: Low-fat milk. Cheese. Greek yogurt. Beverages: Water. Red wine. Herbal tea. Fats and oils: Extra virgin olive oil. Avocado oil. Grape seed oil. Sweets and desserts: [place] yogurt with honey. Baked apples. Poached pears. Trail mix. Seasoning and other foods: Basil. Cilantro. Coriander. Cumin. Mint. Parsley. Sage. Rosemary. Tarragon. Garlic. Oregano. Thyme. Pepper. Balsalmic vinegar. Tahini. Hummus. Tomato sauce. Olives. Mushrooms. Limit these Grains: Prepackaged pasta or rice dishes. Prepackaged cereal with added sugar. Vegetables: Deep fried potatoes (french fries). Fruits: Fruit canned in syrup. Meats and other protein foods: Beef. Pork. Lamb. Poultry with skin. Hot dogs. Helene Loader. Dairy: Ice cream. Sour cream. Whole milk. Beverages: Juice. Sugar-sweetened soft drinks. Beer. Liquor and spirits. Fats and oils: Butter. Canola oil. Vegetable oil. Beef fat (tallow). Lard. Sweets and desserts: Cookies. Cakes. Pies. Candy.  Seasoning and other foods: Mayonnaise. Premade sauces and marinades. The items listed may not be a complete list. Talk with your dietitian about what dietary choices are right for you. Summary The Mediterranean diet includes both food and lifestyle choices. Eat a variety of fresh fruits and vegetables, beans, nuts, seeds, and whole grains. Limit the amount of red meat and sweets that you eat. Talk with your health care provider about whether it is safe for you  to drink red wine in moderation. This means 1 glass a day for nonpregnant women and 2 glasses a day for men. A glass of wine equals 5 oz (150 mL). This information is not intended to replace advice given to you by your health care provider. Make sure you discuss any questions you have with your health care provider. Document Released: 09/07/2015 Document Revised: 10/10/2015 Document Reviewed: 09/07/2015 Elsevier Interactive Patient Education  2017 [employer].

## 2023-07-22 ENCOUNTER — Telehealth (HOSPITAL_COMMUNITY): Payer: Self-pay | Admitting: *Deleted

## 2023-07-22 NOTE — Telephone Encounter (Signed)
 Writer spoke with pt who called to ask about appointment information as she is a new pt and has never been to this office. Pt also asked name of provider she will be seeing. Pt was advised she will be seeing Dr. Tasia (spelled for her) and location of office provided with landmarks. Pt verbalized understanding.

## 2023-07-23 ENCOUNTER — Ambulatory Visit (HOSPITAL_COMMUNITY): Admitting: Psychiatry

## 2023-07-23 ENCOUNTER — Other Ambulatory Visit: Payer: Self-pay

## 2023-07-23 ENCOUNTER — Encounter (HOSPITAL_COMMUNITY): Payer: Self-pay | Admitting: Psychiatry

## 2023-07-23 VITALS — BP 163/72 | HR 66 | Ht 63.0 in | Wt 196.0 lb

## 2023-07-23 DIAGNOSIS — F320 Major depressive disorder, single episode, mild: Secondary | ICD-10-CM | POA: Diagnosis not present

## 2023-07-23 MED ORDER — MIRTAZAPINE 30 MG PO TABS: 30.0000 mg | ORAL_TABLET | Freq: Every day | ORAL | 4 refills | Status: DC
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
## 2023-08-25 ENCOUNTER — Other Ambulatory Visit (HOSPITAL_COMMUNITY): Payer: Self-pay

## 2023-08-25 MED ORDER — MIRTAZAPINE 30 MG PO TABS: 30.0000 mg | ORAL_TABLET | Freq: Every day | ORAL | 0 refills | Status: DC

## 2023-09-24 ENCOUNTER — Other Ambulatory Visit: Payer: Self-pay

## 2023-09-24 ENCOUNTER — Ambulatory Visit (HOSPITAL_COMMUNITY): Admitting: Psychiatry

## 2023-09-24 ENCOUNTER — Encounter (HOSPITAL_COMMUNITY): Payer: Self-pay | Admitting: Psychiatry

## 2023-09-24 VITALS — BP 167/83 | HR 62 | Ht 63.5 in | Wt 225.0 lb

## 2023-09-24 DIAGNOSIS — F324 Major depressive disorder, single episode, in partial remission: Secondary | ICD-10-CM | POA: Diagnosis not present

## 2023-09-24 MED ORDER — MIRTAZAPINE 30 MG PO TABS: 30.0000 mg | ORAL_TABLET | Freq: Every day | ORAL | 2 refills | Status: AC

## 2023-09-24 NOTE — Progress Notes (Signed)
 Psychiatric Initial Adult Assessment   Patient Identification: Marissa Dodson MRN:  [id number] Date of Evaluation:  09/24/2023 Referral Source: Lauraine Sevin Chief Complaint:  No chief complaint on file.  Visit Diagnosis: Major depression  History of Present Illness:    Today the patient is seen first alone and then with her son stating.  The patient is actually mood.  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  She was essentially 3 hours away in the car.  He has a long family discussion with Marcey and how the patient wants to do start.  She has some friends and some important family members that are in his new community as she wants to be moved to.  The patient no longer is experiencing psychotic symptoms like fumes and paranoia.  She believes that the Remeron  has been helpful.  She is not anxious she is sleeping and eating better.  Associated Signs/Symptoms: Depression Symptoms:  depressed mood, (Hypo) Manic Symptoms:   Anxiety Symptoms:   Psychotic Symptoms:   PTSD Symptoms:   Past Psychiatric History:   Previous Psychotropic Medications: No   Substance Abuse History in the last 12 months:  No.  Consequences of Substance Abuse:   Past Medical History:  Past Medical History:  Diagnosis Date   Acute meniscal injury of left knee    Angioedema 08/16/2019   Complication of anesthesia    hypotention   Constipation    Diverticulitis    Dyspnea    spring needs inhaler   GERD (gastroesophageal reflux disease)    Hypertension    Hypoglycemia, unspecified    IC (interstitial cystitis)    Memory difficulties 07/18/2016   Sleep apnea    STOP-Bang =  5   Swelling of left knee joint     Past Surgical History:  Procedure Laterality Date   HEMORROIDECTOMY  1970's   KNEE ARTHROSCOPY Right 2009   KNEE ARTHROSCOPY Left 10/09/2012   Procedure: LEFT ARTHROSCOPY KNEE WITH medial meiscal DEBRIDEMENT;   Surgeon: Dempsey LULLA Moan, MD;  Location: [facility];  Service: Orthopedics;  Laterality: Left;   LAPAROSCOPIC LYSIS OF ADHESIONS  1980'S   MOUTH SURGERY  12/26/2021   TONSILLECTOMY AND ADENOIDECTOMY  AGE 68   TOTAL ABDOMINAL HYSTERECTOMY W/ BILATERAL SALPINGOOPHORECTOMY  AGE 16   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   Procedure: TOTAL HIP ARTHROPLASTY ANTERIOR APPROACH;  Surgeon: Ernie Cough, MD;  Location: [facility];  Service: Orthopedics;  Laterality: Right;    Family Psychiatric History:   Family History:  Family History  Problem Relation Age of Onset   Heart disease Mother    Alzheimer's disease Mother    Prostate cancer Father    Diabetes Sister    Diabetes Brother    Liver disease Brother    Asthma Grandson    Allergic rhinitis Grandson    Eczema Grandson    Allergic rhinitis Daughter    Allergic rhinitis Daughter    Allergic rhinitis Son    Food Allergy Granddaughter    Immunodeficiency Neg Hx    Urticaria Neg Hx    Angioedema Neg Hx    Atopy Neg Hx     Social History:   Social History   Socioeconomic History   Marital status: Legally Separated    Spouse name: Not on file   Number of children: 3   Years of education: 14   Highest education level: Not  on file  Occupational History   Occupation: Retired  Tobacco Use   Smoking status: Former    Current packs/day: 0.00    Average packs/day: 0.5 packs/day for 20.0 years (10.0 ttl pk-yrs)    Types: Cigarettes    Start date: 10/02/1972    Quit date: 10/02/1992    Years since quitting: 30.9   Smokeless tobacco: Never  Vaping Use   Vaping status: Never Used  Substance and Sexual Activity   Alcohol use: No   Drug use: No   Sexual activity: Not Currently  Other Topics Concern   Not on file  Social History Narrative   Lives alone   Caffeine use: Drinks water and coffee daily   Soda rare   Right handed    Retired Charity fundraiser   Social Drivers of Health   Financial Resource Strain: Medium Risk  (11/05/2018)   Received from [facility] visits prior to 03/30/2022.   Overall Financial Resource Strain (CARDIA)    Difficulty of Paying Living Expenses: Somewhat hard  Food Insecurity: No Food Insecurity (10/11/2021)   Hunger Vital Sign    Worried About Running Out of Food in the Last Year: Never true    Ran Out of Food in the Last Year: Never true  Transportation Needs: No Transportation Needs (10/11/2021)   PRAPARE - Administrator, Civil Service (Medical): No    Lack of Transportation (Non-Medical): No  Physical Activity: Unknown (11/05/2018)   Received from [facility] visits prior to 03/30/2022.   Exercise Vital Sign    On average, how many days per week do you engage in moderate to strenuous exercise (like a brisk walk)?: 0 days    On average, how many minutes do you engage in exercise at this level?: Not asked  Stress: Stress Concern Present (11/05/2018)   Received from [facility] visits prior to 03/30/2022.   [employer] of Occupational Health - Occupational Stress Questionnaire    Feeling of Stress : Rather much  Social Connections: Unknown (11/05/2018)   Received from [facility] visits prior to 03/30/2022.   Social Connection and Isolation Panel    In a typical week, how many times do you talk on the phone with family, friends, or neighbors?: Not asked    How often do you get together with friends or relatives?: Not asked    How often do you attend church or religious services?: Not asked    Do you belong to any clubs or organizations such as church groups, unions, fraternal or athletic groups, or school groups?: Not asked    How often do you attend meetings of the clubs or organizations you belong to?: Not asked    Are you married, widowed, divorced, separated, never married, or living with a partner?: Not asked    Additional Social History:   Allergies:   Allergies  Allergen  Reactions   Ketek [Telithromycin] Shortness Of Breath   Toprol  Xl [Metoprolol ] Other (See Comments)    Caused hands to hurt   Cephalosporins Swelling and Other (See Comments)    Urinary retention   Codeine Itching   Durezol [Difluprednate] Other (See Comments)    Eye redness   Flagyl [Metronidazole] Swelling   Lamictal  [Lamotrigine ] Other (See Comments)    Cannot tolerate per her report   Levaquin [Levofloxacin] Swelling and Other (See Comments)    Headache   Maxzide [Hydrochlorothiazide-Triamterene] Swelling    Low urine  output   Mobic [Meloxicam] Swelling and Other (See Comments)    Low urine output   Motrin [Ibuprofen] Other (See Comments)    Told to avoid due to kidney function   Nsaids Other (See Comments)    Told to avoid due to kidney function   Prednisone Swelling    Low urine output    Rosuvastatin  Other (See Comments)    Cannot tolerate; previous history of statin intolerance   Vistaril [Hydroxyzine] Other (See Comments)    Short term memory loss    Metabolic Disorder Labs: Lab Results  Component Value Date   HGBA1C 5.6 10/11/2021   MPG 114.02 10/11/2021   No results found for: PROLACTIN Lab Results  Component Value Date   CHOL 239 (H) 01/15/2022   TRIG 126 01/15/2022   HDL 71 01/15/2022   CHOLHDL 3.4 01/15/2022   VLDL 16 10/11/2021   LDLCALC 146 (H) 01/15/2022   LDLCALC 114 (H) 10/11/2021   Lab Results  Component Value Date   TSH 1.412 04/03/2023    Therapeutic Level Labs: No results found for: LITHIUM No results found for: CBMZ No results found for: VALPROATE  Current Medications: Current Outpatient Medications  Medication Sig Dispense Refill   acetaminophen  (TYLENOL ) 500 MG tablet Take 500 mg by mouth daily as needed for headache.     albuterol  (VENTOLIN  HFA) 108 (90 Base) MCG/ACT inhaler Inhale 2 puffs into the lungs every 6 (six) hours as needed for wheezing or shortness of breath. 1 each 0   amitriptyline  (ELAVIL ) 50 MG tablet Take  50 mg by mouth at bedtime.     aspirin  EC 81 MG tablet Take 1 tablet (81 mg total) by mouth daily. 30 tablet 3   atenolol  (TENORMIN ) 50 MG tablet Take 1 tablet by mouth daily, may take extra 1/2 dose daily as needed for systolic bp over 859 135 tablet 3   Carboxymethylcellul-Glycerin  (LUBRICATING EYE DROPS OP) Place 1 drop into both eyes daily as needed (dry eyes).     cetirizine (ZYRTEC) 10 MG tablet Take 10 mg by mouth daily.     fluticasone  (FLONASE ) 50 MCG/ACT nasal spray Place 2 sprays into the nose.     hydrochlorothiazide (HYDRODIURIL) 12.5 MG tablet TAKE 1 TABLET BY MOUTH DAILY FOR BLOOD PRESSURE OR SWELLING     omeprazole (PRILOSEC) 20 MG capsule Take 20 mg by mouth daily.     ondansetron  (ZOFRAN ) 4 MG tablet Take 1 tablet (4 mg total) by mouth every 6 (six) hours. 12 tablet 0   polyethylene glycol powder (GLYCOLAX /MIRALAX ) 17 GM/SCOOP powder Take 17 g by mouth daily as needed for mild constipation.     potassium chloride  SA (KLOR-CON  M) 20 MEQ tablet TAKE 1 TABLET(20 MEQ) BY MOUTH DAILY 30 tablet 5   diazepam  (VALIUM ) 2 MG tablet Take 1 tab 30 minutes prior to MRI, may add an additional tab if needed (Patient not taking: Reported on 09/24/2023) 2 tablet 0   furosemide  (LASIX ) 40 MG tablet Take 0.5 tablets (20 mg total) by mouth daily for 3 days. 1/2 tablet (20 mg) every morning, takes the other 1/2 tablet (20 mg) later in the day if needed for swelling (Patient not taking: Reported on 09/24/2023) 3 tablet 0   levETIRAcetam  (KEPPRA ) 500 MG tablet Take 500 mg by mouth 2 (two) times daily.     meclizine  (ANTIVERT ) 25 MG tablet Take 1 tablet (25 mg total) by mouth 3 (three) times daily as needed for dizziness. (Patient not taking: Reported on 09/24/2023)  30 tablet 0   mirtazapine  (REMERON ) 30 MG tablet Take 1 tablet (30 mg total) by mouth at bedtime. 90 tablet 2   No current facility-administered medications for this visit.    Musculoskeletal: Strength & Muscle Tone: within normal  limits Gait & Station: normal Patient leans: Right  Psychiatric Specialty Exam: Review of Systems  Blood pressure (!) 167/83, pulse 62, height 5' 3.5 (1.613 m), weight 225 lb (102.1 kg).Body mass index is 39.23 kg/m.  General Appearance: Casual  Eye Contact:  Good  Speech:  Clear and Coherent  Volume:  Normal  Mood:  Anxious  Affect:  Appropriate  Thought Process:  Goal Directed  Orientation:  Full (Time, Place, and Person)  Thought Content:  Logical  Suicidal Thoughts:  No  Homicidal Thoughts:  No  Memory:  NA  Judgement:  Good  Insight:  Good  Psychomotor Activity:  Normal  Concentration:    Recall:  Good  Fund of Knowledge:Good  Language: Good  Akathisia:  No  Handed:  Right  AIMS (if indicated):  not done  Assets:  Desire for Improvement  ADL's:  Intact  Cognition: WNL  Sleep:  Good   Screenings: Mini-Mental    Flowsheet Row Office Visit from 04/01/2017 in [facility] Office Visit from 07/18/2016 in [facility]  Total Score (max 30 points ) 23 28   Flowsheet Row ED from 04/03/2023 in [facility] at [facility] ED from 03[facility]22/2023 in [facility] Department at [facility] ED from 02/25/2023 in [facility] Department at [facility]  C-SSRS RISK CATEGORY No Risk No Risk No Risk    Assessment and Plan:   At this time we will end our care.  The patient is aware of this and she will attempt to find somebody in her community or ask her primary care physician who can continue her Remeron .  Overall the patient is much better than when I met her.  He shows no evidence of psychosis.  Her thinking is clear organized and connected.  It is clear that through her discussion with her family and particularly with Garnette in the room today that this move is not unreasonable.  She will move with some family members in this new location.  The patient knows that she  can return to see us  if she determines to come back to us .  Actually her plan is that she is going to make another appointment to see me in about 3 months.  If she moves she will probably canceled that appointment.  Collaboration of Care:   Patient/Guardian was advised Release of Information must be obtained prior to any record release in order to collaborate their care with an outside provider. Patient/Guardian was advised if they have not already done so to contact the registration department to sign all necessary forms in order for us  to release information regarding their care.   Consent: Patient/Guardian gives verbal consent for treatment and assignment of benefits for services provided during this visit. Patient/Guardian expressed understanding and agreed to proceed.   Elna LILLETTE Lo, MD 8/27/20254:11 PM

## 2023-11-04 ENCOUNTER — Ambulatory Visit: Payer: Medicare HMO | Admitting: Psychology

## 2023-11-04 ENCOUNTER — Ambulatory Visit: Payer: Self-pay | Admitting: Psychology

## 2023-11-04 ENCOUNTER — Encounter: Payer: Self-pay | Admitting: Psychology

## 2023-11-04 DIAGNOSIS — F332 Major depressive disorder, recurrent severe without psychotic features: Secondary | ICD-10-CM

## 2023-11-04 DIAGNOSIS — F09 Unspecified mental disorder due to known physiological condition: Secondary | ICD-10-CM | POA: Diagnosis not present

## 2023-11-04 DIAGNOSIS — F411 Generalized anxiety disorder: Secondary | ICD-10-CM | POA: Insufficient documentation

## 2023-11-04 DIAGNOSIS — N329 Bladder disorder, unspecified: Secondary | ICD-10-CM | POA: Insufficient documentation

## 2023-11-04 DIAGNOSIS — R4189 Other symptoms and signs involving cognitive functions and awareness: Secondary | ICD-10-CM

## 2023-11-04 DIAGNOSIS — F329 Major depressive disorder, single episode, unspecified: Secondary | ICD-10-CM | POA: Insufficient documentation

## 2023-11-04 NOTE — Progress Notes (Unsigned)
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 - Well Below Average < 2 - Exceptionally Low       Validity:   DESCRIPTOR       DCT: --- --- Within Normal Limits  RBANS EI: --- --- Within Normal Limits       Orientation:      Raw Score Percentile   NAB Orientation, Form 1 29/29 --- ---       Cognitive Screening:      Raw Score Percentile   SLUMS: 22/30 --- ---       RBANS, Form A: Standard Score/ Scaled Score Percentile   Total Score 78 7 Well Below Average  Immediate Memory 78 7 Well Below Average    List Learning 4 2 Well Below Average    Story Memory 8 25 Average  Visuospatial/Constructional 89 23 Below Average    Figure Copy 12 75 Above Average    Line Orientation 10/20 3-9 Well Below Average  Language 64 1 Exceptionally Low    Picture Naming 7/10 <2 Exceptionally Low    Semantic Fluency 4 2 Well Below Average  Attention 82 12 Below Average    Digit Span 9 37 Average    Coding 5 5 Well Below Average  Delayed Memory 101 53 Average    List Recall 4/10 51-75 Average    List Recognition 20/20 51-75 Average    Story Recall 8 25 Average    Story  Recognition 10/12 53-67 Average    Figure Recall 8 25 Average    Figure Recognition 5/8 39-57 Average       Intellectual Functioning:      Standard Score Percentile   Test of Premorbid Functioning: 96 39 Average       Attention/Executive Function:     Trail Making Test (TMT): Raw Score (T Score) Percentile     Part A 59 secs.,  1 error (45) 31 Average    Part B 219 secs.,  3 errors (43) 25 Average         Scaled Score  Percentile   WAIS-5 Coding: 7 16 Below Average  WAIS-5 Naming Speed Quantity: 10 50 Average        Scaled Score Percentile   WAIS-5 Digits Forwards: 10 50 Average  WAIS-5 Digit Sequencing: 7 16 Below Average        Scaled Score Percentile   WAIS-5 Similarities: 6 9 Below Average  WAIS-5 Figure Weights: 9 37 Average       D-KEFS Verbal Fluency Test: Raw Score (Scaled Score) Percentile     Letter Total Correct 31 (10) 50 Average    Category Total Correct 17 (5) 5 Well Below Average    Category Switching Total Correct 8 (7) 16 Below Average    Category Switching Accuracy 6 (7) 16 Below Average      Total Set Loss Errors 1 (11) 63 Average      Total Repetition Errors 2 (11) 63 Average       Language:     Verbal Fluency Test: Raw Score (T Score) Percentile     Phonemic Fluency (FAS) 31 (52) 58 Average    Animal Fluency 9 (43) 25 Average        NAB Language Module, Form 1: T Score Percentile     Auditory Comprehension 56 73 Average    Naming 22/31 (30) 2 Well Below Average       Visuospatial/Visuoconstruction:      Raw Score Percentile   Clock Drawing: 10/10 --- Within Normal Limits        Scaled Score Percentile   WAIS-5 Block Design: 11 63 Average       Mood and Personality:      Raw Score Percentile   Geriatric Depression Scale: 21 --- Severe  Geriatric Anxiety Scale: 18 --- Mild    Somatic 8 --- Mild    Cognitive 4 --- Mild    Affective 6 --- Mild       Additional Questionnaires:      Raw Score Percentile   PROMIS Sleep Disturbance  Questionnaire: 32 --- Moderate   Informed Consent and Coding/Compliance:   The current evaluation represents a clinical evaluation for the purposes previously outlined by the referral source and is in no way reflective of a forensic evaluation.   Ms. Molenda was provided with a verbal description of the nature and purpose of the present neuropsychological evaluation. Also reviewed were the foreseeable risks and/or discomforts and benefits of the procedure, limits of confidentiality, and mandatory reporting requirements of this provider. The patient was given the opportunity to ask questions and receive answers about the evaluation. Oral consent to participate was provided by the patient.   This evaluation was conducted by Arthea KYM Maryland, Ph.D., ABPP-CN, board certified clinical neuropsychologist. Ms. Mcconahy completed a clinical interview with Dr. Maryland, billed as one unit [phone], and 135 minutes of cognitive testing and scoring, billed as one unit [phone] and three additional units [id number]. Psychometrist Lonell Jude, B.S. assisted Dr. Maryland with test administration and scoring procedures. As a separate and discrete service, one unit [phone] and two units 96133 (162 minutes) were billed for Dr. Loralee time spent in interpretation and report writing.

## 2023-11-04 NOTE — Progress Notes (Signed)
   Psychometrician Note   Cognitive testing was administered to Marissa Dodson by Marissa Dodson, B.S. (psychometrist) under the supervision of Dr. Arthea KYM Dodson, Ph.D., ABPP, licensed psychologist on 11/04/2023. Marissa Dodson did not appear overtly distressed by the testing session per behavioral observation or responses across self-report questionnaires. Rest breaks were offered.   The battery of tests administered was selected by Dr. Zachary C. Dodson, Ph.D., ABPP with consideration to Marissa Dodson's current level of functioning, the nature of her symptoms, emotional and behavioral responses during interview, level of literacy, observed level of motivation/effort, and the nature of the referral question. This battery was communicated to the psychometrist. Communication between Dr. Arthea KYM Dodson, Ph.D., ABPP and the psychometrist was ongoing throughout the evaluation and Dr. Arthea KYM Dodson, Ph.D., ABPP was immediately accessible at all times. Dr. Zachary C. Dodson, Ph.D., ABPP provided supervision to the psychometrist on the date of this service to the extent necessary to assure the quality of all services provided.    Marissa Dodson will return within approximately 1-2 weeks for an interactive feedback session with Dr. Maryland at which time her test performances, clinical impressions, and treatment recommendations will be reviewed in detail. Marissa Dodson understands she can contact our office should she require our assistance before this time.  A total of 135 minutes of billable time were spent face-to-face with Marissa Dodson by the psychometrist. This includes both test administration and scoring time. Billing for these services is reflected in the clinical report generated by Dr. Arthea KYM Dodson, Ph.D., ABPP  This note reflects time spent with the psychometrician and does not include test scores or any clinical interpretations made by Dr. Maryland. The full report will follow in a separate note.

## 2023-11-07 ENCOUNTER — Emergency Department (HOSPITAL_COMMUNITY)
Admission: EM | Admit: 2023-11-07 | Discharge: 2023-11-07 | Disposition: A | Discharge status: Home / Self Care | Attending: Emergency Medicine | Admitting: Emergency Medicine

## 2023-11-07 ENCOUNTER — Emergency Department (HOSPITAL_COMMUNITY)

## 2023-11-07 DIAGNOSIS — R531 Weakness: Secondary | ICD-10-CM | POA: Diagnosis not present

## 2023-11-07 DIAGNOSIS — I509 Heart failure, unspecified: Secondary | ICD-10-CM | POA: Insufficient documentation

## 2023-11-07 DIAGNOSIS — R0602 Shortness of breath: Secondary | ICD-10-CM | POA: Diagnosis present

## 2023-11-07 DIAGNOSIS — N1832 Chronic kidney disease, stage 3b: Secondary | ICD-10-CM | POA: Insufficient documentation

## 2023-11-07 DIAGNOSIS — I130 Hypertensive heart and chronic kidney disease with heart failure and stage 1 through stage 4 chronic kidney disease, or unspecified chronic kidney disease: Secondary | ICD-10-CM | POA: Diagnosis not present

## 2023-11-07 DIAGNOSIS — Z8673 Personal history of transient ischemic attack (TIA), and cerebral infarction without residual deficits: Secondary | ICD-10-CM | POA: Diagnosis not present

## 2023-11-07 DIAGNOSIS — Z79899 Other long term (current) drug therapy: Secondary | ICD-10-CM | POA: Diagnosis not present

## 2023-11-07 DIAGNOSIS — R059 Cough, unspecified: Secondary | ICD-10-CM | POA: Insufficient documentation

## 2023-11-07 DIAGNOSIS — Z7982 Long term (current) use of aspirin: Secondary | ICD-10-CM | POA: Diagnosis not present

## 2023-11-07 LAB — URINALYSIS, W/ REFLEX TO CULTURE (INFECTION SUSPECTED)
Bacteria, UA: NONE SEEN
Bilirubin Urine: NEGATIVE
Glucose, UA: NEGATIVE mg/dL
Hgb urine dipstick: NEGATIVE
Ketones, ur: NEGATIVE mg/dL
Leukocytes,Ua: NEGATIVE
Nitrite: NEGATIVE
Protein, ur: NEGATIVE mg/dL
Specific Gravity, Urine: 1.003 — ABNORMAL LOW (ref 1.005–1.030)
pH: 6 (ref 5.0–8.0)

## 2023-11-07 LAB — CBC
HCT: 45.5 % (ref 36.0–46.0)
Hemoglobin: 14.1 g/dL (ref 12.0–15.0)
MCH: 28.3 pg (ref 26.0–34.0)
MCHC: 31 g/dL (ref 30.0–36.0)
MCV: 91.2 fL (ref 80.0–100.0)
Platelets: 303 K/uL (ref 150–400)
RBC: 4.99 MIL/uL (ref 3.87–5.11)
RDW: 12.6 % (ref 11.5–15.5)
WBC: 9 K/uL (ref 4.0–10.5)
nRBC: 0 % (ref 0.0–0.2)

## 2023-11-07 LAB — URINE DRUG SCREEN
Amphetamines: NEGATIVE
Barbiturates: NEGATIVE
Benzodiazepines: NEGATIVE
Cocaine: NEGATIVE
Fentanyl: NEGATIVE
Methadone Scn, Ur: NEGATIVE
Opiates: NEGATIVE
Tetrahydrocannabinol: NEGATIVE

## 2023-11-07 LAB — BASIC METABOLIC PANEL WITH GFR
Anion gap: 9 (ref 5–15)
BUN: 10 mg/dL (ref 8–23)
CO2: 29 mmol/L (ref 22–32)
Calcium: 9.8 mg/dL (ref 8.9–10.3)
Chloride: 97 mmol/L — ABNORMAL LOW (ref 98–111)
Creatinine, Ser: 0.95 mg/dL (ref 0.44–1.00)
GFR, Estimated: 59 mL/min — ABNORMAL LOW (ref >60)
Glucose, Bld: 99 mg/dL (ref 70–99)
Potassium: 4 mmol/L (ref 3.5–5.1)
Sodium: 135 mmol/L (ref 135–145)

## 2023-11-07 LAB — RESP PANEL BY RT-PCR (RSV, FLU A&B, COVID)  RVPGX2
Influenza A by PCR: NEGATIVE
Influenza B by PCR: NEGATIVE
Resp Syncytial Virus by PCR: NEGATIVE
SARS Coronavirus 2 by RT PCR: NEGATIVE

## 2023-11-07 LAB — PROTIME-INR
INR: 0.9 (ref 0.8–1.2)
Prothrombin Time: 13.1 s (ref 11.4–15.2)

## 2023-11-07 LAB — APTT: aPTT: 26 s (ref 24–36)

## 2023-11-07 LAB — TROPONIN T, HIGH SENSITIVITY: Troponin T High Sensitivity: <15 ng/L (ref 0–19)

## 2023-11-07 LAB — CBG MONITORING, ED: Glucose-Capillary: 83 mg/dL (ref 70–99)

## 2023-11-07 LAB — ETHANOL: Alcohol, Ethyl (B): <15 mg/dL (ref <15)

## 2023-11-07 LAB — PRO BRAIN NATRIURETIC PEPTIDE: Pro Brain Natriuretic Peptide: 102 pg/mL (ref <300.0)

## 2023-11-07 MED ORDER — LORAZEPAM 2 MG/ML IJ SOLN
1.0000 mg | Freq: Once | INTRAMUSCULAR | Status: AC
  Administered 2023-11-07: 1 mg via INTRAVENOUS
  Filled 2023-11-07: qty 1

## 2023-11-07 MED ORDER — SODIUM CHLORIDE 0.9 % IV BOLUS: 500.0000 mL | Freq: Once | INTRAVENOUS | Status: DC

## 2023-11-07 MED ORDER — LEVETIRACETAM 500 MG PO TABS
500.0000 mg | ORAL_TABLET | Freq: Once | ORAL | Status: AC
  Administered 2023-11-07: 500 mg via ORAL
  Filled 2023-11-07: qty 1

## 2023-11-07 NOTE — Discharge Instructions (Addendum)
 Thank you for coming to [facility] Emergency Department. You were seen for generalized weakness, shortness of breath, fatigue. We did an exam, labs, and imaging, and these showed no acute findings.  You had labs including urine and imaging including an MRI of your brain that were very reassuring.  You were able to walk in the department without difficulty.  Your symptoms improved.  Please follow-up with your primary care provider within 1 week.  Please return for any new or worsening symptoms.  Do not hesitate to return to the ED or call 911 if you experience: -Worsening symptoms -Chest pain -Strokelike symptoms including asymmetric weakness numbness tingling, slurred speech, facial droop, confusion, visual changes, severe headache -Lightheadedness, passing out -Fevers/chills -Anything else that concerns you

## 2023-11-07 NOTE — ED Triage Notes (Signed)
 Patient became unable to formulate words in triage. Patient moved to room with tele stroke machine

## 2023-11-07 NOTE — ED Triage Notes (Signed)
 Patient c/o weakness on the left side, headache, and SOB for the last week. Patient appears to be unsure of symptoms and is a poor historian. Unable to tell if she has a lack of sensation on one side vs the other. States she woke up sweating a week ago and hasn't been able to get in enough water.

## 2023-11-07 NOTE — ED Provider Notes (Signed)
 4:52 PM Assumed care of patient from off-going team. For more details, please see note from same day.  In brief, this is a p/w not feeling well. SOB, fatigue nauseated, chest tightness since Tuesday. 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 Patient noted left hand numbness today. She felt like she had some dysarthria this morning but her speech is okay here. Recently seen by neuropsych on 11/04/23 for cognitive decline.   Plan/Dispo at time of sign-out & ED Course since sign-out: [ ]  MRI  BP (!) 180/81 (BP Location: Right Arm)   Pulse (!) 54   Temp 98.2 F (36.8 C) (Oral)   Resp 19   SpO2 98%    ED Course:   Clinical Course as of 11/07/23 1844  Fri Nov 07, 2023  1747 MR BRAIN WO CONTRAST 1. No evidence of acute intracranial abnormality. [HN]  1843 Patient reevaluated.  She ambulated in the department without difficulty.  On reevaluation patient states she feels improved.  She has an extensive workup that is negative including respiratory panel, UDS, UA, EtOH, proBNP, PT/INR, PTT, CBC, BMP, and CBG.  Also had a reassuring CT head, chest x-ray, and MRI brain without contrast.  I discussed this with patient and her son.  She is establishing with a new primary care physician in [place] this week.  Advised that she keep that appointment and stay well-hydrated at home and monitor her symptoms.  She states she feels improved and is ready to be discharged.  Advised that she return for any new or worsening symptoms.  Patient and her son both report understanding. [HN]    Clinical Course User Index [HN] Franklyn Sid SAILOR, MD    Dispo: DC w/ discharge instructions/return precautions. All questions answered to patient's satisfaction.   ------------------------------- Sid Franklyn, MD Emergency Medicine  This note was created using dictation software, which may contain spelling or grammatical errors.   Franklyn Sid SAILOR, MD 11/07/23 [phone]

## 2023-11-07 NOTE — ED Provider Notes (Signed)
 [place] EMERGENCY DEPARTMENT AT [facility] Provider Note   CSN: [id number] Arrival date & time: 11/07/23  1203     Patient presents with: Possible Stroke   Marissa Dodson is a 83 y.o. female.   Patient is a 83 year old female with a history of hypertension, seizures, TIA who presents with multiple complaints.  She says she has not been feeling well since Tuesday.  She went to have a neurologic evaluation on Tuesday due to some cognitive deficits.  She said when she got home she was feeling tired and went to sleep.  Since that time she has felt weaker than normal.  She felt a little worse this morning.  She woke up and noticed that she was weaker and she was having some dizziness.  She does not report any spinning sensation.  She thought she noticed a little bit of drooping of the left side of her face this morning.  When she was reading her Bible, she noticed that she could not pronounce some of the words that she normally can pronounce.  She does read aloud.  She woke up during the night and noticed that her left hand felt numb.  She still has some of that sensation.  She denies any weakness in the arms or legs other than feeling weak all over.  No vision changes.  She has been having little bit of a coughing although it is nonproductive.  A little bit of rhinorrhea.  She does report some increase in her chest tightness.  She has intermittent tightness in her chest which is not terribly uncommon for her but she says she has had increased episodes and been feeling more short of breath than normal.  She also has been sweating more than normal over the last few days.  No known fevers.  No vomiting or diarrhea although she has had some nausea.  No abdominal pain.  She said she did have a prior stroke in 2023 and felt many of the same symptoms although she cannot tell me exactly what symptoms she was having with the stroke and if she has any residual deficits.  On chart review, by  neurology notes, she had a TIA in 2023 which manifested with transient dysarthria.       Prior to Admission medications   Medication Sig Start Date End Date Taking? Authorizing Provider  acetaminophen  (TYLENOL ) 500 MG tablet Take 500 mg by mouth daily as needed for headache.    [provider]  albuterol  (VENTOLIN  HFA) 108 (90 Base) MCG/ACT inhaler Inhale 2 puffs into the lungs every 6 (six) hours as needed for wheezing or shortness of breath. 04/03/23   Ula Prentice SAUNDERS, MD  amitriptyline  (ELAVIL ) 50 MG tablet Take 50 mg by mouth at bedtime.    [provider]  aspirin  EC 81 MG tablet Take 1 tablet (81 mg total) by mouth daily. 10/13/21   Drusilla Sabas GORMAN, MD  atenolol  (TENORMIN ) 50 MG tablet Take 1 tablet by mouth daily, may take extra 1/2 dose daily as needed for systolic bp over 859 01/31/22   Claudene Victory ORN, MD  Carboxymethylcellul-Glycerin  (LUBRICATING EYE DROPS OP) Place 1 drop into both eyes daily as needed (dry eyes).    [provider]  cetirizine (ZYRTEC) 10 MG tablet Take 10 mg by mouth daily.    [provider]  diazepam  (VALIUM ) 2 MG tablet Take 1 tab 30 minutes prior to MRI, may add an additional tab if needed Patient not taking:  Reported on 09/24/2023 04/10/23   Wertman, Sara E, PA-C  fluticasone  (FLONASE ) 50 MCG/ACT nasal spray Place 2 sprays into the nose. 05/24/19   [provider]  furosemide  (LASIX ) 40 MG tablet Take 0.5 tablets (20 mg total) by mouth daily for 3 days. 1/2 tablet (20 mg) every morning, takes the other 1/2 tablet (20 mg) later in the day if needed for swelling Patient not taking: Reported on 09/24/2023 03/22/23 03/25/23  Curatolo, Adam, DO  hydrochlorothiazide (HYDRODIURIL) 12.5 MG tablet TAKE 1 TABLET BY MOUTH DAILY FOR BLOOD PRESSURE OR SWELLING 01/09/23   [provider]  levETIRAcetam  (KEPPRA ) 500 MG tablet Take 500 mg by mouth 2 (two) times daily.    [provider]  meclizine  (ANTIVERT ) 25 MG tablet Take 1  tablet (25 mg total) by mouth 3 (three) times daily as needed for dizziness. Patient not taking: Reported on 09/24/2023 12/21/21   Kingsley, Victoria K, DO  mirtazapine  (REMERON ) 30 MG tablet Take 1 tablet (30 mg total) by mouth at bedtime. 09/24/23 09/23/24  Plovsky, Elna, MD  omeprazole (PRILOSEC) 20 MG capsule Take 20 mg by mouth daily. 07/17/16   [provider]  ondansetron  (ZOFRAN ) 4 MG tablet Take 1 tablet (4 mg total) by mouth every 6 (six) hours. 12/21/21   Kingsley, Victoria K, DO  polyethylene glycol powder (GLYCOLAX /MIRALAX ) 17 GM/SCOOP powder Take 17 g by mouth daily as needed for mild constipation. 08/30/20   [provider]  potassium chloride  SA (KLOR-CON  M) 20 MEQ tablet TAKE 1 TABLET(20 MEQ) BY MOUTH DAILY 08/06/22   Sebastian Lamarr SAUNDERS, PA-C    Allergies: Ketek [telithromycin], Toprol  xl [metoprolol ], Cephalosporins, Codeine, Durezol [difluprednate], Flagyl [metronidazole], Lamictal  [lamotrigine ], Levaquin [levofloxacin], Maxzide [hydrochlorothiazide-triamterene], Mobic [meloxicam], Motrin [ibuprofen], Nsaids, Prednisone, Rosuvastatin , and Vistaril [hydroxyzine]    Review of Systems  Constitutional:  Positive for fatigue. Negative for chills, diaphoresis and fever.  HENT:  Positive for rhinorrhea. Negative for congestion and sneezing.   Eyes: Negative.   Respiratory:  Positive for cough, chest tightness and shortness of breath.   Cardiovascular:  Negative for leg swelling.  Gastrointestinal:  Positive for nausea. Negative for abdominal pain, blood in stool, diarrhea and vomiting.  Genitourinary:  Negative for difficulty urinating, flank pain, frequency and hematuria.  Musculoskeletal:  Negative for arthralgias and back pain.  Skin:  Negative for rash.  Neurological:  Positive for facial asymmetry, weakness (Generalized) and numbness (Numbness to left hand). Negative for dizziness, speech difficulty and headaches.    Updated Vital Signs BP (!) 180/81 (BP  Location: Right Arm)   Pulse (!) 54   Temp 98.2 F (36.8 C) (Oral)   Resp 19   SpO2 98%   Physical Exam Constitutional:      Appearance: She is well-developed.  HENT:     Head: Normocephalic and atraumatic.  Eyes:     Pupils: Pupils are equal, round, and reactive to light.  Cardiovascular:     Rate and Rhythm: Normal rate and regular rhythm.     Heart sounds: Normal heart sounds.  Pulmonary:     Effort: Pulmonary effort is normal. No respiratory distress.     Breath sounds: Normal breath sounds. No wheezing or rales.  Chest:     Chest wall: No tenderness.  Abdominal:     General: Bowel sounds are normal.     Palpations: Abdomen is soft.     Tenderness: There is no abdominal tenderness. There is no guarding or rebound.  Musculoskeletal:  General: Normal range of motion.     Cervical back: Normal range of motion and neck supple.  Lymphadenopathy:     Cervical: No cervical adenopathy.  Skin:    General: Skin is warm and dry.     Findings: No rash.  Neurological:     Mental Status: She is alert and oriented to person, place, and time.     Comments: Motor 5 out of 5 all extremities, sensation grossly intact to light touch all extremities although she says it is a little bit diminished in her left hand as compared to her right, no obvious facial drooping, no pronator drift     (all labs ordered are listed, but only abnormal results are displayed) Labs Reviewed  BASIC METABOLIC PANEL WITH GFR - Abnormal; Notable for the following components:      Result Value   Chloride 97 (*)    GFR, Estimated 59 (*)    All other components within normal limits  URINALYSIS, W/ REFLEX TO CULTURE (INFECTION SUSPECTED) - Abnormal; Notable for the following components:   Color, Urine STRAW (*)    Specific Gravity, Urine 1.003 (*)    All other components within normal limits  RESP PANEL BY RT-PCR (RSV, FLU A&B, COVID)  RVPGX2  CBC  PROTIME-INR  APTT  ETHANOL  URINE DRUG SCREEN  PRO  BRAIN NATRIURETIC PEPTIDE  CBG MONITORING, ED  TROPONIN T, HIGH SENSITIVITY    EKG: EKG Interpretation Date/Time:  Friday November 07 2023 13:13:48 EDT Ventricular Rate:  52 PR Interval:  183 QRS Duration:  96 QT Interval:  443 QTC Calculation: 412 R Axis:   -19  Text Interpretation: Sinus rhythm Borderline left axis deviation Low voltage, precordial leads since last tracing no significant change Confirmed by Lenor Hollering [phone]) on 11/07/2023 1:25:25 PM  Radiology: CT Head Wo Contrast Result Date: 11/07/2023 EXAM: CT HEAD WITHOUT CONTRAST 11/07/2023 02:11:27 PM TECHNIQUE: CT of the head was performed without the administration of intravenous contrast. Automated exposure control, iterative reconstruction, and/or weight based adjustment of the mA/kV was utilized to reduce the radiation dose to as low as reasonably achievable. COMPARISON: MR head 02/10/2023 and CT head without contrast 04/03/2023. CLINICAL HISTORY: Neuro deficit, acute, stroke suspected. Patient c/o weakness on the left side, headache, and SOB for the last week. Patient appears to be unsure of symptoms and is a poor historian. Unable to tell if she has a lack of sensation on one side vs the other. States she woke up sweating a week ago and hasn't been able to get in enough water. FINDINGS: BRAIN AND VENTRICLES: No acute hemorrhage. No evidence of acute infarct. No hydrocephalus. No extra-axial collection. No mass effect or midline shift. ORBITS: No acute abnormality. SINUSES: No acute abnormality. SOFT TISSUES AND SKULL: No acute soft tissue abnormality. No skull fracture. IMPRESSION: 1. No acute intracranial abnormality. Electronically signed by: Lonni Necessary MD 11/07/2023 02:14 PM EDT RP Workstation: [id number]   DG Chest 2 View Result Date: 11/07/2023 EXAM: 2 VIEW(S) XRAY OF THE CHEST 11/07/2023 12:43:50 PM COMPARISON: 03/22/2023 CLINICAL HISTORY: sob. Per chart: Patient c/o weakness on the left side, headache, and  SOB for the last week. FINDINGS: LUNGS AND PLEURA: No focal pulmonary opacity. No pulmonary edema. No pleural effusion. No pneumothorax. HEART AND MEDIASTINUM: No acute abnormality of the cardiac and mediastinal silhouettes. BONES AND SOFT TISSUES: No acute osseous abnormality. IMPRESSION: 1. No acute cardiopulmonary process. Electronically signed by: Lynwood Seip MD 11/07/2023 01:23 PM EDT RP Workstation: [id number]  Procedures   Medications Ordered in the ED  levETIRAcetam  (KEPPRA ) tablet 500 mg (has no administration in time range)  LORazepam  (ATIVAN ) injection 1 mg (has no administration in time range)  sodium chloride  0.9 % bolus 500 mL (has no administration in time range)                                    Medical Decision Making Amount and/or Complexity of Data Reviewed Labs: ordered. Radiology: ordered.  Risk Prescription drug management.   This patient presents to the ED for concern of weakness shortness of breath, this involves an extensive number of treatment options, and is a complaint that carries with it a high risk of complications and morbidity.  I considered the following differential and admission for this acute, potentially life threatening condition.  The differential diagnosis includes stroke, infection/UTI, pneumonia, ACS, aortic dissection, dehydration, pulmonary edema  MDM:    Patient is 83 year old who presents with vague complaints.  Overall she has had some weakness and nausea for the last 4 days.  This morning she noticed that she was having trouble pronouncing words that she normally could pronounce.  She question whether she had a little bit of left facial drooping.  She has had some intermittent shortness of breath and chest tightness.  Currently she is neurologically intact.  She possibly has a little diminished sensation in the left hand as compared to the right but she does have sensation on both sides.  Her speech is fluent.  Every now and then she  will hesitate with the word but generally her speech is normal.  EKG does not show any ischemic changes.  Her troponin is negative.  She does not have any suggestions of fluid overload.  Her BNP is normal.  Chest x-ray does not show any evidence of pulmonary edema or pneumonia.  She does not have other symptoms that would be more concerning for PE.  She has no hypoxia or increased work of breathing.  Urinalysis is not concerning for infection.  MRI pending.  Care turned over to Dr. Franklyn pending MRI  (Labs, imaging, consults)  Labs: I Ordered, and personally interpreted labs.  The pertinent results include: Nonconcerning, urine not concerning for infection, troponin negative, normal creatinine, no anemia  Imaging Studies ordered: I ordered imaging studies including CT head, chest x-ray, MRI brain I independently visualized and interpreted imaging. I agree with the radiologist interpretation  Additional history obtained from son.  External records from outside source obtained and reviewed including history  Cardiac Monitoring: The patient was maintained on a cardiac monitor.  If on the cardiac monitor, I personally viewed and interpreted the cardiac monitored which showed an underlying rhythm of: Sinus rhythm  Reevaluation: After the interventions noted above, I reevaluated the patient and found that they have :improved  Social Determinants of Health:    Disposition:  pending  Co morbidities that complicate the patient evaluation  Past Medical History:  Diagnosis Date   Acute meniscal injury of left knee    Acute upper respiratory infection 03/06/2013   Age-related osteoporosis without current pathological fracture 05/25/2021   Allergic rhinitis 06/16/2017   Last Assessment & Plan:    She will continue on Zyrtec 10 mg at bedtime.     Angioedema 08/16/2019   Bladder disorder, unspecified    Bradycardia 10/23/2021   Carotid artery disease 10/23/2021   Chronic constipation  10/16/2017  Last Assessment & Plan:    She is going to try mixing plum/prune vs apple juice in 17 g of Miralax  (1 cap) daily.  She will drink at least 32 oz of water per day.  She will increase her intake of fiber to 30 grams (Fiber One).     Chronic interstitial cystitis 07/04/2012   Chronic kidney disease, stage 3b 02/08/2022   Complication of anesthesia    hypotension   Coronary artery calcification 10/23/2021   Diverticulitis of colon 04/14/2013   Dyspnea    spring needs inhaler   Dysuria 11/29/2022   Edema 10/16/2012   Essential hypertension 10/16/2012   Last Assessment & Plan:   She was given refills for atenolol .   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     Generalized abdominal pain 08/14/2012   Generalized anxiety disorder    Hardening of the aorta (main artery of the heart) 05/25/2021   Heart failure, unspecified 11/19/2021   Hematochezia 04/14/2013   Herpes infection 06/16/2017   Last Assessment & Plan:    She was given refills for Valtrex.     Hyperlipidemia 05/16/2019   Hypoglycemia, unspecified    Insomnia 10/02/2012   Lumbar radiculopathy 09/23/2016   Major depressive disorder    Nonintractable headache 09/15/2017   Last Assessment & Plan:    She will try some Tylenol  for her headache.     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   Senile nuclear sclerosis 02/11/2017   Severe obesity     Swelling of left knee joint    TIA (transient ischemic attack)    Trochanteric bursitis of left hip 08/05/2021     Medicines Meds ordered this encounter  Medications   levETIRAcetam  (KEPPRA ) tablet 500 mg   LORazepam  (ATIVAN ) injection 1 mg   sodium chloride  0.9 % bolus 500 mL    I have reviewed the patients home medicines and have made adjustments as needed  Problem List / ED Course: Problem List Items Addressed This Visit       Other   Dyspnea   Other Visit Diagnoses       Weakness    -  Primary                Final diagnoses:  Weakness  Shortness of breath    ED Discharge Orders     None          Lenor Hollering, MD 11/07/23 1627

## 2023-11-11 ENCOUNTER — Ambulatory Visit: Payer: Medicare HMO | Admitting: Psychology

## 2023-11-11 DIAGNOSIS — F332 Major depressive disorder, recurrent severe without psychotic features: Secondary | ICD-10-CM | POA: Diagnosis not present

## 2023-11-11 DIAGNOSIS — F411 Generalized anxiety disorder: Secondary | ICD-10-CM | POA: Diagnosis not present

## 2023-11-11 NOTE — Progress Notes (Signed)
   Neuropsychology Feedback Session Marissa Dodson. [facility] of Neurology  Reason for Referral:   Marissa Dodson is a 83 y.o. right-handed African-American female referred by Camie Sevin, PA-C, to characterize her current cognitive functioning and assist with diagnostic clarity and treatment planning in the context of subjective cognitive decline.   Feedback:   [name] completed a comprehensive neuropsychological evaluation on 11/04/2023. Please refer to that encounter for the full report and recommendations. Briefly, results suggested an isolated impairment across confrontation naming. Further performance variability was exhibited across processing speed, semantic fluency, visuospatial abilities, and encoding (i.e., learning) aspects of verbal memory. Despite some mild cognitive weaknesses across testing, I do not feel that [name] warrants a formal mild neurocognitive disorder diagnosis at the present time. This diagnosis requires an underlying neurological cause for ongoing dysfunction. However, it remains plausible that [name]'s difficulties are primarily driven by psychiatric factors. During interview, [name] readily acknowledged that prominent anxiety directly, and dramatically at times, impairs her cognitive abilities. Across mood related questionnaires, she described acute symptoms of mild anxiety and severe depression. Significant psychiatric distress can certainly impact processing speed and aspects of both expressive language and learning aspects of memory. These domains can further be impacted by chronic medical conditions and ongoing sleep dysfunction (including untreated sleep apnea). Her weaknesses surrounding semantic fluency and confrontation naming also appear more longstanding in nature as they were present during her previous evaluation in 2018 and have remained unchanged over time. Overall, ongoing psychiatric distress combined with chronic  medical comorbidities appears the most likely culprit for testing patterns at the present time. Memory performances combined with intact performances across other areas of cognitive functioning is not suggestive of presently symptomatic Alzheimer's disease.   [name] was accompanied by her son during the current feedback session. Content of the current session focused on the results of her neuropsychological evaluation. [name] was given the opportunity to ask questions and her questions were answered. She was encouraged to reach out should additional questions arise. A copy of her report was provided at the conclusion of the visit.      One unit 96132 (32 minutes) was billed for Dr. Loralee time spent preparing for, conducting, and documenting the current feedback session with [name].

## 2023-12-17 ENCOUNTER — Ambulatory Visit (HOSPITAL_COMMUNITY): Admitting: Psychiatry

## 2024-04-05 ENCOUNTER — Ambulatory Visit: Payer: Self-pay | Admitting: Physician Assistant
